# Patient Record
Sex: Female | Born: 1955 | Hispanic: No | Marital: Married | Smoking: Never smoker
Health system: Southern US, Community
[De-identification: ages and names within clinical notes are randomized; demographics above are authoritative.]

## PROBLEM LIST (undated history)

## (undated) DIAGNOSIS — E119 Type 2 diabetes mellitus without complications: Secondary | ICD-10-CM

## (undated) DIAGNOSIS — I10 Essential (primary) hypertension: Secondary | ICD-10-CM

## (undated) DIAGNOSIS — K7460 Unspecified cirrhosis of liver: Secondary | ICD-10-CM

## (undated) DIAGNOSIS — G4730 Sleep apnea, unspecified: Secondary | ICD-10-CM

## (undated) DIAGNOSIS — M1990 Unspecified osteoarthritis, unspecified site: Secondary | ICD-10-CM

## (undated) DIAGNOSIS — K589 Irritable bowel syndrome without diarrhea: Secondary | ICD-10-CM

## (undated) HISTORY — PX: PARTIAL HYSTERECTOMY: SHX80

## (undated) HISTORY — PX: TOTAL KNEE ARTHROPLASTY: SHX125

## (undated) HISTORY — PX: SHOULDER ARTHROTOMY: SUR111

---

## 1999-07-23 ENCOUNTER — Encounter: Payer: Self-pay | Admitting: Emergency Medicine

## 1999-07-23 ENCOUNTER — Emergency Department (HOSPITAL_COMMUNITY): Admission: EM | Admit: 1999-07-23 | Discharge: 1999-07-23 | Payer: Self-pay | Admitting: Emergency Medicine

## 2001-08-15 ENCOUNTER — Emergency Department (HOSPITAL_COMMUNITY): Admission: EM | Admit: 2001-08-15 | Discharge: 2001-08-15 | Payer: Self-pay | Admitting: Emergency Medicine

## 2001-08-15 ENCOUNTER — Encounter: Payer: Self-pay | Admitting: Emergency Medicine

## 2001-09-01 ENCOUNTER — Emergency Department (HOSPITAL_COMMUNITY): Admission: EM | Admit: 2001-09-01 | Discharge: 2001-09-01 | Payer: Self-pay | Admitting: Emergency Medicine

## 2002-08-08 ENCOUNTER — Observation Stay (HOSPITAL_COMMUNITY): Admission: RE | Admit: 2002-08-08 | Discharge: 2002-08-09 | Payer: Self-pay | Admitting: Urology

## 2007-09-28 ENCOUNTER — Emergency Department (HOSPITAL_COMMUNITY): Admission: EM | Admit: 2007-09-28 | Discharge: 2007-09-28 | Payer: Self-pay | Admitting: Family Medicine

## 2008-11-19 ENCOUNTER — Emergency Department (HOSPITAL_BASED_OUTPATIENT_CLINIC_OR_DEPARTMENT_OTHER): Admission: EM | Admit: 2008-11-19 | Discharge: 2008-11-19 | Payer: Self-pay | Admitting: Emergency Medicine

## 2009-08-14 ENCOUNTER — Emergency Department (HOSPITAL_COMMUNITY): Admission: EM | Admit: 2009-08-14 | Discharge: 2009-08-14 | Payer: Self-pay | Admitting: Emergency Medicine

## 2009-08-15 ENCOUNTER — Emergency Department (HOSPITAL_COMMUNITY): Admission: EM | Admit: 2009-08-15 | Discharge: 2009-08-15 | Payer: Self-pay | Admitting: Family Medicine

## 2010-09-23 LAB — COMPREHENSIVE METABOLIC PANEL
AST: 40 U/L — ABNORMAL HIGH (ref 0–37)
Albumin: 3.8 g/dL (ref 3.5–5.2)
BUN: 16 mg/dL (ref 6–23)
Calcium: 8.8 mg/dL (ref 8.4–10.5)
Creatinine, Ser: 1.08 mg/dL (ref 0.4–1.2)
GFR calc Af Amer: >60 mL/min (ref >60)
Total Bilirubin: 3.8 mg/dL — ABNORMAL HIGH (ref 0.3–1.2)
Total Protein: 7.3 g/dL (ref 6.0–8.3)

## 2010-09-23 LAB — CBC
HCT: 37.2 % (ref 36.0–46.0)
Hemoglobin: 12.9 g/dL (ref 12.0–15.0)
MCHC: 34.7 g/dL (ref 30.0–36.0)
MCV: 79 fL (ref 78.0–100.0)
Platelets: 67 10*3/uL — ABNORMAL LOW (ref 150–400)
RBC: 4.71 MIL/uL (ref 3.87–5.11)
RDW: 14.6 % (ref 11.5–15.5)
WBC: 8.5 10*3/uL (ref 4.0–10.5)

## 2010-09-23 LAB — URINE CULTURE: Colony Count: >=100000

## 2010-09-23 LAB — POCT I-STAT, CHEM 8
BUN: 17 mg/dL (ref 6–23)
Calcium, Ion: 1.14 mmol/L (ref 1.12–1.32)
Chloride: 105 mEq/L (ref 96–112)
Creatinine, Ser: 1.2 mg/dL (ref 0.4–1.2)
Glucose, Bld: 180 mg/dL — ABNORMAL HIGH (ref 70–99)
HCT: 40 % (ref 36.0–46.0)
Hemoglobin: 13.6 g/dL (ref 12.0–15.0)
Potassium: 3.9 mEq/L (ref 3.5–5.1)
Sodium: 136 mEq/L (ref 135–145)
TCO2: 23 mmol/L (ref 0–100)

## 2010-09-23 LAB — POCT URINALYSIS DIP (DEVICE)
Bilirubin Urine: NEGATIVE
Glucose, UA: NEGATIVE mg/dL
Nitrite: POSITIVE — AB
Protein, ur: >=300 mg/dL — AB
Specific Gravity, Urine: 1.025 (ref 1.005–1.030)
Urobilinogen, UA: 2 mg/dL — ABNORMAL HIGH (ref 0.0–1.0)
pH: 6.5 (ref 5.0–8.0)

## 2010-11-20 NOTE — Op Note (Signed)
[name], [name]                           ACCOUNT NO.:  [id number]   MEDICAL RECORD NO.:  [id number]                   PATIENT TYPE:  AMB   LOCATION:  DAY                                  FACILITY:  [facility]   PHYSICIAN:  Valetta Fuller, M.D.               DATE OF BIRTH:  [birth year]   DATE OF PROCEDURE:  08/08/2002  DATE OF DISCHARGE:                                 OPERATIVE REPORT   PREOPERATIVE DIAGNOSES:  1. Grade 3 cystocele.  2. Stress urinary incontinence.   POSTOPERATIVE DIAGNOSES:  1. Grade 3 cystocele.  2. Stress urinary incontinence.   PROCEDURES:  1. Anterior repair.  2. SPARC pubovaginal sling.  3. Flexible cystoscopy.   SURGEON:  Valetta Fuller, M.D.   ANESTHESIA:  General.   INDICATIONS:  The patient is a 55 year old female.  We saw her with  complaints of severe stress incontinence.  She had had a hysterectomy and  what sounded like a probable Burch procedure eight to 10 years ago.  Over  the last year she has had a significant change in her voiding patterns.  She  has fairly severe frequency and can go as often as every 15-30 minutes.  She  also gets up three to four times at night.  She also complained of a fairly  significant amount of stress leakage and wore pads on a constant basis.  She  had been tried on a variety of antispasmodics without significant  improvement.  She had a history of heavy caffeine consumption but reported  that limiting that did not seem to help a lot.  When I examined her, I found  her to have a grade 3 cystocele with urethral hypermobility.  She had some  sensory urgency but really had very severe stress incontinence.  We  discussed at length with the patient her options.  She was told that given  the severity of her stress incontinence, she was certainly a candidate for  anterior repair with a pubovaginal sling.  She was told that by improving  the stress incontinence, it is not unusual for some of the frequency and  urgency to improve and often it will not, and there are some rare situations  where it will actually worsen.  The patient appeared to understand those  issues.  She, however, had been tried on conservative therapy and had  seemingly failed.  She was really quite miserable with her situation and was  really quite anxious to have her procedure.  The patient appeared to  understand the success rate, the potential for the complications and  problems, etc.  Full and informed consent was obtained.   DESCRIPTION OF PROCEDURE:  The patient was brought to the operating room,  where she had successful induction of general anesthesia.  She was placed in  the midlithotomy position and prepped and draped in the usual manner.  A  Foley  catheter was inserted and the bladder was drained.  A weighted vaginal  speculum was used.  The anterior vaginal mucosa was infiltrated from the  distal urethra back down to the scarred cervical cuff.  The vaginal flaps  were raised, and the vaginal tissues were actually fairly healthy.  Again, a  grade 3 cystocele was encountered.  After completely dissecting out the  bladder from the vaginal wall, we were able to reapproximate the perivesical  fascia, reducing the cystocele very nicely.  This was done with a variety of  horizontal mattress 2-0 Vicryl sutures.  Once we had the cystocele reduced  we turned our attention suprapubically, where two small stab incisions were  made several centimeters from the midline.  The [facility] needles were then  placed directly behind the pubic symphysis with fingertip control guiding  out the endopelvic fascia and out the vaginal incisions.  Once the needles  were in position, cystoscopy was performed, which showed the needles to be  in very nice position at the bladder neck with no evidence of bladder  injury.  The sling was then brought up and placed at the midurethra.  A  right angle clamp was used underneath the sling, and the tension  was  adjusted.  The sheath was cut in the standard manner and the sling was  trimmed.  I went ahead and did repeat cystoscopy and could see blue dye from  both orifices.  Again there was no evidence of any bladder injury.  Some  redundant vaginal mucosa was trimmed.  The vaginal incision was copiously  irrigated with antibiotic solution.  The vaginal incision was then closed  with a running 2-0 Vicryl suture.  Some vaginal packing was applied and the  Foley catheter had been reinserted.  The patient appeared to tolerate the  procedure very well.  There were no obvious complications.                                                Valetta Fuller, M.D.    DSG/MEDQ  D:  08/08/2002  T:  08/08/2002  Job:  [id number]

## 2011-07-06 ENCOUNTER — Encounter: Payer: Self-pay | Admitting: *Deleted

## 2011-07-06 ENCOUNTER — Emergency Department (INDEPENDENT_AMBULATORY_CARE_PROVIDER_SITE_OTHER)
Admission: EM | Admit: 2011-07-06 | Discharge: 2011-07-06 | Disposition: A | Discharge status: Home / Self Care | Payer: Managed Care, Other (non HMO) | Source: Home / Self Care | Attending: Family Medicine | Admitting: Family Medicine

## 2011-07-06 DIAGNOSIS — J069 Acute upper respiratory infection, unspecified: Secondary | ICD-10-CM

## 2011-07-06 HISTORY — DX: Irritable bowel syndrome, unspecified: K58.9

## 2011-07-06 MED ORDER — IPRATROPIUM BROMIDE 0.06 % NA SOLN: 2.0000 | Freq: Four times a day (QID) | NASAL | Status: DC

## 2011-07-06 MED ORDER — GUAIFENESIN-CODEINE 100-10 MG/5ML PO SYRP: 10.0000 mL | ORAL_SOLUTION | Freq: Four times a day (QID) | ORAL | Status: AC | PRN

## 2011-07-06 MED ORDER — AZITHROMYCIN 250 MG PO TABS: ORAL_TABLET | ORAL | Status: AC

## 2011-07-06 NOTE — ED Notes (Signed)
Pt with c/o sinus congestion/sorethroat/bodyaches and headache x 4 days

## 2011-07-06 NOTE — ED Provider Notes (Signed)
History     CSN: [id number]  Arrival date & time 07/06/11  1017   First MD Initiated Contact with Patient 07/06/11 1043      Chief Complaint  Patient presents with  . Sore Throat  . Nasal Congestion  . Headache    (Consider location/radiation/quality/duration/timing/severity/associated sxs/prior treatment) Patient is a 56 y.o. female presenting with URI. The history is provided by the patient.  URI The primary symptoms include sore throat and cough. The current episode started 3 to 5 days ago. This is a new problem. The problem has been gradually worsening.  Symptoms associated with the illness include congestion and rhinorrhea.    No past medical history on file.  No past surgical history on file.  No family history on file.  History  Substance Use Topics  . Smoking status: Not on file  . Smokeless tobacco: Not on file  . Alcohol Use: Not on file    OB History    Grav Para Term Preterm Abortions TAB SAB Ect Mult Living                  Review of Systems  Constitutional: Negative.   HENT: Positive for congestion, sore throat, rhinorrhea and postnasal drip.   Respiratory: Positive for cough. Negative for shortness of breath.   Gastrointestinal: Negative.   Skin: Negative.     Allergies  Review of patient's allergies indicates no known allergies.  Home Medications   Current Outpatient Rx  Name Route Sig Dispense Refill  . ASPIRIN-ACETAMINOPHEN-CAFFEINE 250-250-65 MG PO TABS Oral Take 1 tablet by mouth every 6 (six) hours as needed.      Marland Kitchen NIGHT TIME COLD/FLU RELIEF PO Oral Take by mouth.      Marland Kitchen PRESCRIPTION MEDICATION  Irritable bowel     . AZITHROMYCIN 250 MG PO TABS  Take as directed on pack 6 each 0  . GUAIFENESIN-CODEINE 100-10 MG/5ML PO SYRP Oral Take 10 mLs by mouth 4 (four) times daily as needed for cough. 180 mL 0  . IPRATROPIUM BROMIDE 0.06 % NA SOLN Nasal Place 2 sprays into the nose 4 (four) times daily. 15 mL 12    BP 127/87  Pulse 80   Temp(Src) 98.9 F (37.2 C) (Oral)  Resp 20  SpO2 100%  Physical Exam  Nursing note and vitals reviewed. Constitutional: She is oriented to person, place, and time. She appears well-developed and well-nourished.  HENT:  Head: Normocephalic.  Right Ear: External ear normal.  Left Ear: External ear normal.  Nose: Nose normal.  Mouth/Throat: Oropharynx is clear and moist.  Eyes: Pupils are equal, round, and reactive to light.  Neck: Normal range of motion. Neck supple.  Cardiovascular: Normal rate, regular rhythm, normal heart sounds and intact distal pulses.   Pulmonary/Chest: Effort normal and breath sounds normal.  Neurological: She is alert and oriented to person, place, and time.  Skin: Skin is warm and dry.    ED Course  Procedures (including critical care time)  Labs Reviewed - No data to display No results found.   1. URI (upper respiratory infection)       MDM          Barkley Bruns, MD 07/06/11 1210

## 2012-02-01 DIAGNOSIS — N3944 Nocturnal enuresis: Secondary | ICD-10-CM | POA: Insufficient documentation

## 2012-02-01 DIAGNOSIS — R358 Other polyuria: Secondary | ICD-10-CM | POA: Insufficient documentation

## 2012-02-01 DIAGNOSIS — N3946 Mixed incontinence: Secondary | ICD-10-CM | POA: Insufficient documentation

## 2012-04-14 ENCOUNTER — Emergency Department (HOSPITAL_BASED_OUTPATIENT_CLINIC_OR_DEPARTMENT_OTHER)
Admission: EM | Admit: 2012-04-14 | Discharge: 2012-04-14 | Disposition: A | Discharge status: Home / Self Care | Payer: Managed Care, Other (non HMO) | Attending: Emergency Medicine | Admitting: Emergency Medicine

## 2012-04-14 ENCOUNTER — Encounter (HOSPITAL_BASED_OUTPATIENT_CLINIC_OR_DEPARTMENT_OTHER): Payer: Self-pay | Admitting: *Deleted

## 2012-04-14 DIAGNOSIS — IMO0002 Reserved for concepts with insufficient information to code with codable children: Secondary | ICD-10-CM | POA: Insufficient documentation

## 2012-04-14 DIAGNOSIS — L02411 Cutaneous abscess of right axilla: Secondary | ICD-10-CM

## 2012-04-14 MED ORDER — LIDOCAINE HCL 2 % IJ SOLN
5.0000 mL | Freq: Once | INTRAMUSCULAR | Status: AC
  Administered 2012-04-14: 100 mg
  Filled 2012-04-14: qty 20

## 2012-04-14 MED ORDER — SULFAMETHOXAZOLE-TMP DS 800-160 MG PO TABS
1.0000 | ORAL_TABLET | Freq: Once | ORAL | Status: AC
  Administered 2012-04-14: 1 via ORAL
  Filled 2012-04-14: qty 1

## 2012-04-14 MED ORDER — OXYCODONE-ACETAMINOPHEN 5-325 MG PO TABS
2.0000 | ORAL_TABLET | Freq: Once | ORAL | Status: AC
  Administered 2012-04-14: 2 via ORAL
  Filled 2012-04-14 (×2): qty 2

## 2012-04-14 MED ORDER — OXYCODONE-ACETAMINOPHEN 5-325 MG PO TABS: 1.0000 | ORAL_TABLET | Freq: Four times a day (QID) | ORAL | Status: DC | PRN

## 2012-04-14 MED ORDER — SULFAMETHOXAZOLE-TRIMETHOPRIM 800-160 MG PO TABS: 1.0000 | ORAL_TABLET | Freq: Two times a day (BID) | ORAL | Status: AC

## 2012-04-14 NOTE — ED Provider Notes (Signed)
History     CSN: [id number]  Arrival date & time 04/14/12  1336   First MD Initiated Contact with Patient 04/14/12 1346      Chief Complaint  Patient presents with  . Abscess    (Consider location/radiation/quality/duration/timing/severity/associated sxs/prior treatment) HPI Pt reports 2 days of increasing sharp/aching pain and swelling in R axilla, no drainage, no fever no prior history of same.   Past Medical History  Diagnosis Date  . Irritable bowel     Past Surgical History  Procedure Date  . Total knee arthroplasty   . Shoulder arthrotomy     No family history on file.  History  Substance Use Topics  . Smoking status: Never Smoker   . Smokeless tobacco: Not on file  . Alcohol Use: No    OB History    Grav Para Term Preterm Abortions TAB SAB Ect Mult Living                  Review of Systems All other systems reviewed and are negative except as noted in HPI.   Allergies  Review of patient's allergies indicates no known allergies.  Home Medications   Current Outpatient Rx  Name Route Sig Dispense Refill  . ASPIRIN-ACETAMINOPHEN-CAFFEINE 250-250-65 MG PO TABS Oral Take 1 tablet by mouth every 6 (six) hours as needed.      Marland Kitchen NIGHT TIME COLD/FLU RELIEF PO Oral Take by mouth.      . IPRATROPIUM BROMIDE 0.06 % NA SOLN Nasal Place 2 sprays into the nose 4 (four) times daily. 15 mL 12  . PRESCRIPTION MEDICATION  Irritable bowel       BP 148/71  Pulse 67  Temp 97.6 F (36.4 C) (Oral)  Resp 20  SpO2 100%  Physical Exam  Nursing note and vitals reviewed. Constitutional: She is oriented to person, place, and time. She appears well-developed and well-nourished.  HENT:  Head: Normocephalic and atraumatic.  Eyes: EOM are normal. Pupils are equal, round, and reactive to light.  Neck: Normal range of motion. Neck supple.  Cardiovascular: Normal rate, normal heart sounds and intact distal pulses.   Pulmonary/Chest: Effort normal and breath sounds normal.   Abdominal: Bowel sounds are normal. She exhibits no distension. There is no tenderness.  Musculoskeletal: Normal range of motion. She exhibits no edema and no tenderness.  Neurological: She is alert and oriented to person, place, and time. She has normal strength. No cranial nerve deficit or sensory deficit.  Skin: Skin is warm and dry. No rash noted.       2cm x 3cm area of induration and tenderness with moderate surrounding cellulitis; bedside ultrasound reveals small central fluid collection  Psychiatric: She has a normal mood and affect.    ED Course  Procedures (including critical care time)  Labs Reviewed - No data to display No results found.   No diagnosis found.    MDM  INCISION AND DRAINAGE Performed by: Pollyann Savoy. Consent: Verbal consent obtained. Risks and benefits: risks, benefits and alternatives were discussed Time out performed prior to procedure Type: abscess Body area: R axilla Anesthesia: local infiltration Local anesthetic: lidocaine 2% no epinephrine Anesthetic total: 1 ml Complexity: complex Blunt dissection to break up loculations Drainage: purulent Drainage amount: moderate Packing material: iodoform gauze Patient tolerance: Patient tolerated the procedure well with no immediate complications.           Charles B. Bernette Mayers, MD 04/14/12 1447

## 2012-04-14 NOTE — ED Notes (Signed)
Abscess to her right axilla.  

## 2012-04-16 ENCOUNTER — Emergency Department (HOSPITAL_BASED_OUTPATIENT_CLINIC_OR_DEPARTMENT_OTHER)
Admission: EM | Admit: 2012-04-16 | Discharge: 2012-04-16 | Disposition: A | Discharge status: Home / Self Care | Payer: Managed Care, Other (non HMO) | Attending: Emergency Medicine | Admitting: Emergency Medicine

## 2012-04-16 ENCOUNTER — Encounter (HOSPITAL_BASED_OUTPATIENT_CLINIC_OR_DEPARTMENT_OTHER): Payer: Self-pay | Admitting: *Deleted

## 2012-04-16 DIAGNOSIS — Z4800 Encounter for change or removal of nonsurgical wound dressing: Secondary | ICD-10-CM | POA: Insufficient documentation

## 2012-04-16 DIAGNOSIS — Z5189 Encounter for other specified aftercare: Secondary | ICD-10-CM

## 2012-04-16 DIAGNOSIS — IMO0002 Reserved for concepts with insufficient information to code with codable children: Secondary | ICD-10-CM | POA: Insufficient documentation

## 2012-04-16 DIAGNOSIS — K589 Irritable bowel syndrome without diarrhea: Secondary | ICD-10-CM | POA: Insufficient documentation

## 2012-04-16 NOTE — ED Provider Notes (Signed)
History     CSN: [id number]  Arrival date & time 04/16/12  1243   First MD Initiated Contact with Patient 04/16/12 1255      Chief Complaint  Patient presents with  . Wound Check    (Consider location/radiation/quality/duration/timing/severity/associated sxs/prior treatment) HPI Comments: Patient is a 56 year old female who presents for wound check of an abscess that was drained 2 days ago. The wound is located in her right axilla. Patient denies any complications or current pain. Patient has been taking antibiotics and keeping area clean. Patient denies fever, NVD, abdominal pain, increased redness/tenderness of incision site.   Patient is a 56 y.o. female presenting with wound check.  Wound Check     Past Medical History  Diagnosis Date  . Irritable bowel     Past Surgical History  Procedure Date  . Total knee arthroplasty   . Shoulder arthrotomy     History reviewed. No pertinent family history.  History  Substance Use Topics  . Smoking status: Never Smoker   . Smokeless tobacco: Not on file  . Alcohol Use: No    OB History    Grav Para Term Preterm Abortions TAB SAB Ect Mult Living                  Review of Systems  Skin: Positive for wound.  All other systems reviewed and are negative.    Allergies  Review of patient's allergies indicates no known allergies.  Home Medications   Current Outpatient Rx  Name Route Sig Dispense Refill  . ASPIRIN-ACETAMINOPHEN-CAFFEINE 250-250-65 MG PO TABS Oral Take 1 tablet by mouth every 6 (six) hours as needed.      Marland Kitchen NIGHT TIME COLD/FLU RELIEF PO Oral Take by mouth.      . IPRATROPIUM BROMIDE 0.06 % NA SOLN Nasal Place 2 sprays into the nose 4 (four) times daily. 15 mL 12  . OXYCODONE-ACETAMINOPHEN 5-325 MG PO TABS Oral Take 1-2 tablets by mouth every 6 (six) hours as needed for pain. 20 tablet 0  . PRESCRIPTION MEDICATION  Irritable bowel     . SULFAMETHOXAZOLE-TRIMETHOPRIM 800-160 MG PO TABS Oral Take 1  tablet by mouth 2 (two) times daily. 14 tablet 0    BP 136/95  Pulse 74  Temp 98.2 F (36.8 C) (Oral)  Resp 18  Ht 5\' 3"  (1.6 m)  Wt 183 lb (83.008 kg)  BMI 32.42 kg/m2  SpO2 96%  Physical Exam  Nursing note and vitals reviewed. Constitutional: She is oriented to person, place, and time. She appears well-developed and well-nourished. No distress.  HENT:  Head: Normocephalic and atraumatic.  Eyes: Conjunctivae normal are normal.  Neck: Normal range of motion. Neck supple.  Cardiovascular: Normal rate and regular rhythm.  Exam reveals no gallop and no friction rub.   No murmur heard. Pulmonary/Chest: Effort normal and breath sounds normal. She has no wheezes. She has no rales. She exhibits no tenderness.  Abdominal: Soft. There is no tenderness.  Musculoskeletal: Normal range of motion.  Neurological: She is alert and oriented to person, place, and time. Coordination normal.       Speech is goal-oriented. Moves limbs without ataxia.   Skin: Skin is warm and dry.       Incision site in right axilla with packing intact. No surrounding erythema or tenderness to palpation.   Psychiatric: She has a normal mood and affect. Her behavior is normal.    ED Course  Procedures (including critical care time)  Labs Reviewed - No data to display No results found.   1. Wound check, abscess       MDM  1:27 PM Patient's wound healing without complications. Packing removed. Patient feels much better. No need to repack. Wound re-dressed. Patient instructed to keep area clean and return with worsening or concerning symptoms. Patient will continue to take antibiotics.         Emilia Beck, PA-C 04/17/12 [phone]

## 2012-04-16 NOTE — ED Notes (Signed)
Pt presents to ED today Pt here for dressing change/wound check to right axilla. Pt does NOT want any numbing or pain medication

## 2012-04-17 NOTE — ED Provider Notes (Signed)
Medical screening examination/treatment/procedure(s) were performed by non-physician practitioner and as supervising physician I was immediately available for consultation/collaboration.   Celene Kras, MD 04/17/12 [phone]

## 2012-04-28 DIAGNOSIS — IMO0002 Reserved for concepts with insufficient information to code with codable children: Secondary | ICD-10-CM | POA: Insufficient documentation

## 2013-06-22 DIAGNOSIS — I10 Essential (primary) hypertension: Secondary | ICD-10-CM | POA: Insufficient documentation

## 2013-10-01 ENCOUNTER — Encounter (HOSPITAL_BASED_OUTPATIENT_CLINIC_OR_DEPARTMENT_OTHER): Payer: Self-pay | Admitting: Emergency Medicine

## 2013-10-01 ENCOUNTER — Emergency Department (HOSPITAL_BASED_OUTPATIENT_CLINIC_OR_DEPARTMENT_OTHER)
Admission: EM | Admit: 2013-10-01 | Discharge: 2013-10-01 | Disposition: A | Discharge status: Home / Self Care | Payer: Non-veteran care | Attending: Emergency Medicine | Admitting: Emergency Medicine

## 2013-10-01 DIAGNOSIS — Z79899 Other long term (current) drug therapy: Secondary | ICD-10-CM | POA: Insufficient documentation

## 2013-10-01 DIAGNOSIS — I10 Essential (primary) hypertension: Secondary | ICD-10-CM | POA: Insufficient documentation

## 2013-10-01 DIAGNOSIS — Z8719 Personal history of other diseases of the digestive system: Secondary | ICD-10-CM | POA: Insufficient documentation

## 2013-10-01 DIAGNOSIS — N764 Abscess of vulva: Secondary | ICD-10-CM

## 2013-10-01 HISTORY — DX: Essential (primary) hypertension: I10

## 2013-10-01 MED ORDER — CLINDAMYCIN HCL 300 MG PO CAPS: 300.0000 mg | ORAL_CAPSULE | Freq: Three times a day (TID) | ORAL | Status: DC

## 2013-10-01 MED ORDER — HYDROCODONE-ACETAMINOPHEN 5-325 MG PO TABS: 1.0000 | ORAL_TABLET | ORAL | Status: DC | PRN

## 2013-10-01 NOTE — ED Provider Notes (Signed)
CSN: [id number]     Arrival date & time 10/01/13  1021 History   First MD Initiated Contact with Patient 10/01/13 1027     Chief Complaint  Patient presents with  . blister in vaginal area      (Consider location/radiation/quality/duration/timing/severity/associated sxs/prior Treatment) HPI Comments: Pt states that she has a bump in her vaginal area and it is tender and swollen. Pt states that she has had similar problems previously. Denies vaginal discharge or dysuria. No fever  The history is provided by the patient. No language interpreter was used.    Past Medical History  Diagnosis Date  . Irritable bowel   . Hypertension    Past Surgical History  Procedure Laterality Date  . Total knee arthroplasty    . Shoulder arthrotomy     History reviewed. No pertinent family history. History  Substance Use Topics  . Smoking status: Never Smoker   . Smokeless tobacco: Not on file  . Alcohol Use: No   OB History   Grav Para Term Preterm Abortions TAB SAB Ect Mult Living                 Review of Systems  Constitutional: Negative.   Respiratory: Negative.   Cardiovascular: Negative.       Allergies  Review of patient's allergies indicates no known allergies.  Home Medications   Current Outpatient Rx  Name  Route  Sig  Dispense  Refill  . aspirin-acetaminophen-caffeine (EXCEDRIN MIGRAINE) 250-250-65 MG per tablet   Oral   Take 1 tablet by mouth every 6 (six) hours as needed.           . clindamycin (CLEOCIN) 300 MG capsule   Oral   Take 1 capsule (300 mg total) by mouth 3 (three) times daily.   21 capsule   0   . DM-Doxylamine-Acetaminophen (NIGHT TIME COLD/FLU RELIEF PO)   Oral   Take by mouth.           Marland Kitchen. HYDROcodone-acetaminophen (NORCO/VICODIN) 5-325 MG per tablet   Oral   Take 1-2 tablets by mouth every 4 (four) hours as needed.   10 tablet   0   . EXPIRED: ipratropium (ATROVENT) 0.06 % nasal spray   Nasal   Place 2 sprays into the nose 4  (four) times daily.   15 mL   12   . oxyCODONE-acetaminophen (PERCOCET/ROXICET) 5-325 MG per tablet   Oral   Take 1-2 tablets by mouth every 6 (six) hours as needed for pain.   20 tablet   0   . PRESCRIPTION MEDICATION      Irritable bowel           BP 170/109  Pulse 67  Temp(Src) 97.9 F (36.6 C) (Oral)  Resp 18  Ht 5\' 3"  (1.6 m)  Wt 198 lb (89.812 kg)  BMI 35.08 kg/m2  SpO2 100% Physical Exam  Nursing note and vitals reviewed. Constitutional: She is oriented to person, place, and time. She appears well-developed and well-nourished.  Cardiovascular: Normal rate and regular rhythm.   Pulmonary/Chest: Effort normal and breath sounds normal.  Musculoskeletal: Normal range of motion.  Neurological: She is alert and oriented to person, place, and time.  Skin:  Right upper labia, red tender and swollen  Psychiatric: She has a normal mood and affect.    ED Course  INCISION AND DRAINAGE Date/Time: 10/01/2013 11:15 AM Performed by: Teressa LowerPICKERING, Velina Drollinger Authorized by: Teressa LowerPICKERING, Ramon Brant Consent: Verbal consent obtained. Risks and benefits: risks, benefits and  alternatives were discussed Consent given by: patient Patient identity confirmed: verbally with patient Type: abscess Anesthesia: local infiltration Local anesthetic: lidocaine 2% without epinephrine Scalpel size: 11 Incision type: single straight Complexity: simple Drainage: purulent Drainage amount: scant Patient tolerance: Patient tolerated the procedure well with no immediate complications.   (including critical care time) Labs Review Labs Reviewed - No data to display Imaging Review No results found.   EKG Interpretation None      MDM   Final diagnoses:  Labial abscess    Will treat with clinda and hydrocodone:tetanus is utd    Teressa Lower, NP 10/01/13 1116

## 2013-10-01 NOTE — ED Notes (Signed)
Boil on labia x 4 days

## 2013-10-01 NOTE — Discharge Instructions (Signed)
Abscess An abscess is an infected area that contains a collection of pus and debris.It can occur in almost any part of the body. An abscess is also known as a furuncle or boil. CAUSES  An abscess occurs when tissue gets infected. This can occur from blockage of oil or sweat glands, infection of hair follicles, or a minor injury to the skin. As the body tries to fight the infection, pus collects in the area and creates pressure under the skin. This pressure causes pain. People with weakened immune systems have difficulty fighting infections and get certain abscesses more often.  SYMPTOMS Usually an abscess develops on the skin and becomes a painful mass that is red, warm, and tender. If the abscess forms under the skin, you may feel a moveable soft area under the skin. Some abscesses break open (rupture) on their own, but most will continue to get worse without care. The infection can spread deeper into the body and eventually into the bloodstream, causing you to feel ill.  DIAGNOSIS  Your caregiver will take your medical history and perform a physical exam. A sample of fluid may also be taken from the abscess to determine what is causing your infection. TREATMENT  Your caregiver may prescribe antibiotic medicines to fight the infection. However, taking antibiotics alone usually does not cure an abscess. Your caregiver may need to make a small cut (incision) in the abscess to drain the pus. In some cases, gauze is packed into the abscess to reduce pain and to continue draining the area. HOME CARE INSTRUCTIONS   Only take over-the-counter or prescription medicines for pain, discomfort, or fever as directed by your caregiver.  If you were prescribed antibiotics, take them as directed. Finish them even if you start to feel better.  If gauze is used, follow your caregiver's directions for changing the gauze.  To avoid spreading the infection:  Keep your draining abscess covered with a  bandage.  Wash your hands well.  Do not share personal care items, towels, or whirlpools with others.  Avoid skin contact with others.  Keep your skin and clothes clean around the abscess.  Keep all follow-up appointments as directed by your caregiver. SEEK MEDICAL CARE IF:   You have increased pain, swelling, redness, fluid drainage, or bleeding.  You have muscle aches, chills, or a general ill feeling.  You have a fever. MAKE SURE YOU:   Understand these instructions.  Will watch your condition.  Will get help right away if you are not doing well or get worse. Document Released: 03/31/2005 Document Revised: 12/21/2011 Document Reviewed: 09/03/2011 [employer] Patient Information 2014 [employer].  

## 2013-10-02 NOTE — ED Provider Notes (Signed)
Medical screening examination/treatment/procedure(s) were performed by non-physician practitioner and as supervising physician I was immediately available for consultation/collaboration.   EKG Interpretation None        Sarrah Fiorenza W. Marsi Turvey, MD 10/02/13 0735 

## 2017-05-06 ENCOUNTER — Emergency Department (HOSPITAL_COMMUNITY): Payer: Medicare HMO

## 2017-05-06 ENCOUNTER — Emergency Department (HOSPITAL_COMMUNITY)
Admission: EM | Admit: 2017-05-06 | Discharge: 2017-05-07 | Disposition: A | Discharge status: Home / Self Care | Payer: Medicare HMO | Attending: Emergency Medicine | Admitting: Emergency Medicine

## 2017-05-06 ENCOUNTER — Encounter (HOSPITAL_COMMUNITY): Payer: Self-pay | Admitting: Emergency Medicine

## 2017-05-06 DIAGNOSIS — R1110 Vomiting, unspecified: Secondary | ICD-10-CM | POA: Insufficient documentation

## 2017-05-06 DIAGNOSIS — Z7984 Long term (current) use of oral hypoglycemic drugs: Secondary | ICD-10-CM | POA: Diagnosis not present

## 2017-05-06 DIAGNOSIS — I10 Essential (primary) hypertension: Secondary | ICD-10-CM | POA: Insufficient documentation

## 2017-05-06 DIAGNOSIS — E119 Type 2 diabetes mellitus without complications: Secondary | ICD-10-CM | POA: Diagnosis not present

## 2017-05-06 DIAGNOSIS — R110 Nausea: Secondary | ICD-10-CM | POA: Diagnosis not present

## 2017-05-06 DIAGNOSIS — Z96659 Presence of unspecified artificial knee joint: Secondary | ICD-10-CM | POA: Diagnosis not present

## 2017-05-06 DIAGNOSIS — Z79899 Other long term (current) drug therapy: Secondary | ICD-10-CM | POA: Diagnosis not present

## 2017-05-06 DIAGNOSIS — K209 Esophagitis, unspecified without bleeding: Secondary | ICD-10-CM

## 2017-05-06 DIAGNOSIS — R079 Chest pain, unspecified: Secondary | ICD-10-CM | POA: Diagnosis present

## 2017-05-06 DIAGNOSIS — R109 Unspecified abdominal pain: Secondary | ICD-10-CM | POA: Insufficient documentation

## 2017-05-06 DIAGNOSIS — R42 Dizziness and giddiness: Secondary | ICD-10-CM | POA: Insufficient documentation

## 2017-05-06 DIAGNOSIS — R0602 Shortness of breath: Secondary | ICD-10-CM | POA: Diagnosis not present

## 2017-05-06 HISTORY — DX: Type 2 diabetes mellitus without complications: E11.9

## 2017-05-06 LAB — BASIC METABOLIC PANEL
ANION GAP: 8 (ref 5–15)
BUN: 19 mg/dL (ref 6–20)
CO2: 22 mmol/L (ref 22–32)
Calcium: 8.7 mg/dL — ABNORMAL LOW (ref 8.9–10.3)
Chloride: 110 mmol/L (ref 101–111)
Creatinine, Ser: 0.85 mg/dL (ref 0.44–1.00)
GFR calc Af Amer: >60 mL/min (ref >60)
GLUCOSE: 178 mg/dL — AB (ref 65–99)
POTASSIUM: 3.5 mmol/L (ref 3.5–5.1)
Sodium: 140 mmol/L (ref 135–145)

## 2017-05-06 LAB — CBC
HEMATOCRIT: 32.2 % — AB (ref 36.0–46.0)
HEMOGLOBIN: 10.7 g/dL — AB (ref 12.0–15.0)
MCH: 25.2 pg — ABNORMAL LOW (ref 26.0–34.0)
MCHC: 33.2 g/dL (ref 30.0–36.0)
MCV: 75.8 fL — ABNORMAL LOW (ref 78.0–100.0)
Platelets: 72 10*3/uL — ABNORMAL LOW (ref 150–400)
RBC: 4.25 MIL/uL (ref 3.87–5.11)
RDW: 16.1 % — ABNORMAL HIGH (ref 11.5–15.5)
WBC: 3.4 10*3/uL — ABNORMAL LOW (ref 4.0–10.5)

## 2017-05-06 LAB — POCT I-STAT TROPONIN I: Troponin i, poc: 0 ng/mL (ref 0.00–0.08)

## 2017-05-06 MED ORDER — MECLIZINE HCL 25 MG PO TABS
25.0000 mg | ORAL_TABLET | Freq: Once | ORAL | Status: AC
  Administered 2017-05-06: 25 mg via ORAL
  Filled 2017-05-06: qty 1

## 2017-05-06 MED ORDER — ONDANSETRON HCL 4 MG/2ML IJ SOLN: 4.0000 mg | Freq: Once | INTRAMUSCULAR | Status: DC

## 2017-05-06 MED ORDER — SODIUM CHLORIDE 0.9 % IV BOLUS (SEPSIS)
1000.0000 mL | Freq: Once | INTRAVENOUS | Status: AC
  Administered 2017-05-06: 1000 mL via INTRAVENOUS

## 2017-05-06 MED ORDER — METOCLOPRAMIDE HCL 5 MG/ML IJ SOLN
10.0000 mg | Freq: Once | INTRAMUSCULAR | Status: AC
  Administered 2017-05-06: 10 mg via INTRAVENOUS
  Filled 2017-05-06: qty 2

## 2017-05-06 MED ORDER — DIPHENHYDRAMINE HCL 50 MG/ML IJ SOLN
25.0000 mg | Freq: Once | INTRAMUSCULAR | Status: AC
  Administered 2017-05-06: 25 mg via INTRAVENOUS
  Filled 2017-05-06: qty 1

## 2017-05-06 NOTE — ED Provider Notes (Signed)
[facility]-EMERGENCY DEPT Provider Note   CSN: [id number] Arrival date & time: 05/06/17  1936     History   Chief Complaint Chief Complaint  Patient presents with  . Chest Pain    HPI [name] is a [age] y.o. female hx of DM, HTN, IBS, who presented with dizziness, chest pain, shortness of breath.  Patient states that she has been feeling lightheaded and dizzy for the last several days.  She felt that the room was spinning and sometimes she felt like she was going to pass out.  Patient states that she has some subjective chest pain and shortness of breath for last several days, just feels like she has trouble taking deep breath.  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  She got out of the airplane and really feels lightheaded dizzy and the room was spinning and very nauseated.  She states that she has a history of migraines but she usually takes Excedrin and goes away but today it did not.  She called EMS and was recommended to come here for evaluation.  Patient denies any previous strokes or history of blood clots or coronary artery disease.   The history is provided by the patient.    Past Medical History:  Diagnosis Date  . Diabetes mellitus without complication (HCC)   . Hypertension   . Irritable bowel     There are no active problems to display for this patient.   Past Surgical History:  Procedure Laterality Date  . SHOULDER ARTHROTOMY    . TOTAL KNEE ARTHROPLASTY      OB History    No data available       Home Medications    Prior to Admission medications   Medication Sig Start Date End Date Taking? Authorizing Provider  CALCIUM PO Take 1 tablet by mouth 2 (two) times daily.   Yes [provider]  ibuprofen (ADVIL,MOTRIN) 200 MG tablet Take 400 mg by mouth every 6 (six) hours as needed for headache.   Yes [provider]  loperamide (LOPERAMIDE A-D) 2 MG tablet Take  2 mg by mouth 2 (two) times daily as needed for diarrhea or loose stools.   Yes [provider]  METFORMIN HCL PO Take 2 tablets by mouth 2 (two) times daily.   Yes [provider]  POTASSIUM PO Take 1 tablet by mouth daily.   Yes [provider]  PRESCRIPTION MEDICATION Take 1 tablet by mouth 2 (two) times daily.   Yes [provider]    Family History No family history on file.  Social History Social History  Substance Use Topics  . Smoking status: Never Smoker  . Smokeless tobacco: Not on file  . Alcohol use No     Allergies   Codeine and Lactose intolerance (gi)   Review of Systems Review of Systems  Respiratory: Positive for shortness of breath.   Cardiovascular: Positive for chest pain.  Neurological: Positive for dizziness.  All other systems reviewed and are negative.    Physical Exam Updated Vital Signs BP (!) 147/75 (BP Location: Right Arm)   Pulse 65   Temp 98.1 F (36.7 C) (Oral)   Resp 14   Ht 5\' 3"  (1.6 m)   Wt 77.1 kg (170 lb)   SpO2 97%   BMI 30.11 kg/m   Physical Exam  Constitutional: She is oriented to person, place, and time. She appears well-developed.  HENT:  Head: Normocephalic.  Mouth/Throat: Oropharynx is clear and moist.  Eyes:  Some R horizontal nystagmus, no rotatory nystagmus   Neck: Normal range of motion. Neck supple.  Cardiovascular: Normal rate, regular rhythm and normal heart sounds.   Pulmonary/Chest: Effort normal and breath sounds normal. No respiratory distress. She has no wheezes. She has no rales.  Abdominal: Soft. Bowel sounds are normal. She exhibits no distension. There is no tenderness. There is no guarding.  Musculoskeletal: Normal range of motion. She exhibits no edema.  Neurological: She is alert and oriented to person, place, and time. She displays normal reflexes. No cranial nerve deficit. Coordination normal.  Skin: Skin is warm.  Psychiatric: She has a normal mood and  affect.  Nursing note and vitals reviewed.    ED Treatments / Results  Labs (all labs ordered are listed, but only abnormal results are displayed) Labs Reviewed  BASIC METABOLIC PANEL - Abnormal; Notable for the following:       Result Value   Glucose, Bld 178 (*)    Calcium 8.7 (*)    All other components within normal limits  CBC - Abnormal; Notable for the following:    WBC 3.4 (*)    Hemoglobin 10.7 (*)    HCT 32.2 (*)    MCV 75.8 (*)    MCH 25.2 (*)    RDW 16.1 (*)    Platelets 72 (*)    All other components within normal limits  HEPATIC FUNCTION PANEL - Abnormal; Notable for the following:    AST 47 (*)    All other components within normal limits  D-DIMER, QUANTITATIVE (NOT AT [facility]) - Abnormal; Notable for the following:    D-Dimer, Quant 0.76 (*)    All other components within normal limits  LIPASE, BLOOD - Abnormal; Notable for the following:    Lipase 62 (*)    All other components within normal limits  I-STAT TROPONIN, ED  POCT I-STAT TROPONIN I  I-STAT TROPONIN, ED  POCT I-STAT TROPONIN I    EKG  EKG Interpretation  Date/Time:  Friday May 06 2017 19:42:02 EDT Ventricular Rate:  70 PR Interval:    QRS Duration: 91 QT Interval:  431 QTC Calculation: 466 R Axis:   2 Text Interpretation:  Sinus rhythm LVH with secondary repolarization abnormality Anterior infarct, old Baseline wander in lead(s) V2 No previous ECGs available Confirmed by Richardean Canal [phone]) on 05/06/2017 11:11:21 PM       Radiology Dg Chest 2 View  Result Date: 05/06/2017 CLINICAL DATA:  Patient with left-sided chest pain and pressure for 3 days. EXAM: CHEST  2 VIEW COMPARISON:  None. FINDINGS: Monitoring leads overlie the patient. Normal cardiac and mediastinal contours. No consolidative pulmonary opacities. No pleural effusion or pneumothorax. Thoracic spine degenerative changes. IMPRESSION: No acute cardiopulmonary process. Electronically Signed   By: Annia Belt M.D.   On:  05/06/2017 20:07   Ct Head Wo Contrast  Result Date: 05/07/2017 CLINICAL DATA:  Chest pressure for 3 days. Migraine headache. Assess TIA. History of hypertension and diabetes. EXAM: CT HEAD WITHOUT CONTRAST TECHNIQUE: Contiguous axial images were obtained from the base of the skull through the vertex without intravenous contrast. COMPARISON:  None. FINDINGS: BRAIN: No intraparenchymal hemorrhage, mass effect nor midline shift. The ventricles and sulci are normal. No acute large vascular territory infarcts. No abnormal extra-axial fluid collections. Basal cisterns are patent. VASCULAR: Unremarkable. SKULL/SOFT TISSUES: No skull fracture. Mild temporomandibular osteoarthrosis. No significant soft tissue swelling. ORBITS/SINUSES: The  included ocular globes and orbital contents are normal.The mastoid aircells and included paranasal sinuses are well-aerated. OTHER: None. IMPRESSION: Normal noncontrast CT HEAD. Electronically Signed   By: Awilda Metro M.D.   On: 05/07/2017 00:36   Ct Angio Chest Pe W And/or Wo Contrast  Result Date: 05/07/2017 CLINICAL DATA:  Left-sided chest pain. PE suspected, high pretest prob; Nausea, vomiting Abd pain, gastroenteritis or colitis suspected. EXAM: CT ANGIOGRAPHY CHEST CT ABDOMEN AND PELVIS WITH CONTRAST TECHNIQUE: Multidetector CT imaging of the chest was performed using the standard protocol during bolus administration of intravenous contrast. Multiplanar CT image reconstructions and MIPs were obtained to evaluate the vascular anatomy. Multidetector CT imaging of the abdomen and pelvis was performed using the standard protocol during bolus administration of intravenous contrast. CONTRAST:  100 cc Isovue 370 IV COMPARISON:  Chest radiograph yesterday FINDINGS: CTA CHEST FINDINGS Cardiovascular: There are no filling defects within the pulmonary arteries to suggest pulmonary embolus. Normal caliber thoracic aorta with trace atherosclerosis. Cannot assess for dissection given  phase of contrast. Heart is normal in size. No pericardial fluid. Mediastinum/Nodes: Scattered mediastinal nodes are not enlarged by size criteria. No hilar adenopathy. Mild esophageal wall thickening. Bubbly air posterior to the trachea and to the right of the upper esophagus the level of the thyroid gland is likely a diverticulum. Visualized thyroid gland is normal. Lungs/Pleura: Hypoventilatory atelectasis in the lung bases. No consolidation. No pulmonary edema or pleural fluid. No pulmonary mass. Musculoskeletal: Mild upper thoracic scoliosis. There are no acute or suspicious osseous abnormalities. Review of the MIP images confirms the above findings. CT ABDOMEN and PELVIS FINDINGS Hepatobiliary: The liver is enlarged spanning 19.6 cm cranial caudal. Decreased hepatic density consistent with steatosis. Mild contour nodularity. No focal lesion. Gallbladder filled with layering gallstones, no pericholecystic inflammation. No biliary dilatation. Pancreas: No ductal dilatation or inflammation. Spleen: Marked splenomegaly with spleen measuring 20.9 x 16.6 x 7.9 cm (volume = 1400 cm^3). Linear hypodensity posterior in the spleen is likely a cleft, no perisplenic fluid. No focal lesion. Adrenals/Urinary Tract: No adrenal nodule. No hydronephrosis or perinephric edema. Homogeneous renal enhancement. Mild left renal atrophy compared to right with decreased excretion on delayed phase imaging. Urinary bladder is physiologically distended without wall thickening. Stomach/Bowel: Focal hyperdensity in the distal esophagus. Stomach distended with ingested contents. Fluid adjacent to the second and third portion of the duodenum, may simply represent ascites. No definite duodenum wall thickening. No small bowel inflammation or obstruction. Small to moderate colonic stool burden without colonic wall thickening. Normal appendix. Vascular/Lymphatic: Mild aortic tortuosity. Dilatation of the splenic vein likely due to portal  hypertension. Venous mixing in the SMV, no filling defects seen on delayed phase imaging. Prominent periportal and portacaval nodes measuring 11 mm. Small retroperitoneal and mesenteric nodes. Largest mesenteric node measures 9 mm. Reproductive: Uterus is surgically absent.  No adnexal mass. Other: Small volume of abdominopelvic ascites, greatest volume in the right upper quadrant. There is no free air. No loculated abscess. Musculoskeletal: Sacral stimulator with battery pack in the left subcutaneous tissues. There are no acute or suspicious osseous abnormalities. Review of the MIP images confirms the above findings. IMPRESSION: 1. No pulmonary embolus.  Hypoventilatory changes in the lungs. 2. Enlarged liver with steatosis. Nodular hepatic contours suggests cirrhosis. Marked splenomegaly likely secondary to portal hypertension. Small volume abdominopelvic ascites. 3. Cholelithiasis without signs of gallbladder inflammation. 4. Esophageal wall thickening, suspicious for esophagitis. Focal intraluminal hyperdensity in the distal esophagus is likely ingested material/pill. 5. Mild left renal atrophy.  Electronically Signed   By: Rubye Oaks M.D.   On: 05/07/2017 02:19   Ct Abdomen Pelvis W Contrast  Result Date: 05/07/2017 CLINICAL DATA:  Left-sided chest pain. PE suspected, high pretest prob; Nausea, vomiting Abd pain, gastroenteritis or colitis suspected. EXAM: CT ANGIOGRAPHY CHEST CT ABDOMEN AND PELVIS WITH CONTRAST TECHNIQUE: Multidetector CT imaging of the chest was performed using the standard protocol during bolus administration of intravenous contrast. Multiplanar CT image reconstructions and MIPs were obtained to evaluate the vascular anatomy. Multidetector CT imaging of the abdomen and pelvis was performed using the standard protocol during bolus administration of intravenous contrast. CONTRAST:  100 cc Isovue 370 IV COMPARISON:  Chest radiograph yesterday FINDINGS: CTA CHEST FINDINGS  Cardiovascular: There are no filling defects within the pulmonary arteries to suggest pulmonary embolus. Normal caliber thoracic aorta with trace atherosclerosis. Cannot assess for dissection given phase of contrast. Heart is normal in size. No pericardial fluid. Mediastinum/Nodes: Scattered mediastinal nodes are not enlarged by size criteria. No hilar adenopathy. Mild esophageal wall thickening. Bubbly air posterior to the trachea and to the right of the upper esophagus the level of the thyroid gland is likely a diverticulum. Visualized thyroid gland is normal. Lungs/Pleura: Hypoventilatory atelectasis in the lung bases. No consolidation. No pulmonary edema or pleural fluid. No pulmonary mass. Musculoskeletal: Mild upper thoracic scoliosis. There are no acute or suspicious osseous abnormalities. Review of the MIP images confirms the above findings. CT ABDOMEN and PELVIS FINDINGS Hepatobiliary: The liver is enlarged spanning 19.6 cm cranial caudal. Decreased hepatic density consistent with steatosis. Mild contour nodularity. No focal lesion. Gallbladder filled with layering gallstones, no pericholecystic inflammation. No biliary dilatation. Pancreas: No ductal dilatation or inflammation. Spleen: Marked splenomegaly with spleen measuring 20.9 x 16.6 x 7.9 cm (volume = 1400 cm^3). Linear hypodensity posterior in the spleen is likely a cleft, no perisplenic fluid. No focal lesion. Adrenals/Urinary Tract: No adrenal nodule. No hydronephrosis or perinephric edema. Homogeneous renal enhancement. Mild left renal atrophy compared to right with decreased excretion on delayed phase imaging. Urinary bladder is physiologically distended without wall thickening. Stomach/Bowel: Focal hyperdensity in the distal esophagus. Stomach distended with ingested contents. Fluid adjacent to the second and third portion of the duodenum, may simply represent ascites. No definite duodenum wall thickening. No small bowel inflammation or  obstruction. Small to moderate colonic stool burden without colonic wall thickening. Normal appendix. Vascular/Lymphatic: Mild aortic tortuosity. Dilatation of the splenic vein likely due to portal hypertension. Venous mixing in the SMV, no filling defects seen on delayed phase imaging. Prominent periportal and portacaval nodes measuring 11 mm. Small retroperitoneal and mesenteric nodes. Largest mesenteric node measures 9 mm. Reproductive: Uterus is surgically absent.  No adnexal mass. Other: Small volume of abdominopelvic ascites, greatest volume in the right upper quadrant. There is no free air. No loculated abscess. Musculoskeletal: Sacral stimulator with battery pack in the left subcutaneous tissues. There are no acute or suspicious osseous abnormalities. Review of the MIP images confirms the above findings. IMPRESSION: 1. No pulmonary embolus.  Hypoventilatory changes in the lungs. 2. Enlarged liver with steatosis. Nodular hepatic contours suggests cirrhosis. Marked splenomegaly likely secondary to portal hypertension. Small volume abdominopelvic ascites. 3. Cholelithiasis without signs of gallbladder inflammation. 4. Esophageal wall thickening, suspicious for esophagitis. Focal intraluminal hyperdensity in the distal esophagus is likely ingested material/pill. 5. Mild left renal atrophy. Electronically Signed   By: Rubye Oaks M.D.   On: 05/07/2017 02:19    Procedures Procedures (including critical care time)  Medications Ordered  in ED Medications  iopamidol (ISOVUE-370) 76 % injection (not administered)  pantoprazole (PROTONIX) EC tablet 40 mg (not administered)  sodium chloride 0.9 % bolus 1,000 mL (0 mLs Intravenous Stopped 05/07/17 0000)  meclizine (ANTIVERT) tablet 25 mg (25 mg Oral Given 05/06/17 2355)  metoCLOPramide (REGLAN) injection 10 mg (10 mg Intravenous Given 05/06/17 2353)  diphenhydrAMINE (BENADRYL) injection 25 mg (25 mg Intravenous Given 05/06/17 2354)  iopamidol (ISOVUE-370)  76 % injection 100 mL (100 mLs Intravenous Contrast Given 05/07/17 0111)     Initial Impression / Assessment and Plan / ED Course  I have reviewed the triage vital signs and the nursing notes.  Pertinent labs & imaging results that were available during my care of the patient were reviewed by me and considered in my medical decision making (see chart for details).    [name] is a 61 y.o. female here with shortness of breath, dizziness. Dizziness likely complex migraines vs peripheral vertigo. No weakness or trouble speaking and I have low suspicion for posterior circulation stroke. Consider ACS vs PE as well given chest pain, shortness of breath. Will get trop x 2, d-dimer, CXR. Will give migraine cocktail.   2:27 AM D-dimer mildly elevated. Lipase 62. CT head unremarkable. CT chest/ab/pel showed esophagitis. I think the nausea and chest pain likely from esophagitis. Will start on nexium, refer to GI. Dizziness improved with IVF, meclizine. Will give meclizine as well.    Final Clinical Impressions(s) / ED Diagnoses   Final diagnoses:  None    New Prescriptions New Prescriptions   No medications on file     Charlynne Pander, MD 05/07/17 [phone]

## 2017-05-06 NOTE — ED Triage Notes (Signed)
Patient reports left sided chest pain feeling like something is sitting on her chest for past 3 days. Pt also feeling light headed intermittently. Patient reports having EMT check her out at work today and encouraged to be seen. Patient speaking in full sentences.

## 2017-05-06 NOTE — ED Notes (Signed)
Patient transported to X-ray 

## 2017-05-07 ENCOUNTER — Emergency Department (HOSPITAL_COMMUNITY): Payer: Medicare HMO

## 2017-05-07 ENCOUNTER — Encounter (HOSPITAL_COMMUNITY): Payer: Self-pay | Admitting: Radiology

## 2017-05-07 LAB — HEPATIC FUNCTION PANEL
ALT: 26 U/L (ref 14–54)
AST: 47 U/L — AB (ref 15–41)
Albumin: 3.7 g/dL (ref 3.5–5.0)
Alkaline Phosphatase: 74 U/L (ref 38–126)
Bilirubin, Direct: 0.1 mg/dL (ref 0.1–0.5)
Indirect Bilirubin: 0.9 mg/dL (ref 0.3–0.9)
TOTAL PROTEIN: 6.8 g/dL (ref 6.5–8.1)
Total Bilirubin: 1 mg/dL (ref 0.3–1.2)

## 2017-05-07 LAB — D-DIMER, QUANTITATIVE: D-Dimer, Quant: 0.76 ug/mL-FEU — ABNORMAL HIGH (ref 0.00–0.50)

## 2017-05-07 LAB — LIPASE, BLOOD: Lipase: 62 U/L — ABNORMAL HIGH (ref 11–51)

## 2017-05-07 LAB — POCT I-STAT TROPONIN I: TROPONIN I, POC: 0 ng/mL (ref 0.00–0.08)

## 2017-05-07 MED ORDER — IOPAMIDOL (ISOVUE-370) INJECTION 76%
INTRAVENOUS | Status: AC
  Filled 2017-05-07: qty 100

## 2017-05-07 MED ORDER — ESOMEPRAZOLE MAGNESIUM 40 MG PO CPDR: 40.0000 mg | DELAYED_RELEASE_CAPSULE | Freq: Every day | ORAL | 0 refills | Status: DC

## 2017-05-07 MED ORDER — MECLIZINE HCL 25 MG PO TABS: 25.0000 mg | ORAL_TABLET | Freq: Three times a day (TID) | ORAL | 0 refills | Status: DC | PRN

## 2017-05-07 MED ORDER — PANTOPRAZOLE SODIUM 40 MG PO TBEC
40.0000 mg | DELAYED_RELEASE_TABLET | Freq: Once | ORAL | Status: AC
  Administered 2017-05-07: 40 mg via ORAL
  Filled 2017-05-07: qty 1

## 2017-05-07 MED ORDER — IOPAMIDOL (ISOVUE-370) INJECTION 76%
100.0000 mL | Freq: Once | INTRAVENOUS | Status: AC | PRN
  Administered 2017-05-07: 100 mL via INTRAVENOUS

## 2017-05-07 NOTE — Discharge Instructions (Signed)
Stay hydrated.   Take meclizine as needed for dizziness.   Take nexium daily.   You have inflammed esophagus on your CT scan. You need to see GI doctor for endoscopy   Return to ER if you have worse chest pain, abdominal pain, vomiting, dizziness, weakness, passing out.

## 2017-07-22 DIAGNOSIS — M5136 Other intervertebral disc degeneration, lumbar region: Secondary | ICD-10-CM | POA: Insufficient documentation

## 2018-03-29 DIAGNOSIS — Z96653 Presence of artificial knee joint, bilateral: Secondary | ICD-10-CM | POA: Insufficient documentation

## 2018-04-28 DIAGNOSIS — M25561 Pain in right knee: Secondary | ICD-10-CM | POA: Insufficient documentation

## 2018-09-18 ENCOUNTER — Emergency Department (HOSPITAL_COMMUNITY): Payer: Medicare HMO

## 2018-09-18 ENCOUNTER — Encounter (HOSPITAL_COMMUNITY): Payer: Self-pay

## 2018-09-18 ENCOUNTER — Other Ambulatory Visit: Payer: Self-pay

## 2018-09-18 ENCOUNTER — Emergency Department (HOSPITAL_COMMUNITY)
Admission: EM | Admit: 2018-09-18 | Discharge: 2018-09-18 | Disposition: A | Discharge status: Home / Self Care | Payer: Medicare HMO | Attending: Emergency Medicine | Admitting: Emergency Medicine

## 2018-09-18 DIAGNOSIS — R51 Headache: Secondary | ICD-10-CM | POA: Insufficient documentation

## 2018-09-18 DIAGNOSIS — R1013 Epigastric pain: Secondary | ICD-10-CM | POA: Diagnosis not present

## 2018-09-18 DIAGNOSIS — G44209 Tension-type headache, unspecified, not intractable: Secondary | ICD-10-CM

## 2018-09-18 DIAGNOSIS — E119 Type 2 diabetes mellitus without complications: Secondary | ICD-10-CM | POA: Diagnosis not present

## 2018-09-18 DIAGNOSIS — D696 Thrombocytopenia, unspecified: Secondary | ICD-10-CM

## 2018-09-18 DIAGNOSIS — Z96659 Presence of unspecified artificial knee joint: Secondary | ICD-10-CM | POA: Diagnosis not present

## 2018-09-18 DIAGNOSIS — I10 Essential (primary) hypertension: Secondary | ICD-10-CM | POA: Insufficient documentation

## 2018-09-18 LAB — CBC
HCT: 33 % — ABNORMAL LOW (ref 36.0–46.0)
Hemoglobin: 10.8 g/dL — ABNORMAL LOW (ref 12.0–15.0)
MCH: 26 pg (ref 26.0–34.0)
MCHC: 32.7 g/dL (ref 30.0–36.0)
MCV: 79.5 fL — AB (ref 80.0–100.0)
PLATELETS: 67 10*3/uL — AB (ref 150–400)
RBC: 4.15 MIL/uL (ref 3.87–5.11)
RDW: 15.5 % (ref 11.5–15.5)
WBC: 2.3 10*3/uL — ABNORMAL LOW (ref 4.0–10.5)
nRBC: 0 % (ref 0.0–0.2)

## 2018-09-18 LAB — COMPREHENSIVE METABOLIC PANEL
ALBUMIN: 3.9 g/dL (ref 3.5–5.0)
ALK PHOS: 75 U/L (ref 38–126)
ALT: 23 U/L (ref 0–44)
AST: 39 U/L (ref 15–41)
Anion gap: 6 (ref 5–15)
BILIRUBIN TOTAL: 1.4 mg/dL — AB (ref 0.3–1.2)
BUN: 20 mg/dL (ref 8–23)
CALCIUM: 8.8 mg/dL — AB (ref 8.9–10.3)
CO2: 22 mmol/L (ref 22–32)
CREATININE: 0.82 mg/dL (ref 0.44–1.00)
Chloride: 111 mmol/L (ref 98–111)
GFR calc Af Amer: >60 mL/min (ref >60)
GFR calc non Af Amer: >60 mL/min (ref >60)
GLUCOSE: 190 mg/dL — AB (ref 70–99)
Potassium: 4.1 mmol/L (ref 3.5–5.1)
SODIUM: 139 mmol/L (ref 135–145)
Total Protein: 7.2 g/dL (ref 6.5–8.1)

## 2018-09-18 LAB — URINALYSIS, ROUTINE W REFLEX MICROSCOPIC
BILIRUBIN URINE: NEGATIVE
GLUCOSE, UA: NEGATIVE mg/dL
HGB URINE DIPSTICK: NEGATIVE
Ketones, ur: NEGATIVE mg/dL
Leukocytes,Ua: NEGATIVE
Nitrite: NEGATIVE
PROTEIN: NEGATIVE mg/dL
Specific Gravity, Urine: 1.028 (ref 1.005–1.030)
pH: 6 (ref 5.0–8.0)

## 2018-09-18 LAB — LIPASE, BLOOD: Lipase: 68 U/L — ABNORMAL HIGH (ref 11–51)

## 2018-09-18 MED ORDER — ONDANSETRON 4 MG PO TBDP: 4.0000 mg | ORAL_TABLET | Freq: Three times a day (TID) | ORAL | 0 refills | Status: DC | PRN

## 2018-09-18 MED ORDER — IOPAMIDOL (ISOVUE-300) INJECTION 61%
100.0000 mL | Freq: Once | INTRAVENOUS | Status: AC | PRN
  Administered 2018-09-18: 100 mL via INTRAVENOUS

## 2018-09-18 MED ORDER — DICYCLOMINE HCL 20 MG PO TABS: 20.0000 mg | ORAL_TABLET | Freq: Three times a day (TID) | ORAL | 0 refills | Status: DC | PRN

## 2018-09-18 MED ORDER — SODIUM CHLORIDE (PF) 0.9 % IJ SOLN
INTRAMUSCULAR | Status: AC
  Filled 2018-09-18: qty 50

## 2018-09-18 MED ORDER — ONDANSETRON HCL 4 MG/2ML IJ SOLN
4.0000 mg | Freq: Once | INTRAMUSCULAR | Status: AC
  Administered 2018-09-18: 4 mg via INTRAVENOUS
  Filled 2018-09-18: qty 2

## 2018-09-18 MED ORDER — IOPAMIDOL (ISOVUE-300) INJECTION 61%
INTRAVENOUS | Status: AC
  Filled 2018-09-18: qty 100

## 2018-09-18 MED ORDER — SODIUM CHLORIDE 0.9% FLUSH
3.0000 mL | Freq: Once | INTRAVENOUS | Status: AC
  Administered 2018-09-18: 3 mL via INTRAVENOUS

## 2018-09-18 NOTE — ED Notes (Signed)
Accidentally clicked IV order, did not place IV. RN notified

## 2018-09-18 NOTE — ED Triage Notes (Signed)
Patient c/o abdominal cramping, nausea, emesis past 2 days,but not today., and headache,

## 2018-09-18 NOTE — ED Notes (Signed)
Patient ambulated to restroom with no assist and no problems.

## 2018-09-18 NOTE — ED Provider Notes (Signed)
Emergency Department Provider Note   I have reviewed the triage vital signs and the nursing notes.   HISTORY  Chief Complaint Abdominal Pain   HPI [name] is a 63 y.o. female with PMH of DM, HTN, and IBS presents to the emergency department for evaluation of abdominal cramping, nausea, vomiting, headache.  Patient has had symptoms over the past 2 days.  Symptoms are intermittent.  Denies fevers or chills.  No flulike symptoms.  She denies prior history of similar.  She describes intermittent, moderately intense headaches which resolved with over-the-counter medication.  She is not currently having a headache.  Denies any vision disturbance.  No weakness or numbness.  She feels intermittent nausea with some vomiting.  Her pain is mostly epigastric and twisting in quality.   Past Medical History:  Diagnosis Date   Diabetes mellitus without complication (HCC)    Hypertension    Irritable bowel     There are no active problems to display for this patient.   Past Surgical History:  Procedure Laterality Date   SHOULDER ARTHROTOMY     TOTAL KNEE ARTHROPLASTY     Allergies Hydrocodone-acetaminophen; Codeine; and Lactose intolerance (gi)  Family History  Problem Relation Age of Onset   Osteoarthritis Mother    Cancer Father     Social History Social History   Tobacco Use   Smoking status: Never Smoker   Smokeless tobacco: Never Used  Substance Use Topics   Alcohol use: No   Drug use: No    Review of Systems  Constitutional: No fever/chills Eyes: No visual changes. ENT: No sore throat. Cardiovascular: Denies chest pain. Respiratory: Denies shortness of breath. Gastrointestinal: Positive epigastric abdominal pain. Positive nausea and vomiting.  No diarrhea.  No constipation. Genitourinary: Negative for dysuria. Musculoskeletal: Negative for back pain. Skin: Negative for rash. Neurological: Negative for focal weakness or numbness. Positive  HA.  10-point ROS otherwise negative.  ____________________________________________   PHYSICAL EXAM:  VITAL SIGNS: ED Triage Vitals  Enc Vitals Group     BP 09/18/18 1501 (!) 146/93     Pulse Rate 09/18/18 1501 66     Resp 09/18/18 1501 16     Temp 09/18/18 1501 98.6 F (37 C)     Temp Source 09/18/18 1501 Oral     SpO2 09/18/18 1501 98 %     Weight 09/18/18 1504 170 lb 6 oz (77.3 kg)     Height 09/18/18 1504 5\' 3"  (1.6 m)     Pain Score 09/18/18 1503 7   Constitutional: Alert and oriented. Well appearing and in no acute distress. Eyes: Conjunctivae are normal. Head: Atraumatic. Nose: No congestion/rhinnorhea. Mouth/Throat: Mucous membranes are moist.  Neck: No stridor.  Cardiovascular: Normal rate, regular rhythm. Good peripheral circulation. Grossly normal heart sounds.   Respiratory: Normal respiratory effort.  No retractions. Lungs CTAB. Gastrointestinal: Soft with moderate LUQ tenderness. No rebound or guarding. No distention.  Musculoskeletal: No lower extremity tenderness nor edema. No gross deformities of extremities. Neurologic:  Normal speech and language. No gross focal neurologic deficits are appreciated.  Skin:  Skin is warm, dry and intact. No rash noted.  ____________________________________________   LABS (all labs ordered are listed, but only abnormal results are displayed)  Labs Reviewed  LIPASE, BLOOD - Abnormal; Notable for the following components:      Result Value   Lipase 68 (*)    All other components within normal limits  COMPREHENSIVE METABOLIC PANEL - Abnormal; Notable for the following components:  Glucose, Bld 190 (*)    Calcium 8.8 (*)    Total Bilirubin 1.4 (*)    All other components within normal limits  CBC - Abnormal; Notable for the following components:   WBC 2.3 (*)    Hemoglobin 10.8 (*)    HCT 33.0 (*)    MCV 79.5 (*)    Platelets 67 (*)    All other components within normal limits  URINALYSIS, ROUTINE W REFLEX  MICROSCOPIC - Abnormal; Notable for the following components:   APPearance HAZY (*)    All other components within normal limits   ____________________________________________  RADIOLOGY  Ct Head Wo Contrast  Result Date: 09/18/2018 CLINICAL DATA:  Chronic headache EXAM: CT HEAD WITHOUT CONTRAST TECHNIQUE: Contiguous axial images were obtained from the base of the skull through the vertex without intravenous contrast. COMPARISON:  05/07/2017 FINDINGS: Brain: No evidence of acute infarction, hemorrhage, hydrocephalus, extra-axial collection or mass lesion/mass effect. Vascular: No hyperdense vessel or unexpected calcification. Skull: Cranium is intact. Sinuses/Orbits: No acute finding. Other: Noncontributory. IMPRESSION: No acute intracranial pathology. Electronically Signed   By: Jolaine Click M.D.   On: 09/18/2018 18:16   Ct Abdomen Pelvis W Contrast  Result Date: 09/18/2018 CLINICAL DATA:  Nausea, vomiting, abdominal pain EXAM: CT ABDOMEN AND PELVIS WITH CONTRAST TECHNIQUE: Multidetector CT imaging of the abdomen and pelvis was performed using the standard protocol following bolus administration of intravenous contrast. CONTRAST:  ISOVUE-300 IOPAMIDOL (ISOVUE-300) INJECTION 61% COMPARISON:  05/07/2017 FINDINGS: Lower chest: No acute abnormality. Hepatobiliary: Micronodular contour of the liver as can be seen with cirrhosis. No focal hepatic mass. No intrahepatic or extrahepatic biliary ductal dilatation. Cholelithiasis. Small volume perihepatic ascites. Pancreas: Unremarkable. No pancreatic ductal dilatation or surrounding inflammatory changes. Spleen: Splenomegaly Adrenals/Urinary Tract: Adrenal glands are unremarkable. Kidneys are normal, without renal calculi, focal lesion, or hydronephrosis. Bladder is unremarkable. Stomach/Bowel: Stomach is within normal limits. Appendix appears normal. No evidence of bowel wall thickening, distention, or inflammatory changes. Vascular/Lymphatic: No  significant vascular findings are present. No enlarged abdominal or pelvic lymph nodes. Reproductive: Status post hysterectomy. No adnexal masses. Other: Small volume ascites.  No abdominal hernia. Musculoskeletal: No acute osseous abnormality. No aggressive osseous lesion. IMPRESSION: 1. Small volume ascites. 2. Micronodular contour of the liver with perihepatic ascites concerning for cirrhosis. No focal hepatic mass. 3. Cholelithiasis. 4. Splenomegaly. Electronically Signed   By: Elige Ko   On: 09/18/2018 19:05    ____________________________________________   PROCEDURES  Procedure(s) performed:   Procedures  None  ____________________________________________   INITIAL IMPRESSION / ASSESSMENT AND PLAN / ED COURSE  Pertinent labs & imaging results that were available during my care of the patient were reviewed by me and considered in my medical decision making (see chart for details).  Patient presents to the emergency department for evaluation of abdominal pain with nausea vomiting, associated headache.  Labs from triage reviewed which showed mild elevated lipase and pancytopenia.  Patient does note some intermittent bruising.  Platelets are 64.  I discussed that her WBCs, platelets, RBCs are low and she plans to discuss this with her PCP at the [place].  She will call to discuss this abnormality further and I have provided information regarding this in her discharge paperwork.  Given her focal tenderness on exam, age, I do plan for CT imaging of the abdomen and pelvis.  Plan for also CT imaging of the head given her intermittent, headache symptoms which are atypical for her.  No current headache.  No neuro  symptoms at this time. Patient has no respiratory or flu-like symptoms to indicate COVID or other respiratory virus testing in the ED. Discussed this with her and she is in agreement.   07:00 PM  CT imaging of the head and abdomen reviewed.  Patient with some evidence of liver cirrhosis  which was a known diagnosis.  No other acute findings in either image series.  Labs show thrombocytopenia but this is near baseline.  She will discuss with her PCP.  She reports that she believes she has an upper endoscopy scheduled for sometime in the next few months.  She will follow-up with her PCP and GI as needed.  Very low suspicion for atypical ACS or other cardiovascular etiology.  Very low suspicion for PE.  Plan for symptom management at home and return precautions discussed in the emergency department. ____________________________________________  FINAL CLINICAL IMPRESSION(S) / ED DIAGNOSES  Final diagnoses:  Epigastric pain  Acute non intractable tension-type headache  Thrombocytopenia (HCC)     MEDICATIONS GIVEN DURING THIS VISIT:  Medications  iopamidol (ISOVUE-300) 61 % injection (has no administration in time range)  sodium chloride (PF) 0.9 % injection (has no administration in time range)  sodium chloride flush (NS) 0.9 % injection 3 mL (3 mLs Intravenous Given 09/18/18 1658)  ondansetron (ZOFRAN) injection 4 mg (4 mg Intravenous Given 09/18/18 1637)  iopamidol (ISOVUE-300) 61 % injection 100 mL (100 mLs Intravenous Contrast Given 09/18/18 1737)     NEW OUTPATIENT MEDICATIONS STARTED DURING THIS VISIT:  New Prescriptions   DICYCLOMINE (BENTYL) 20 MG TABLET    Take 1 tablet (20 mg total) by mouth 3 (three) times daily as needed for spasms (abdominal cramping).   ONDANSETRON (ZOFRAN ODT) 4 MG DISINTEGRATING TABLET    Take 1 tablet (4 mg total) by mouth every 8 (eight) hours as needed for nausea or vomiting.    Note:  This document was prepared using Dragon voice recognition software and may include unintentional dictation errors.  Alona Bene, MD Emergency Medicine    Jillayne Witte, Arlyss Repress, MD 09/18/18 [id number]

## 2018-09-18 NOTE — ED Notes (Signed)
Patient transported to CT 

## 2018-09-18 NOTE — ED Notes (Signed)
Pt and visitor verbalized discharge instructions and follow up care. Alert and ambulatory. Leaving with visitor. IV removed

## 2018-09-18 NOTE — Discharge Instructions (Signed)

## 2019-02-16 ENCOUNTER — Emergency Department: Payer: Medicare HMO

## 2019-02-16 ENCOUNTER — Other Ambulatory Visit: Payer: Self-pay

## 2019-02-16 ENCOUNTER — Inpatient Hospital Stay
Admission: EM | Admit: 2019-02-16 | Discharge: 2019-02-21 | DRG: 432 | Disposition: A | Discharge status: Home / Self Care | Payer: Medicare HMO | Attending: Internal Medicine | Admitting: Internal Medicine

## 2019-02-16 DIAGNOSIS — Z20828 Contact with and (suspected) exposure to other viral communicable diseases: Secondary | ICD-10-CM | POA: Diagnosis present

## 2019-02-16 DIAGNOSIS — D62 Acute posthemorrhagic anemia: Secondary | ICD-10-CM | POA: Diagnosis present

## 2019-02-16 DIAGNOSIS — E876 Hypokalemia: Secondary | ICD-10-CM | POA: Diagnosis present

## 2019-02-16 DIAGNOSIS — K3189 Other diseases of stomach and duodenum: Secondary | ICD-10-CM | POA: Diagnosis present

## 2019-02-16 DIAGNOSIS — Z885 Allergy status to narcotic agent status: Secondary | ICD-10-CM

## 2019-02-16 DIAGNOSIS — I10 Essential (primary) hypertension: Secondary | ICD-10-CM | POA: Diagnosis present

## 2019-02-16 DIAGNOSIS — E119 Type 2 diabetes mellitus without complications: Secondary | ICD-10-CM | POA: Diagnosis present

## 2019-02-16 DIAGNOSIS — Z79899 Other long term (current) drug therapy: Secondary | ICD-10-CM

## 2019-02-16 DIAGNOSIS — I340 Nonrheumatic mitral (valve) insufficiency: Secondary | ICD-10-CM | POA: Diagnosis not present

## 2019-02-16 DIAGNOSIS — R188 Other ascites: Secondary | ICD-10-CM | POA: Diagnosis present

## 2019-02-16 DIAGNOSIS — K922 Gastrointestinal hemorrhage, unspecified: Secondary | ICD-10-CM | POA: Diagnosis present

## 2019-02-16 DIAGNOSIS — Z96659 Presence of unspecified artificial knee joint: Secondary | ICD-10-CM | POA: Diagnosis present

## 2019-02-16 DIAGNOSIS — D509 Iron deficiency anemia, unspecified: Secondary | ICD-10-CM | POA: Diagnosis not present

## 2019-02-16 DIAGNOSIS — I8511 Secondary esophageal varices with bleeding: Secondary | ICD-10-CM | POA: Diagnosis present

## 2019-02-16 DIAGNOSIS — R51 Headache: Secondary | ICD-10-CM | POA: Diagnosis present

## 2019-02-16 DIAGNOSIS — K580 Irritable bowel syndrome with diarrhea: Secondary | ICD-10-CM | POA: Diagnosis present

## 2019-02-16 DIAGNOSIS — K08109 Complete loss of teeth, unspecified cause, unspecified class: Secondary | ICD-10-CM | POA: Diagnosis present

## 2019-02-16 DIAGNOSIS — K7460 Unspecified cirrhosis of liver: Principal | ICD-10-CM | POA: Diagnosis present

## 2019-02-16 DIAGNOSIS — E739 Lactose intolerance, unspecified: Secondary | ICD-10-CM | POA: Diagnosis present

## 2019-02-16 DIAGNOSIS — R161 Splenomegaly, not elsewhere classified: Secondary | ICD-10-CM | POA: Diagnosis present

## 2019-02-16 DIAGNOSIS — I361 Nonrheumatic tricuspid (valve) insufficiency: Secondary | ICD-10-CM | POA: Diagnosis not present

## 2019-02-16 DIAGNOSIS — K7290 Hepatic failure, unspecified without coma: Secondary | ICD-10-CM | POA: Diagnosis not present

## 2019-02-16 DIAGNOSIS — K766 Portal hypertension: Secondary | ICD-10-CM | POA: Diagnosis present

## 2019-02-16 DIAGNOSIS — I8501 Esophageal varices with bleeding: Secondary | ICD-10-CM | POA: Diagnosis not present

## 2019-02-16 DIAGNOSIS — K921 Melena: Secondary | ICD-10-CM | POA: Diagnosis not present

## 2019-02-16 DIAGNOSIS — Z972 Presence of dental prosthetic device (complete) (partial): Secondary | ICD-10-CM

## 2019-02-16 DIAGNOSIS — K7469 Other cirrhosis of liver: Secondary | ICD-10-CM | POA: Diagnosis not present

## 2019-02-16 DIAGNOSIS — D696 Thrombocytopenia, unspecified: Secondary | ICD-10-CM | POA: Diagnosis not present

## 2019-02-16 LAB — CBC
HCT: 29.2 % — ABNORMAL LOW (ref 36.0–46.0)
Hemoglobin: 9.6 g/dL — ABNORMAL LOW (ref 12.0–15.0)
MCH: 24.1 pg — ABNORMAL LOW (ref 26.0–34.0)
MCHC: 32.9 g/dL (ref 30.0–36.0)
MCV: 73.2 fL — ABNORMAL LOW (ref 80.0–100.0)
Platelets: 92 10*3/uL — ABNORMAL LOW (ref 150–400)
RBC: 3.99 MIL/uL (ref 3.87–5.11)
RDW: 16.7 % — ABNORMAL HIGH (ref 11.5–15.5)
WBC: 5.5 10*3/uL (ref 4.0–10.5)
nRBC: 0 % (ref 0.0–0.2)

## 2019-02-16 LAB — SARS CORONAVIRUS 2 BY RT PCR (HOSPITAL ORDER, PERFORMED IN ~~LOC~~ HOSPITAL LAB): SARS Coronavirus 2: NEGATIVE

## 2019-02-16 LAB — COMPREHENSIVE METABOLIC PANEL
ALT: 23 U/L (ref 0–44)
AST: 37 U/L (ref 15–41)
Albumin: 3.5 g/dL (ref 3.5–5.0)
Alkaline Phosphatase: 70 U/L (ref 38–126)
Anion gap: 8 (ref 5–15)
BUN: 21 mg/dL (ref 8–23)
CO2: 22 mmol/L (ref 22–32)
Calcium: 8.5 mg/dL — ABNORMAL LOW (ref 8.9–10.3)
Chloride: 110 mmol/L (ref 98–111)
Creatinine, Ser: 0.95 mg/dL (ref 0.44–1.00)
GFR calc Af Amer: >60 mL/min (ref >60)
GFR calc non Af Amer: >60 mL/min (ref >60)
Glucose, Bld: 159 mg/dL — ABNORMAL HIGH (ref 70–99)
Potassium: 3.2 mmol/L — ABNORMAL LOW (ref 3.5–5.1)
Sodium: 140 mmol/L (ref 135–145)
Total Bilirubin: 1.5 mg/dL — ABNORMAL HIGH (ref 0.3–1.2)
Total Protein: 6.7 g/dL (ref 6.5–8.1)

## 2019-02-16 LAB — ABO/RH: ABO/RH(D): A POS

## 2019-02-16 LAB — PROTIME-INR
INR: 1.2 (ref 0.8–1.2)
Prothrombin Time: 15.2 seconds (ref 11.4–15.2)

## 2019-02-16 LAB — TROPONIN I (HIGH SENSITIVITY): Troponin I (High Sensitivity): 6 ng/L (ref <18)

## 2019-02-16 MED ORDER — ONDANSETRON HCL 4 MG/2ML IJ SOLN
INTRAMUSCULAR | Status: AC
  Administered 2019-02-16: 4 mg via INTRAVENOUS
  Filled 2019-02-16: qty 2

## 2019-02-16 MED ORDER — SODIUM CHLORIDE 0.9 % IV SOLN
10.0000 mL/h | Freq: Once | INTRAVENOUS | Status: AC
  Administered 2019-02-19: 09:00:00 10 mL/h via INTRAVENOUS

## 2019-02-16 MED ORDER — ONDANSETRON HCL 4 MG/2ML IJ SOLN
4.0000 mg | Freq: Once | INTRAMUSCULAR | Status: AC
  Administered 2019-02-16: 21:00:00 4 mg via INTRAVENOUS

## 2019-02-16 MED ORDER — PANTOPRAZOLE SODIUM 40 MG IV SOLR
40.0000 mg | Freq: Two times a day (BID) | INTRAVENOUS | Status: DC
  Administered 2019-02-20 – 2019-02-21 (×3): 40 mg via INTRAVENOUS
  Filled 2019-02-16 (×3): qty 40

## 2019-02-16 MED ORDER — SODIUM CHLORIDE 0.9 % IV SOLN
2.0000 g | INTRAVENOUS | Status: AC
  Administered 2019-02-16 – 2019-02-20 (×5): 2 g via INTRAVENOUS
  Filled 2019-02-16 (×2): qty 2
  Filled 2019-02-16: qty 20
  Filled 2019-02-16 (×2): qty 2

## 2019-02-16 MED ORDER — SODIUM CHLORIDE 0.9 % IV SOLN
8.0000 mg/h | INTRAVENOUS | Status: AC
  Administered 2019-02-16 – 2019-02-19 (×7): 8 mg/h via INTRAVENOUS
  Filled 2019-02-16 (×7): qty 80

## 2019-02-16 MED ORDER — SODIUM CHLORIDE 0.9 % IV SOLN
50.0000 ug/h | INTRAVENOUS | Status: DC
  Administered 2019-02-16 – 2019-02-21 (×10): 50 ug/h via INTRAVENOUS
  Filled 2019-02-16 (×22): qty 1

## 2019-02-16 MED ORDER — SODIUM CHLORIDE 0.9 % IV SOLN
80.0000 mg | Freq: Once | INTRAVENOUS | Status: AC
  Administered 2019-02-16: 22:00:00 80 mg via INTRAVENOUS
  Filled 2019-02-16: qty 80

## 2019-02-16 MED ORDER — POTASSIUM CHLORIDE 10 MEQ/100ML IV SOLN
10.0000 meq | INTRAVENOUS | Status: AC
  Administered 2019-02-17 (×2): 10 meq via INTRAVENOUS
  Filled 2019-02-16 (×2): qty 100

## 2019-02-16 MED ORDER — OCTREOTIDE LOAD VIA INFUSION
50.0000 ug | Freq: Once | INTRAVENOUS | Status: AC
  Administered 2019-02-16: 50 ug via INTRAVENOUS
  Filled 2019-02-16: qty 25

## 2019-02-16 NOTE — H&P (Addendum)
[facility] - [place] [facility]   PATIENT NAME: [name]    MR#:  [id number]  DATE OF BIRTH:  08/21/1955  DATE OF ADMISSION:  02/16/2019  PRIMARY CARE PHYSICIAN: Patient, No Pcp Per   REQUESTING/REFERRING PHYSICIAN: Dorothea GlassmanPaul Malinda, MD  CHIEF COMPLAINT:   Chief Complaint  Patient presents with  . Hematemesis    HISTORY OF PRESENT ILLNESS:  Kirsten RutsMabel Ware  is a 63 y.o. female with a known history of diabetes mellitus, hypertension, and irritable bowel syndrome.  She presented to the emergency room reporting an episode of nausea and vomiting with bright red bloody emesis followed by 4 loose diarrhea stools.  The first 2 stools were dark black and tarry.  The third stool began being watery with thick dark blood which was followed by the fourth stool which was the same.  Dr. Allegra LaiVanga was consulted by the ED physician and patient has been started on octreotide and Protonix infusions.  She denies a prior history of GI bleed.  However she had EGD and colon[facility] about 1 year [facility] and recalls results of small polyps and "thin esophagus "and was told she was a high risk for bleeding as related to her esophagus.  She denies fevers or chills.  She denies abdominal pain, chest pain, shortness of breath, or prior history of GI bleed.  Patient does take Motrin 200 to 400 mg a day usually for headache.  On arrival, her hemoglobin is 9.6 with hematocrit 29.2 and platelet count 92,000.  INR is 1.2.  Potassium is 3.2.  She has been admitted by the hospitalist  with transfer of care to the ICU. PAST MEDICAL HISTORY:   Past Medical History:  Diagnosis Date  . Diabetes mellitus without complication (HCC)   . Hypertension   . Irritable bowel     PAST SURGICAL HISTORY:   Past Surgical History:  Procedure Laterality Date  . SHOULDER ARTHROTOMY    . TOTAL KNEE ARTHROPLASTY      SOCIAL HISTORY:   Social History   Tobacco Use  . Smoking status: Never Smoker  .  Smokeless tobacco: Never Used  Substance Use Topics  . Alcohol use: No    FAMILY HISTORY:   Family History  Problem Relation Age of Onset  . Osteoarthritis Mother   . Cancer Father     DRUG ALLERGIES:   Allergies  Allergen Reactions  . Hydrocodone-Acetaminophen Nausea And Vomiting  . Codeine Nausea And Vomiting  . Lactose Intolerance (Gi)     REVIEW OF SYSTEMS:   Review of Systems  Constitutional: Negative for chills, fever and malaise/fatigue.  HENT: Negative for congestion, sinus pain and sore throat.   Eyes: Negative for blurred vision and double vision.  Respiratory: Negative for cough, hemoptysis, sputum production, shortness of breath and wheezing.   Cardiovascular: Negative for chest pain, palpitations and leg swelling.  Gastrointestinal: Positive for blood in stool, diarrhea and vomiting (Hematemesis). Negative for abdominal pain and nausea.  Genitourinary: Negative for dysuria, flank pain, frequency, hematuria and urgency.  Musculoskeletal: Negative for back pain, falls, joint pain, myalgias and neck pain.  Skin: Negative for itching and rash.  Neurological: Negative for dizziness, weakness and headaches.  Psychiatric/Behavioral: Negative for depression.    MEDICATIONS AT HOME:   Prior to Admission medications   Medication Sig Start Date End Date Taking? Authorizing Provider  ibuprofen (ADVIL,MOTRIN) 200 MG tablet Take 400 mg by mouth every 6 (six) hours as needed for headache.  Yes [provider]  CALCIUM PO Take 1 tablet by mouth 2 (two) times daily.    [provider]  dicyclomine (BENTYL) 20 MG tablet Take 1 tablet (20 mg total) by mouth 3 (three) times daily as needed for spasms (abdominal cramping). Patient not taking: Reported on 02/16/2019 09/18/18   Long, Wonda Olds, MD  esomeprazole (NEXIUM) 40 MG capsule Take 1 capsule (40 mg total) by mouth daily. Patient not taking: Reported on 02/16/2019 05/07/17   Drenda Freeze, MD   hydrochlorothiazide (HYDRODIURIL) 25 MG tablet Take 25 mg by mouth daily.    [provider]  loperamide (LOPERAMIDE A-D) 2 MG tablet Take 2 mg by mouth 2 (two) times daily as needed for diarrhea or loose stools.    [provider]  meclizine (ANTIVERT) 25 MG tablet Take 1 tablet (25 mg total) by mouth 3 (three) times daily as needed for dizziness. Patient not taking: Reported on 02/16/2019 05/07/17   Drenda Freeze, MD  METFORMIN HCL PO Take 2 tablets by mouth 2 (two) times daily.    [provider]  ondansetron (ZOFRAN ODT) 4 MG disintegrating tablet Take 1 tablet (4 mg total) by mouth every 8 (eight) hours as needed for nausea or vomiting. Patient not taking: Reported on 02/16/2019 09/18/18   Long, Wonda Olds, MD  POTASSIUM PO Take 1 tablet by mouth daily.    [provider]  PRESCRIPTION MEDICATION Take 1 tablet by mouth 2 (two) times daily.    [provider]      VITAL SIGNS:  Blood pressure 131/87, pulse 72, temperature 98.4 F (36.9 C), temperature source Oral, resp. rate 14, weight 77 kg, SpO2 100 %.  PHYSICAL EXAMINATION:  Physical Exam  GENERAL:  63 y.o.-year-old patient lying in the bed with no acute distress.  EYES: Pupils equal, round, reactive to light and accommodation. No scleral icterus. Extraocular muscles intact.  HEENT: Head atraumatic, normocephalic. Oropharynx and nasopharynx clear.  NECK:  Supple, no jugular venous distention. No thyroid enlargement, no tenderness.  LUNGS: Normal breath sounds bilaterally, no wheezing, rales,rhonchi or crepitation. No use of accessory muscles of respiration.  CARDIOVASCULAR: Regular rate and rhythm, S1, S2 normal. No murmurs, rubs, or gallops.  ABDOMEN: Soft, nondistended, and mild diffuse tenderness. Bowel sounds present. No organomegaly or mass.  EXTREMITIES: No pedal edema, cyanosis, or clubbing.  NEUROLOGIC: Cranial nerves II through XII are intact. Muscle strength 5/5 in all  extremities. Sensation intact. Gait not checked.  PSYCHIATRIC: The patient is alert and oriented x 3.  Normal affect and good eye contact. SKIN: No obvious rash, lesion, or ulcer.   LABORATORY PANEL:   CBC Recent Labs  Lab 02/16/19 2056  WBC 5.5  HGB 9.6*  HCT 29.2*  PLT 92*   ------------------------------------------------------------------------------------------------------------------  Chemistries  Recent Labs  Lab 02/16/19 2056  NA 140  K 3.2*  CL 110  CO2 22  GLUCOSE 159*  BUN 21  CREATININE 0.95  CALCIUM 8.5*  AST 37  ALT 23  ALKPHOS 70  BILITOT 1.5*   ------------------------------------------------------------------------------------------------------------------  Cardiac Enzymes No results for input(s): TROPONINI in the last 168 hours. ------------------------------------------------------------------------------------------------------------------  RADIOLOGY:  Dg Chest 2 View  Result Date: 02/16/2019 CLINICAL DATA:  Chest pain EXAM: CHEST - 2 VIEW COMPARISON:  None. FINDINGS: The heart size and mediastinal contours are within normal limits. Both lungs are clear. The visualized skeletal structures are unremarkable. IMPRESSION: No acute cardiopulmonary process. Electronically Signed   By: Ebony Cargo.D.  On: 02/16/2019 21:10      IMPRESSION AND PLAN:   1.  GI bleed - Dr. Allegra LaiVanga consulted - Octreotide and Protonix infusions initiated -We will continue every 6 hour hemoglobin and hematocrit and plan transfusion if hemoglobin is below 7.5. -Patient has normal saline infusing to peripheral IV at 75 cc/h -No blood thinners -N.p.o. - IV antiemetic for nausea/vomiting  2.  Hypokalemia - IV potassium replacement -Telemetry monitoring -Will repeat BMP in the a.m.  3.  Diabetes mellitus - Moderate sliding scale insulin  4.  Hypertension - Patient is n.p.o.  Will treat persistent hypertension with IV hydralazine -Telemetry monitoring  DVT  prophylaxis with SCDs.  PPI initiated  All the records are reviewed and case discussed with ED provider. The plan of care was discussed in details with the patient (and family). I answered all questions. The patient agreed to proceed with the above mentioned plan. Further management will depend upon hospital course.   CODE STATUS: Full code  TOTAL TIME TAKING CARE OF THIS PATIENT: 45 minutes.    Milas Kocherngela H [place] Talcott CRNP on 02/16/2019 at 11:06 PM  Pager - [phone]  After 6pm go to [email or web address] - Social research officer, governmentpassword [email or web address]  [facility] [place] [facility]  Office  [phone]  CC: Primary care physician; Patient, No Pcp Per   Note: This dictation was prepared with Dragon dictation along with smaller phrase technology. Any transcriptional errors that result from this process are unintentional.

## 2019-02-16 NOTE — ED Notes (Signed)
POC occult done with Positive results.

## 2019-02-16 NOTE — ED Provider Notes (Signed)
[facility] Emergency Department Provider Note   ____________________________________________   First MD Initiated Contact with Patient 02/16/19 2056     (approximate)  I have reviewed the triage vital signs and the nursing notes.   HISTORY  Chief Complaint Hematemesis    HPI [name] is a 63 y.o. female who reports [facility] and [facility] told that she had thin areas in the esophagus it could rupture if she vomited or yelled really loud.  This sounds like it could be varices.  Today she had one episode of vomiting blood and then had 4 diarrheal stools.  The first 2 were dark black this third 1 was watery blood in his last one here in the emergency room was thick dark blood.  Patient is lightheaded when she stands up.  She is not tachycardic or hypotensive at present.  Her blood count has dropped somewhat and her platelet count is somewhat low.  She really does not have any belly pain.  She is a little bloated she says.         Past Medical History:  Diagnosis Date  . Diabetes mellitus without complication (HCC)   . Hypertension   . Irritable bowel     There are no active problems to display for this patient.   Past Surgical History:  Procedure Laterality Date  . SHOULDER ARTHROTOMY    . TOTAL KNEE ARTHROPLASTY      Prior to Admission medications   Medication Sig Start Date End Date Taking? Authorizing Provider  CALCIUM PO Take 1 tablet by mouth 2 (two) times daily.    [provider]  dicyclomine (BENTYL) 20 MG tablet Take 1 tablet (20 mg total) by mouth 3 (three) times daily as needed for spasms (abdominal cramping). 09/18/18   Long, Arlyss RepressJoshua G, MD  esomeprazole (NEXIUM) 40 MG capsule Take 1 capsule (40 mg total) by mouth daily. 05/07/17   Charlynne PanderYao, David Hsienta, MD  hydrochlorothiazide (HYDRODIURIL) 25 MG tablet Take 25 mg by mouth daily.    [provider]  ibuprofen (ADVIL,MOTRIN) 200 MG  tablet Take 400 mg by mouth every 6 (six) hours as needed for headache.    [provider]  loperamide (LOPERAMIDE A-D) 2 MG tablet Take 2 mg by mouth 2 (two) times daily as needed for diarrhea or loose stools.    [provider]  meclizine (ANTIVERT) 25 MG tablet Take 1 tablet (25 mg total) by mouth 3 (three) times daily as needed for dizziness. 05/07/17   Charlynne PanderYao, David Hsienta, MD  METFORMIN HCL PO Take 2 tablets by mouth 2 (two) times daily.    [provider]  ondansetron (ZOFRAN ODT) 4 MG disintegrating tablet Take 1 tablet (4 mg total) by mouth every 8 (eight) hours as needed for nausea or vomiting. 09/18/18   Long, Arlyss RepressJoshua G, MD  POTASSIUM PO Take 1 tablet by mouth daily.    [provider]  PRESCRIPTION MEDICATION Take 1 tablet by mouth 2 (two) times daily.    [provider]    Allergies Hydrocodone-acetaminophen, Codeine, and Lactose intolerance (gi)  Family History  Problem Relation Age of Onset  . Osteoarthritis Mother   . Cancer Father     Social History Social History   Tobacco Use  . Smoking status: Never Smoker  . Smokeless tobacco: Never Used  Substance Use Topics  . Alcohol use: No  . Drug use: No    Review  of Systems  Constitutional: No fever/chills Eyes: No visual changes. ENT: No sore throat. Cardiovascular: Denies chest pain. Respiratory: Denies shortness of breath. Gastrointestinal: No abdominal pain.  No nausea, no vomiting.   diarrhea.  No constipation. Genitourinary: Negative for dysuria. Musculoskeletal: Negative for back pain. Skin: Negative for rash. Neurological: Negative for headaches, focal weakness   ____________________________________________   PHYSICAL EXAM:  VITAL SIGNS: ED Triage Vitals  Enc Vitals Group     BP 02/16/19 2043 134/82     Pulse Rate 02/16/19 2041 74     Resp 02/16/19 2041 (!) 23     Temp 02/16/19 2041 98.4 F (36.9 C)     Temp Source 02/16/19 2041 Oral     SpO2 02/16/19  2041 100 %     Weight 02/16/19 2047 169 lb 12.1 oz (77 kg)     Height --      Head Circumference --      Peak Flow --      Pain Score 02/16/19 2046 8     Pain Loc --      Pain Edu? --      Excl. in [place]? --     Constitutional: Alert and oriented. Well appearing and in no acute distress. Eyes: Conjunctivae are normal.  Head: Atraumatic. Nose: No congestion/rhinnorhea. Mouth/Throat: Mucous membranes are moist.  Oropharynx non-erythematous. Neck: No stridor.   Cardiovascular: Normal rate, regular rhythm. Grossly normal heart sounds.  Good peripheral circulation. Respiratory: Normal respiratory effort.  No retractions. Lungs CTAB. Gastrointestinal: Soft and nontender. No distention. No abdominal bruits. No CVA tenderness. Musculoskeletal: No lower extremity tenderness nor edema.   Neurologic:  Normal speech and language. No gross focal neurologic deficits are appreciated. No gait instability. Skin:  Skin is warm, dry and intact.    ____________________________________________   LABS (all labs ordered are listed, but only abnormal results are displayed)  Labs Reviewed  COMPREHENSIVE METABOLIC PANEL - Abnormal; Notable for the following components:      Result Value   Potassium 3.2 (*)    Glucose, Bld 159 (*)    Calcium 8.5 (*)    Total Bilirubin 1.5 (*)    All other components within normal limits  CBC - Abnormal; Notable for the following components:   Hemoglobin 9.6 (*)    HCT 29.2 (*)    MCV 73.2 (*)    MCH 24.1 (*)    RDW 16.7 (*)    Platelets 92 (*)    All other components within normal limits  PROTIME-INR  POC OCCULT BLOOD, ED  TYPE AND SCREEN  TROPONIN I (HIGH SENSITIVITY)   ____________________________________________  EKG  __________________________________________  RADIOLOGY  ED MD interpretation: Chest x-ray read by radiology reviewed by me is negative  Official radiology report(s): Dg Chest 2 View  Result Date: 02/16/2019 CLINICAL DATA:  Chest pain  EXAM: CHEST - 2 VIEW COMPARISON:  None. FINDINGS: The heart size and mediastinal contours are within normal limits. Both lungs are clear. The visualized skeletal structures are unremarkable. IMPRESSION: No acute cardiopulmonary process. Electronically Signed   By: Prudencio Pair M.D.   On: 02/16/2019 21:10    ____________________________________________   PROCEDURES  Procedure(s) performed (including Critical Care):  Procedures   ____________________________________________   INITIAL IMPRESSION / ASSESSMENT AND PLAN / ED COURSE  Shalisha PIPPA HANIF was evaluated in Emergency Department on 02/16/2019 for the symptoms described in the history of present illness. She was evaluated in the context of the global COVID-19 pandemic, which necessitated consideration that  the patient might be at risk for infection with the SARS-CoV-2 virus that causes COVID-19. Institutional protocols and algorithms that pertain to the evaluation of patients at risk for COVID-19 are in a state of rapid change based on information released by regulatory bodies including the CDC and federal and state organizations. These policies and algorithms were followed during the patient's care in the ED.     Patient having 4 diarrheal stools with blood 1 after the other and vomiting with possible varices.  I do not think patient is stable at this point [facility]  I discussed her with Dr. Allegra LaiVanga on call for GI.  Dr. Allegra LaiVanga thinks we should admit her to the ICU.  I think this is a good idea.  At present I do not know that you need emergency release blood however we are getting a type and screen done and will have to type and cross her for transfusion.  Her platelet count is low.  Her H&H is dropped from previously.         ____________________________________________   FINAL CLINICAL IMPRESSION(S) / ED DIAGNOSES  Final diagnoses:  None     ED Discharge Orders    None       Note:  This document was  prepared using Dragon voice recognition software and may include unintentional dictation errors.    Arnaldo NatalMalinda, Timika Muench F, MD 02/16/19 2146

## 2019-02-16 NOTE — ED Notes (Signed)
Patient able to go to bathroom and not have bloody stools.

## 2019-02-16 NOTE — ED Notes (Signed)
Patient transported to X-ray 

## 2019-02-16 NOTE — ED Triage Notes (Signed)
Patient c/o hematemesis, medial chest pain, black stools, diarrhea. Patient with labored respirations. Patient reports hx of iron infusions at [facility] and "thin tissue in esophagus". Patient asked if she means esophageal varices and patient responded yes.

## 2019-02-16 NOTE — OR Nursing (Signed)
Dr. Marius Ditch spoke with Dr. Marcello Moores would approved EGD for 8am. Dr. Marcello Moores to notify CRNA.

## 2019-02-17 ENCOUNTER — Inpatient Hospital Stay: Payer: Medicare HMO

## 2019-02-17 ENCOUNTER — Encounter: Payer: Self-pay | Admitting: Anesthesiology

## 2019-02-17 ENCOUNTER — Inpatient Hospital Stay: Payer: Medicare HMO | Admitting: Anesthesiology

## 2019-02-17 ENCOUNTER — Encounter
Admission: EM | Disposition: A | Discharge status: Home / Self Care | Payer: Self-pay | Source: Home / Self Care | Attending: Internal Medicine

## 2019-02-17 DIAGNOSIS — R188 Other ascites: Secondary | ICD-10-CM

## 2019-02-17 DIAGNOSIS — K7460 Unspecified cirrhosis of liver: Principal | ICD-10-CM

## 2019-02-17 DIAGNOSIS — I8501 Esophageal varices with bleeding: Secondary | ICD-10-CM

## 2019-02-17 DIAGNOSIS — K7290 Hepatic failure, unspecified without coma: Secondary | ICD-10-CM

## 2019-02-17 DIAGNOSIS — D62 Acute posthemorrhagic anemia: Secondary | ICD-10-CM

## 2019-02-17 DIAGNOSIS — K922 Gastrointestinal hemorrhage, unspecified: Secondary | ICD-10-CM

## 2019-02-17 DIAGNOSIS — K7469 Other cirrhosis of liver: Secondary | ICD-10-CM

## 2019-02-17 HISTORY — PX: ESOPHAGOGASTRODUODENOSCOPY: SHX5428

## 2019-02-17 LAB — IRON AND TIBC
Iron: 30 ug/dL (ref 28–170)
Saturation Ratios: 9 % — ABNORMAL LOW (ref 10.4–31.8)
TIBC: 321 ug/dL (ref 250–450)
UIBC: 291 ug/dL

## 2019-02-17 LAB — TYPE AND SCREEN
ABO/RH(D): A POS
Antibody Screen: NEGATIVE
Unit division: 0
Unit division: 0

## 2019-02-17 LAB — CBC
HCT: 24.9 % — ABNORMAL LOW (ref 36.0–46.0)
HCT: 23.4 % — ABNORMAL LOW (ref 36.0–46.0)
Hemoglobin: 8.2 g/dL — ABNORMAL LOW (ref 12.0–15.0)
Hemoglobin: 7.6 g/dL — ABNORMAL LOW (ref 12.0–15.0)
MCH: 24 pg — ABNORMAL LOW (ref 26.0–34.0)
MCH: 23.9 pg — ABNORMAL LOW (ref 26.0–34.0)
MCHC: 32.9 g/dL (ref 30.0–36.0)
MCHC: 32.5 g/dL (ref 30.0–36.0)
MCV: 73 fL — ABNORMAL LOW (ref 80.0–100.0)
MCV: 73.6 fL — ABNORMAL LOW (ref 80.0–100.0)
Platelets: 66 10*3/uL — ABNORMAL LOW (ref 150–400)
Platelets: 61 10*3/uL — ABNORMAL LOW (ref 150–400)
RBC: 3.41 MIL/uL — ABNORMAL LOW (ref 3.87–5.11)
RBC: 3.18 MIL/uL — ABNORMAL LOW (ref 3.87–5.11)
RDW: 16.7 % — ABNORMAL HIGH (ref 11.5–15.5)
RDW: 16.7 % — ABNORMAL HIGH (ref 11.5–15.5)
WBC: 2.9 10*3/uL — ABNORMAL LOW (ref 4.0–10.5)
WBC: 2.4 10*3/uL — ABNORMAL LOW (ref 4.0–10.5)
nRBC: 0 % (ref 0.0–0.2)
nRBC: 0 % (ref 0.0–0.2)

## 2019-02-17 LAB — GLUCOSE, CAPILLARY
Glucose-Capillary: 132 mg/dL — ABNORMAL HIGH (ref 70–99)
Glucose-Capillary: 138 mg/dL — ABNORMAL HIGH (ref 70–99)
Glucose-Capillary: 122 mg/dL — ABNORMAL HIGH (ref 70–99)
Glucose-Capillary: 125 mg/dL — ABNORMAL HIGH (ref 70–99)
Glucose-Capillary: 163 mg/dL — ABNORMAL HIGH (ref 70–99)

## 2019-02-17 LAB — BASIC METABOLIC PANEL
Anion gap: 5 (ref 5–15)
BUN: 19 mg/dL (ref 8–23)
CO2: 21 mmol/L — ABNORMAL LOW (ref 22–32)
Calcium: 8.1 mg/dL — ABNORMAL LOW (ref 8.9–10.3)
Chloride: 116 mmol/L — ABNORMAL HIGH (ref 98–111)
Creatinine, Ser: 0.72 mg/dL (ref 0.44–1.00)
GFR calc Af Amer: >60 mL/min (ref >60)
GFR calc non Af Amer: >60 mL/min (ref >60)
Glucose, Bld: 154 mg/dL — ABNORMAL HIGH (ref 70–99)
Potassium: 4 mmol/L (ref 3.5–5.1)
Sodium: 142 mmol/L (ref 135–145)

## 2019-02-17 LAB — HEMOGLOBIN AND HEMATOCRIT, BLOOD
HCT: 27.9 % — ABNORMAL LOW (ref 36.0–46.0)
Hemoglobin: 8.8 g/dL — ABNORMAL LOW (ref 12.0–15.0)

## 2019-02-17 LAB — HEMOGLOBIN A1C
Hgb A1c MFr Bld: 6.6 % — ABNORMAL HIGH (ref 4.8–5.6)
Mean Plasma Glucose: 142.72 mg/dL

## 2019-02-17 LAB — PHOSPHORUS: Phosphorus: 3.5 mg/dL (ref 2.5–4.6)

## 2019-02-17 LAB — FERRITIN: Ferritin: 14 ng/mL (ref 11–307)

## 2019-02-17 LAB — PREPARE RBC (CROSSMATCH)

## 2019-02-17 LAB — TSH: TSH: 0.432 u[IU]/mL (ref 0.350–4.500)

## 2019-02-17 LAB — BPAM RBC
Blood Product Expiration Date: 202009152359
Blood Product Expiration Date: 202009152359
ISSUE DATE / TIME: 202008151427
Unit Type and Rh: 6200
Unit Type and Rh: 6200

## 2019-02-17 LAB — MRSA PCR SCREENING: MRSA by PCR: NEGATIVE

## 2019-02-17 LAB — PROTIME-INR
INR: 1.3 — ABNORMAL HIGH (ref 0.8–1.2)
Prothrombin Time: 16 seconds — ABNORMAL HIGH (ref 11.4–15.2)

## 2019-02-17 LAB — MAGNESIUM: Magnesium: 1.8 mg/dL (ref 1.7–2.4)

## 2019-02-17 LAB — FOLATE: Folate: 12.1 ng/mL (ref >5.9)

## 2019-02-17 LAB — VITAMIN B12: Vitamin B-12: 794 pg/mL (ref 180–914)

## 2019-02-17 LAB — TROPONIN I (HIGH SENSITIVITY): Troponin I (High Sensitivity): 8 ng/L (ref <18)

## 2019-02-17 SURGERY — EGD (ESOPHAGOGASTRODUODENOSCOPY)
Anesthesia: General

## 2019-02-17 MED ORDER — LACTATED RINGERS IV SOLN
INTRAVENOUS | Status: DC | PRN
  Administered 2019-02-17: 09:00:00 via INTRAVENOUS

## 2019-02-17 MED ORDER — ACETAMINOPHEN 325 MG PO TABS
650.0000 mg | ORAL_TABLET | Freq: Four times a day (QID) | ORAL | Status: DC | PRN
  Administered 2019-02-17 – 2019-02-20 (×4): 650 mg via ORAL
  Filled 2019-02-17 (×4): qty 2

## 2019-02-17 MED ORDER — INSULIN ASPART 100 UNIT/ML ~~LOC~~ SOLN
0.0000 [IU] | Freq: Three times a day (TID) | SUBCUTANEOUS | Status: DC
  Administered 2019-02-17 – 2019-02-18 (×4): 2 [IU] via SUBCUTANEOUS
  Administered 2019-02-18 (×2): 3 [IU] via SUBCUTANEOUS
  Administered 2019-02-19 (×2): 2 [IU] via SUBCUTANEOUS
  Administered 2019-02-20: 3 [IU] via SUBCUTANEOUS
  Administered 2019-02-20 – 2019-02-21 (×3): 2 [IU] via SUBCUTANEOUS
  Administered 2019-02-21: 3 [IU] via SUBCUTANEOUS
  Filled 2019-02-17 (×13): qty 1

## 2019-02-17 MED ORDER — DICYCLOMINE HCL 20 MG PO TABS
20.0000 mg | ORAL_TABLET | Freq: Four times a day (QID) | ORAL | Status: DC | PRN
  Administered 2019-02-17 – 2019-02-18 (×2): 20 mg via ORAL
  Filled 2019-02-17 (×3): qty 1

## 2019-02-17 MED ORDER — PROPOFOL 500 MG/50ML IV EMUL
INTRAVENOUS | Status: DC | PRN
  Administered 2019-02-17: 150 ug/kg/min via INTRAVENOUS

## 2019-02-17 MED ORDER — SODIUM CHLORIDE 0.9 % IV SOLN
510.0000 mg | Freq: Once | INTRAVENOUS | Status: AC
  Administered 2019-02-17: 510 mg via INTRAVENOUS
  Filled 2019-02-17: qty 17

## 2019-02-17 MED ORDER — SUCCINYLCHOLINE CHLORIDE 20 MG/ML IJ SOLN
INTRAMUSCULAR | Status: AC
  Filled 2019-02-17: qty 1

## 2019-02-17 MED ORDER — SODIUM CHLORIDE 0.9% IV SOLUTION: Freq: Once | INTRAVENOUS | Status: DC

## 2019-02-17 MED ORDER — ONDANSETRON HCL 4 MG PO TABS: 4.0000 mg | ORAL_TABLET | Freq: Four times a day (QID) | ORAL | Status: DC | PRN

## 2019-02-17 MED ORDER — PROPOFOL 10 MG/ML IV BOLUS
INTRAVENOUS | Status: AC
  Filled 2019-02-17: qty 40

## 2019-02-17 MED ORDER — INSULIN ASPART 100 UNIT/ML ~~LOC~~ SOLN: 0.0000 [IU] | Freq: Every day | SUBCUTANEOUS | Status: DC

## 2019-02-17 MED ORDER — SODIUM CHLORIDE 0.9 % IV SOLN
INTRAVENOUS | Status: DC
  Administered 2019-02-17: 03:00:00 via INTRAVENOUS

## 2019-02-17 MED ORDER — OXYCODONE HCL 5 MG PO TABS: 5.0000 mg | ORAL_TABLET | Freq: Four times a day (QID) | ORAL | Status: DC | PRN

## 2019-02-17 MED ORDER — ONDANSETRON HCL 4 MG/2ML IJ SOLN
4.0000 mg | Freq: Four times a day (QID) | INTRAMUSCULAR | Status: DC | PRN
  Administered 2019-02-18 (×2): 4 mg via INTRAVENOUS
  Filled 2019-02-17 (×2): qty 2

## 2019-02-17 MED ORDER — PROPOFOL 10 MG/ML IV BOLUS
INTRAVENOUS | Status: DC | PRN
  Administered 2019-02-17: 100 mg via INTRAVENOUS

## 2019-02-17 MED ORDER — TRAMADOL HCL 50 MG PO TABS
50.0000 mg | ORAL_TABLET | Freq: Four times a day (QID) | ORAL | Status: DC | PRN
  Administered 2019-02-17 – 2019-02-21 (×10): 50 mg via ORAL
  Filled 2019-02-17 (×10): qty 1

## 2019-02-17 MED ORDER — HYDRALAZINE HCL 20 MG/ML IJ SOLN
10.0000 mg | Freq: Four times a day (QID) | INTRAMUSCULAR | Status: DC | PRN
  Administered 2019-02-17 – 2019-02-18 (×2): 10 mg via INTRAVENOUS
  Filled 2019-02-17 (×3): qty 1

## 2019-02-17 NOTE — Transfer of Care (Signed)
Immediate Anesthesia Transfer of Care Note  Patient: [name]  Procedure(s) Performed: Procedure(s): ESOPHAGOGASTRODUODENOSCOPY (EGD) (N/A)  Patient Location: PACU and Endoscopy Unit  Anesthesia Type:General  Level of Consciousness: sedated  Airway & Oxygen Therapy: Patient Spontanous Breathing and Patient connected to nasal cannula oxygen  Post-op Assessment: Report given to RN and Post -op Vital signs reviewed and stable  Post vital signs: Reviewed and stable  Last Vitals:  Vitals:   02/17/19 0835 02/17/19 0906  BP: (!) 147/78 (!) 157/90  Pulse:  75  Resp: 14 14  Temp:    SpO2: 122% 482%    Complications: No apparent anesthesia complications

## 2019-02-17 NOTE — Progress Notes (Signed)
[facility] [place] NAME: [name]    MR#:  [id number]  DATE OF BIRTH:  08/24/1955  SUBJECTIVE:  CHIEF COMPLAINT:   Chief Complaint  Patient presents with  . Hematemesis   No new complaint this morning.  Patient just came back from EGD.  Still on octreotide and Protonix drip in the ICU.  REVIEW OF SYSTEMS:  Review of Systems  Constitutional: Negative for chills and fever.  HENT: Negative for hearing loss and tinnitus.   Eyes: Negative for blurred vision and double vision.  Respiratory: Negative for sputum production.   Cardiovascular: Negative for chest pain and palpitations.  Gastrointestinal: Negative for abdominal pain and constipation.       Admitted with vomiting of blood.  Genitourinary: Negative for dysuria and urgency.  Musculoskeletal: Negative for myalgias and neck pain.  Skin: Negative for itching and rash.  Neurological: Negative for dizziness and headaches.  Psychiatric/Behavioral: Negative for depression and hallucinations.    DRUG ALLERGIES:   Allergies  Allergen Reactions  . Hydrocodone-Acetaminophen Nausea And Vomiting  . Codeine Nausea And Vomiting  . Lactose Intolerance (Gi)    VITALS:  Blood pressure (!) 159/76, pulse 62, temperature 98.6 F (37 C), temperature source Axillary, resp. rate 17, height 5\' 2"  (1.575 m), weight 78.9 kg, SpO2 95 %. PHYSICAL EXAMINATION:  Physical Exam  GENERAL:  63 y.o.-year-old patient lying in the bed with no acute distress.  EYES: Pupils equal, round, reactive to light and accommodation. No scleral icterus. Extraocular muscles intact.  HEENT: Head atraumatic, normocephalic. Oropharynx and nasopharynx clear.  NECK:  Supple, no jugular venous distention. No thyroid enlargement, no tenderness.  LUNGS: Normal breath sounds bilaterally, no wheezing, rales,rhonchi or crepitation. No use of accessory muscles of respiration.  CARDIOVASCULAR: Regular rate and rhythm, S1, S2  normal. No murmurs, rubs, or gallops.  ABDOMEN: Soft, nondistended,  nontender, Bowel sounds present. No organomegaly or mass.  EXTREMITIES: No pedal edema, cyanosis, or clubbing.  NEUROLOGIC: Cranial nerves II through XII are intact. Muscle strength 5/5 in all extremities. Sensation intact. Gait not checked.  PSYCHIATRIC: The patient is alert and oriented x 3.  Normal affect and good eye contact. SKIN: No obvious rash, lesion, or ulcer.  LABORATORY PANEL:  Female CBC Recent Labs  Lab 02/17/19 0443 02/17/19 1131  WBC 2.4*  --   HGB 7.6* 8.8*  HCT 23.4* 27.9*  PLT 61*  --    ------------------------------------------------------------------------------------------------------------------ Chemistries  Recent Labs  Lab 02/16/19 2056 02/17/19 0443 02/17/19 1131  NA 140 142  --   K 3.2* 4.0  --   CL 110 116*  --   CO2 22 21*  --   GLUCOSE 159* 154*  --   BUN 21 19  --   CREATININE 0.95 0.72  --   CALCIUM 8.5* 8.1*  --   MG  --   --  1.8  AST 37  --   --   ALT 23  --   --   ALKPHOS 70  --   --   BILITOT 1.5*  --   --    RADIOLOGY:  Dg Chest 2 View  Result Date: 02/16/2019 CLINICAL DATA:  Chest pain EXAM: CHEST - 2 VIEW COMPARISON:  None. FINDINGS: The heart size and mediastinal contours are within normal limits. Both lungs are clear. The visualized skeletal structures are unremarkable. IMPRESSION: No acute cardiopulmonary process. Electronically Signed   By: Prudencio Pair M.D.   On: 02/16/2019  21:10   [place] Liver Doppler  Result Date: 02/17/2019 CLINICAL DATA:  63 year old with history of cirrhosis and esophageal varices. Recently presented with bloody emesis. EXAM: DUPLEX ULTRASOUND OF LIVER TECHNIQUE: Color and duplex Doppler ultrasound was performed to evaluate the hepatic in-flow and out-flow vessels. COMPARISON:  CT 09/18/2018 FINDINGS: Liver: Nodular hepatic contour is compatible with cirrhosis. Liver parenchyma is mildly heterogeneous. Gallbladder is mild-to-moderately  distended with multiple echogenic stones. Trace perihepatic ascites. No focal lesion, mass or intrahepatic biliary ductal dilatation. Main Portal Vein size: 1.3 cm Portal Vein Velocities Main Prox:  31 cm/sec Main Mid: 25 cm/sec Main Dist:  26 cm/sec Right: 11 cm/sec Left: 16 cm/sec Hepatic Vein Velocities Right:  30 cm/sec Middle:  27 cm/sec Left:  18 cm/sec IVC: Present and patent with normal respiratory phasicity. Hepatic Artery Velocity:  71 cm/sec Splenic Vein Velocity:  25 cm/sec Spleen: 18 cm x 9 cm x 11 cm with a total volume of 912 cm^3 (411 cm^3 is upper limit normal) Portal Vein Occlusion/Thrombus: No Splenic Vein Occlusion/Thrombus: No Ascites: Present Varices: None Normal hepatopetal flow in the portal venous system. Normal hepatofugal flow in the hepatic veins. Perisplenic ascites. IMPRESSION: 1. Cirrhosis with portal hypertension demonstrated by splenomegaly and ascites. 2. Portal venous system is patent with normal direction of flow. 3. Cholelithiasis. Electronically Signed   By: Richarda OverlieAdam  Henn M.D.   On: 02/17/2019 14:03   ASSESSMENT AND PLAN:   1.  GI bleed -  Patient presented with hematemesis.  Admitted to the ICU and started on octreotide and Protonix infusions.   Patient status post EGD done this morning which revealed portal hypertensive gastropathy.  Recently bleeding large (> 5 mm) esophageal varices. Incompletely eradicated. Banded. No specimens collected. Patient transferred back to the ICU with plans to initiate clear liquid diet today. IV fluid hydration.  PRN antiemetics Follow-up on liver ultrasound ordered.  2.  Hypokalemia Replaced  3.  Diabetes mellitus - Moderate sliding scale insulin  4.  Hypertension - Patient is n.p.o.  Will treat persistent hypertension with IV hydralazine -We will resume p.o. blood pressure meds once patient tolerating p.o. intake.  DVT prophylaxis with SCDs.   All the records are reviewed and case discussed with Care Management/Social  Worker. Management plans discussed with the patient, family and they are in agreement.  CODE STATUS: Full Code  TOTAL TIME TAKING CARE OF THIS PATIENT: 39 minutes.   More than 50% of the time was spent in counseling/coordination of care: YES  POSSIBLE D/C IN 2 DAYS, DEPENDING ON CLINICAL CONDITION.   Celsey Asselin M.D on 02/17/2019 at 2:58 PM  Between 7am to 6pm - Pager - [phone]  After 6pm go to [email or web address] - Social research officer, governmentpassword [email or web address]  [facility]  Office  [phone]  CC: Primary care physician; Patient, No Pcp Per  Note: This dictation was prepared with Dragon dictation along with smaller phrase technology. Any transcriptional errors that result from this process are unintentional.

## 2019-02-17 NOTE — Anesthesia Procedure Notes (Signed)
Date/Time: 02/17/2019 9:59 AM Performed by: Doreen Salvage, CRNA Pre-anesthesia Checklist: Patient identified, Emergency Drugs available, Suction available and Patient being monitored Patient Re-evaluated:Patient Re-evaluated prior to induction Oxygen Delivery Method: Supernova nasal CPAP Induction Type: IV induction Dental Injury: Teeth and Oropharynx as per pre-operative assessment  Comments: Nasal cannula with etCO2 monitoring

## 2019-02-17 NOTE — Op Note (Signed)
[facility] Patient Name: [name] Procedure Date: 02/17/2019 8:06 AM MRN: [id number] Account #: [id number] Date of Birth: [birth year] Admit Type: Inpatient Age: 63 Room: [facility] ENDO ROOM 4 Gender: Female Note Status: Finalized Procedure:            Upper GI endoscopy Indications:          Acute post hemorrhagic anemia, Hematemesis, Cirrhosis                        with UGI bleeding suspected esophageal varices Providers:            Lin Landsman MD, MD Referring MD:         Thayer Dallas N                       c Medicines:            Monitored Anesthesia Care Complications:        No immediate complications. Estimated blood loss: None. Procedure:            Pre-Anesthesia Assessment:                       - Prior to the procedure, a History and Physical was                        performed, and patient medications and allergies were                        reviewed. The patient is competent. The risks and                        benefits of the procedure and the sedation options and                        risks were discussed with the patient. All questions                        were answered and informed consent was obtained.                        Patient identification and proposed procedure were                        verified by the physician, the nurse, the                        anesthesiologist, the anesthetist and the technician in                        the pre-procedure area in the procedure room in the                        endoscopy suite. Mental Status Examination: alert and                        oriented. Airway Examination: normal oropharyngeal                        airway and neck mobility. Respiratory Examination:  clear to auscultation. CV Examination: normal.                        Prophylactic Antibiotics: The patient does not require                        prophylactic antibiotics. Prior  Anticoagulants: The                        patient has taken no previous anticoagulant or                        antiplatelet agents. ASA Grade Assessment: IV - A                        patient with severe systemic disease that is a constant                        threat to life. After reviewing the risks and benefits,                        the patient was deemed in satisfactory condition to                        undergo the procedure. The anesthesia plan was to use                        monitored anesthesia care (MAC). Immediately prior to                        administration of medications, the patient was                        re-assessed for adequacy to receive sedatives. The                        heart rate, respiratory rate, oxygen saturations, blood                        pressure, adequacy of pulmonary ventilation, and                        response to care were monitored throughout the                        procedure. The physical status of the patient was                        re-assessed after the procedure.                       After obtaining informed consent, the endoscope was                        passed under direct vision. Throughout the procedure,                        the patient's blood pressure, pulse, and oxygen  saturations were monitored continuously. The Endoscope                        was introduced through the mouth, and advanced to the                        second part of duodenum. The upper GI endoscopy was                        accomplished without difficulty. The patient tolerated                        the procedure well. Findings:      The duodenal bulb and second portion of the duodenum were normal.      Severe portal hypertensive gastropathy was found in the gastric fundus.      Four columns of non-bleeding large (> 5 mm) varices were found in the       middle third of the esophagus and in the lower third of the  esophagus,.       Stigmata of recent bleeding were evident and red wale signs were       present. The varices appeared larger than they were at prior exam. Four       bands were successfully placed with incomplete eradication of varices.       There was no bleeding during and at the end of the procedure. Impression:           - Normal duodenal bulb and second portion of the                        duodenum.                       - Portal hypertensive gastropathy.                       - Recently bleeding large (> 5 mm) esophageal varices.                        Incompletely eradicated. Banded.                       - No specimens collected. Recommendation:       - Return patient to ICU for ongoing care.                       - Clear liquid diet today.                       - Continue present medications.                       - Repeat upper endoscopy in 4 weeks for retreatment and                        for endoscopic band ligation.                       - Return to GI office in 2 weeks. Procedure Code(s):    --- Professional ---  43244, Esophagogastroduodenoscopy, flexible, transoral;                        with band ligation of esophageal/gastric varices Diagnosis Code(s):    --- Professional ---                       K74.60, Unspecified cirrhosis of liver                       I85.11, Secondary esophageal varices with bleeding                       K76.6, Portal hypertension                       K31.89, Other diseases of stomach and duodenum                       D62, Acute posthemorrhagic anemia                       K92.0, Hematemesis CPT copyright 2019 American Medical Association. All rights reserved. The codes documented in this report are preliminary and upon coder review may  be revised to meet current compliance requirements. Dr. Ulyess Mort Lin Landsman MD, MD 02/17/2019 9:09:28 AM This report has been signed electronically. Number of Addenda:  0 Note Initiated On: 02/17/2019 8:06 AM Estimated Blood Loss: Estimated blood loss: none.      [facility]

## 2019-02-17 NOTE — ED Notes (Signed)
ED TO INPATIENT HANDOFF REPORT  ED Nurse Name and Phone #: Cala BradfordKimberly [phone]  S Name/Age/Gender [name] 63 y.o. female Room/Bed: ED17A/ED17A  Code Status   Code Status: Not on file  Home/SNF/Other Home Patient oriented to: self, place, time and situation Is this baseline? Yes   Triage Complete: Triage complete  Chief Complaint vomitting  Triage Note Patient c/o hematemesis, medial chest pain, black stools, diarrhea. Patient with labored respirations. Patient [facility] and "thin tissue in esophagus". Patient asked if she means esophageal varices and patient responded yes.    Allergies Allergies  Allergen Reactions  . Hydrocodone-Acetaminophen Nausea And Vomiting  . Codeine Nausea And Vomiting  . Lactose Intolerance (Gi)     Level of Care/Admitting Diagnosis ED Disposition    ED Disposition Condition Comment   Admit  [facility] [[id number]]  Level of Care: Med-Surg [16]  Covid Evaluation: Asymptomatic Screening Protocol (No Symptoms)  Diagnosis: GI bleed [[id number]][[id number]]  Admitting Physician: Pearletha AlfredSEALS, ANGELA H [[id number]]  Attending Physician: Pearletha AlfredSEALS, ANGELA H [[id number]]  Estimated length of stay: past midnight tomorrow  Certification:: I certify this patient will need inpatient services for at least 2 midnights  PT Class (Do Not Modify): Inpatient [101]  PT Acc Code (Do Not Modify): Private [1]       B Medical/Surgery History Past Medical History:  Diagnosis Date  . Diabetes mellitus without complication (HCC)   . Hypertension   . Irritable bowel    Past Surgical History:  Procedure Laterality Date  . SHOULDER ARTHROTOMY    . TOTAL KNEE ARTHROPLASTY       A IV Location/Drains/Wounds Patient Lines/Drains/Airways Status   Active Line/Drains/Airways    Name:   Placement date:   Placement time:   Site:   Days:   Peripheral IV 02/16/19 Right Antecubital   02/16/19    2056    Antecubital   1   Peripheral  IV 02/16/19 Left Antecubital   02/16/19    2148    Antecubital   1          Intake/Output Last 24 hours No intake or output data in the 24 hours ending 02/17/19 0001  Labs/Imaging Results for orders placed or performed during the hospital encounter of 02/16/19 (from the past 48 hour(s))  Comprehensive metabolic panel     Status: Abnormal   Collection Time: 02/16/19  8:56 PM  Result Value Ref Range   Sodium 140 135 - 145 mmol/L   Potassium 3.2 (L) 3.5 - 5.1 mmol/L   Chloride 110 98 - 111 mmol/L   CO2 22 22 - 32 mmol/L   Glucose, Bld 159 (H) 70 - 99 mg/dL   BUN 21 8 - 23 mg/dL   Creatinine, Ser 0.860.95 0.44 - 1.00 mg/dL   Calcium 8.5 (L) 8.9 - 10.3 mg/dL   Total Protein 6.7 6.5 - 8.1 g/dL   Albumin 3.5 3.5 - 5.0 g/dL   AST 37 15 - 41 U/L   ALT 23 0 - 44 U/L   Alkaline Phosphatase 70 38 - 126 U/L   Total Bilirubin 1.5 (H) 0.3 - 1.2 mg/dL   GFR calc non Af Amer >60 >60 mL/min   GFR calc Af Amer >60 >60 mL/min   Anion gap 8 5 - 15    Comment: Performed at [facility], [address] [place], [place] [address]  CBC     Status: Abnormal   Collection Time: 02/16/19  8:56 PM  Result Value Ref Range   WBC 5.5 4.0 - 10.5 K/uL   RBC 3.99 3.87 - 5.11 MIL/uL   Hemoglobin 9.6 (L) 12.0 - 15.0 g/dL   HCT 16.129.2 (L) 09.636.0 - 04.546.0 %   MCV 73.2 (L) 80.0 - 100.0 fL   MCH 24.1 (L) 26.0 - 34.0 pg   MCHC 32.9 30.0 - 36.0 g/dL   RDW 40.916.7 (H) 81.111.5 - 91.415.5 %   Platelets 92 (L) 150 - 400 K/uL    Comment: Immature Platelet Fraction may be clinically indicated, consider ordering this additional test [id number]    nRBC 0.0 0.0 - 0.2 %    Comment: Performed at [facility], [address] [place], [place] [address]  Type and screen [facility]     Status: None (Preliminary result)   Collection Time: 02/16/19  8:56 PM  Result Value Ref Range   ABO/RH(D) A POS    Antibody Screen NEG    Sample Expiration      02/19/2019,2359 Performed at [facility],  [address] [place], [place] [address]    Unit Number [id number]    Blood Component Type RED CELLS,LR    Unit division 00    Status of Unit ALLOCATED    Transfusion Status OK TO TRANSFUSE    Crossmatch Result Compatible    Unit Number [id number]    Blood Component Type RED CELLS,LR    Unit division 00    Status of Unit ALLOCATED    Transfusion Status OK TO TRANSFUSE    Crossmatch Result Compatible   Protime-INR - (order if Patient is taking Coumadin / Warfarin)     Status: None   Collection Time: 02/16/19  8:56 PM  Result Value Ref Range   Prothrombin Time 15.2 11.4 - 15.2 seconds   INR 1.2 0.8 - 1.2    Comment: (NOTE) INR goal varies based on device and disease states. Performed at [facility], [address] [place], [place] [address]   Troponin I (High Sensitivity)     Status: None   Collection Time: 02/16/19  8:56 PM  Result Value Ref Range   Troponin I (High Sensitivity) 6 <18 ng/L    Comment: (NOTE) Elevated high sensitivity troponin I (hsTnI) values and significant  changes across serial measurements may suggest ACS but many other  chronic and acute conditions are known to elevate hsTnI results.  Refer to the "Links" section for chest pain algorithms and additional  guidance. Performed at [facility], [address] [place], [place] [address]   Prepare RBC     Status: None   Collection Time: 02/16/19  9:47 PM  Result Value Ref Range   Order Confirmation      ORDER PROCESSED BY BLOOD BANK Performed at [facility], [address] [place], [place] [address]   SARS Coronavirus 2 [facility](Hospital order, Performed in [facility]) Nasopharyngeal Nasopharyngeal Swab     Status: None   Collection Time: 02/16/19 10:35 PM   Specimen: Nasopharyngeal Swab  Result Value Ref Range   SARS Coronavirus 2 NEGATIVE NEGATIVE    Comment: (NOTE) If result is NEGATIVE SARS-CoV-2 target nucleic acids are NOT DETECTED. The  SARS-CoV-2 RNA is generally detectable in upper and lower  respiratory specimens during the acute phase of infection. The lowest  concentration of SARS-CoV-2 viral copies this assay can detect is 250  copies / mL. A negative result does not preclude SARS-CoV-2 infection  and should not be used as the sole basis for treatment or other  patient management decisions.  A negative result may occur with  improper specimen collection / handling, submission of specimen other  than nasopharyngeal swab, presence of viral mutation(s) within the  areas targeted by this assay, and inadequate number of viral copies  (<250 copies / mL). A negative result must be combined with clinical  observations, patient history, and epidemiological information. If result is POSITIVE SARS-CoV-2 target nucleic acids are DETECTED. The SARS-CoV-2 RNA is generally detectable in upper and lower  respiratory specimens dur ing the acute phase of infection.  Positive  results are indicative of active infection with SARS-CoV-2.  Clinical  correlation with patient history and other diagnostic information is  necessary to determine patient infection status.  Positive results do  not rule out bacterial infection or co-infection with other viruses. If result is PRESUMPTIVE POSTIVE SARS-CoV-2 nucleic acids MAY BE PRESENT.   A presumptive positive result was obtained on the submitted specimen  and confirmed on repeat testing.  While 2019 novel coronavirus  (SARS-CoV-2) nucleic acids may be present in the submitted sample  additional confirmatory testing may be necessary for epidemiological  and / or clinical management purposes  to differentiate between  SARS-CoV-2 and other Sarbecovirus currently known to infect humans.  If clinically indicated additional testing with an alternate test  methodology [phone]([id number]) is advised. The SARS-CoV-2 RNA is generally  detectable in upper and lower respiratory sp ecimens during the acute   phase of infection. The expected result is Negative. Fact Sheet for Patients:  BoilerBrush.com.cyhttps://www.fda.gov/media/136312/download Fact Sheet for Healthcare Providers: https://pope.com/https://www.fda.gov/media/136313/download This test is not yet approved or cleared by the [place] FDA and has been authorized for detection and/or diagnosis of SARS-CoV-2 by FDA under an Emergency Use Authorization (EUA).  This EUA will remain in effect (meaning this test can be used) for the duration of the COVID-19 declaration under Section 564(b)(1) of the Act, 21 U.S.C. section 360bbb-3(b)(1), unless the authorization is terminated or revoked sooner. Performed at [facility], [address] [place], [place] [address]   ABO/Rh     Status: None   Collection Time: 02/16/19 10:35 PM  Result Value Ref Range   ABO/RH(D)      A POS Performed at [facility], [address] [place], [place] [address]    Dg Chest 2 View  Result Date: 02/16/2019 CLINICAL DATA:  Chest pain EXAM: CHEST - 2 VIEW COMPARISON:  None. FINDINGS: The heart size and mediastinal contours are within normal limits. Both lungs are clear. The visualized skeletal structures are unremarkable. IMPRESSION: No acute cardiopulmonary process. Electronically Signed   By: Jonna ClarkBindu  Avutu M.D.   On: 02/16/2019 21:10    Pending Labs Unresulted Labs (From admission, onward)    Start     Ordered   02/16/19 2204  Ferritin  Add-on,   AD     02/16/19 2203   02/16/19 2204  Iron and TIBC  Add-on,   AD     02/16/19 2203   02/16/19 2204  Vitamin B12  Add-on,   AD     02/16/19 2203   02/16/19 2204  Folate  Add-on,   AD     02/16/19 2203   Signed and Held  HIV antibody (Routine Testing)  Once,   R     Signed and Held   Signed and Held  Hemoglobin A1c  Once,   R    Comments: To assess prior  glycemic control    Signed and Held   Signed and Held  TSH  Once,   R     Signed and Held   Signed and Held  Occult blood card to lab, stool RN will collect   Once,   R    Question:  Specimen to be collected by:  Answer:  RN will collect   Signed and Held   Signed and Held  Basic metabolic panel  Tomorrow morning,   R     Signed and Held   Signed and Held  CBC  Tomorrow morning,   R     Signed and Held   Signed and Held  Protime-INR  Tomorrow morning,   R     Signed and Held   Signed and Held  Hemoglobin and hematocrit, blood  Now then every 6 hours,   R     Signed and Held          Vitals/Pain Today's Vitals   02/16/19 2118 02/16/19 2119 02/16/19 2120 02/16/19 2121  BP:      Pulse: 69 69 71 72  Resp: 14 15 15 14   Temp:      TempSrc:      SpO2: 100% 100% 100% 100%  Weight:      PainSc:        Isolation Precautions No active isolations  Medications Medications  pantoprazole (PROTONIX) 80 mg in sodium chloride 0.9 % 250 mL (0.32 mg/mL) infusion (8 mg/hr Intravenous New Bag/Given 02/16/19 2307)  pantoprazole (PROTONIX) injection 40 mg (has no administration in time range)  octreotide (SANDOSTATIN) 2 mcg/mL load via infusion 50 mcg (50 mcg Intravenous Bolus from Bag 02/16/19 2233)    And  octreotide (SANDOSTATIN) 500 mcg in sodium chloride 0.9 % 250 mL (2 mcg/mL) infusion (50 mcg/hr Intravenous New Bag/Given 02/16/19 2231)  0.9 %  sodium chloride infusion (has no administration in time range)  cefTRIAXone (ROCEPHIN) 2 g in sodium chloride 0.9 % 100 mL IVPB (0 g Intravenous Stopped 02/16/19 2302)  potassium chloride 10 mEq in 100 mL IVPB (has no administration in time range)  ondansetron (ZOFRAN) injection 4 mg (4 mg Intravenous Given 02/16/19 2058)  pantoprazole (PROTONIX) 80 mg in sodium chloride 0.9 % 100 mL IVPB (0 mg Intravenous Stopped 02/16/19 2249)    Mobility walks Low fall risk   Focused Assessments GI bleed   R Recommendations: See Admitting Provider Note  Report given to:   Additional Notes:

## 2019-02-17 NOTE — Anesthesia Postprocedure Evaluation (Signed)
Anesthesia Post Note  Patient: [name]  Procedure(s) Performed: ESOPHAGOGASTRODUODENOSCOPY (EGD) (N/A )  Patient location during evaluation: Endoscopy Anesthesia Type: General Level of consciousness: awake and alert Pain management: pain level controlled Vital Signs Assessment: post-procedure vital signs reviewed and stable Respiratory status: spontaneous breathing, nonlabored ventilation and respiratory function stable Cardiovascular status: blood pressure returned to baseline and stable Postop Assessment: no apparent nausea or vomiting Anesthetic complications: no     Last Vitals:  Vitals:   02/17/19 1200 02/17/19 1300  BP:  (!) 159/76  Pulse: 66 62  Resp: 17 17  Temp: 37 C   SpO2: 100% 95%    Last Pain:  Vitals:   02/17/19 1200  TempSrc: Axillary  PainSc: 3                  Charnelle Bergeman Harvie Heck

## 2019-02-17 NOTE — Anesthesia Post-op Follow-up Note (Signed)
Anesthesia QCDR form completed.        

## 2019-02-17 NOTE — Consult Note (Signed)
PULMONARY / CRITICAL CARE MEDICINE  Name: Kirsten LunchMabel G Ware MRN: [id number] DOB: 07/26/1955    LOS: 1  Referring Provider: Janeann MerlAngela Seals, NP Reason for Referral: Acute GI bleed  HPI: 63 year old female with a medical history as indicated below who presented to the ED with complaints of hematemesis, black tarry stools, diarrhea and midsternal chest pain.  Patient reported one episode of bloody emesis and formed loose stools with stool being dark and tarry.  She also had 1 watery bloody stool in the emergency room.  Her ED work-up revealed a hemoglobin of 8.2, a hematocrit of 24.9 and platelet count of 66.  She is being admitted to the ICU for close monitoring.  She is scheduled for an EGD in the morning She has a history of chronic anemia and [facility]. last infusion was 3 months ago.  Patient also states that she had an [facility] and [facility] at the [facility] and was told that she had thin areas in the esophagus that could rupture at any time. She reports taking 200 to 400 mg of Motrin daily for her headache as needed.  Past Medical History:  Diagnosis Date  . Diabetes mellitus without complication (HCC)   . Hypertension   . Irritable bowel    Past Surgical History:  Procedure Laterality Date  . SHOULDER ARTHROTOMY    . TOTAL KNEE ARTHROPLASTY     No current facility-administered medications on file prior to encounter.    Current Outpatient Medications on File Prior to Encounter  Medication Sig  . ibuprofen (ADVIL,MOTRIN) 200 MG tablet Take 400 mg by mouth every 6 (six) hours as needed for headache.  Marland Kitchen. CALCIUM PO Take 1 tablet by mouth 2 (two) times daily.  Marland Kitchen. dicyclomine (BENTYL) 20 MG tablet Take 1 tablet (20 mg total) by mouth 3 (three) times daily as needed for spasms (abdominal cramping). (Patient not taking: Reported on 02/16/2019)  . esomeprazole (NEXIUM) 40 MG capsule Take 1 capsule (40 mg total) by mouth daily. (Patient not taking: Reported on 02/16/2019)  .  hydrochlorothiazide (HYDRODIURIL) 25 MG tablet Take 25 mg by mouth daily.  Marland Kitchen. loperamide (LOPERAMIDE A-D) 2 MG tablet Take 2 mg by mouth 2 (two) times daily as needed for diarrhea or loose stools.  . meclizine (ANTIVERT) 25 MG tablet Take 1 tablet (25 mg total) by mouth 3 (three) times daily as needed for dizziness. (Patient not taking: Reported on 02/16/2019)  . METFORMIN HCL PO Take 2 tablets by mouth 2 (two) times daily.  . ondansetron (ZOFRAN ODT) 4 MG disintegrating tablet Take 1 tablet (4 mg total) by mouth every 8 (eight) hours as needed for nausea or vomiting. (Patient not taking: Reported on 02/16/2019)  . POTASSIUM PO Take 1 tablet by mouth daily.  Marland Kitchen. PRESCRIPTION MEDICATION Take 1 tablet by mouth 2 (two) times daily.    Allergies Allergies  Allergen Reactions  . Hydrocodone-Acetaminophen Nausea And Vomiting  . Codeine Nausea And Vomiting  . Lactose Intolerance (Gi)     Family History Family History  Problem Relation Age of Onset  . Osteoarthritis Mother   . Cancer Father    Social History  reports that she has never smoked. She has never used smokeless tobacco. She reports that she does not drink alcohol or use drugs.  Review Of Systems:  Constitutional: Negative for fever and chills.  HENT: Negative for congestion and rhinorrhea.  Eyes: Negative for redness and visual disturbance.  Respiratory: Negative for shortness of breath and  wheezing.  Cardiovascular: Negative for chest pain and palpitations.  Gastrointestinal: Positive for hematemesis, and and bloody loose stools Genitourinary: Negative for dysuria and urgency.  Endocrine: Denies polyuria, polyphagia and heat intolerance Musculoskeletal: Negative for myalgias and arthralgias.  Skin: Negative for pallor and wound.  Neurological: Positive for mild dizziness and occasional headaches   VITAL SIGNS: BP 129/78   Pulse (!) 58   Temp (!) 97.5 F (36.4 C) (Oral)   Resp 16   Ht 5\' 2"  (1.575 m)   Wt 78.9 kg   SpO2  97%   BMI 31.81 kg/m   HEMODYNAMICS:    VENTILATOR SETTINGS:    INTAKE / OUTPUT: No intake/output data recorded.  PHYSICAL EXAMINATION: General: Well-nourished, in no acute distress HEENT: PERRLA, trachea midline, no JVD Neuro: Alert and oriented x4, no focal deficits Cardiovascular: RRR, S1-S2, no murmur regurg or gallop, +2 pulses bilateral, no edema Lungs: Clear to auscultation bilaterally Abdomen: Nondistended, normal bowel sounds in all 4 quadrants, palpation reveals no organomegaly Musculoskeletal: Positive range of motion, no joint deformity Skin: Warm and dry  LABS:  BMET Recent Labs  Lab 02/16/19 2056 02/17/19 0443  NA 140 142  K 3.2* 4.0  CL 110 116*  CO2 22 21*  BUN 21 19  CREATININE 0.95 0.72  GLUCOSE 159* 154*    Electrolytes Recent Labs  Lab 02/16/19 2056 02/17/19 0443  CALCIUM 8.5* 8.1*    CBC Recent Labs  Lab 02/16/19 2056 02/17/19 0013 02/17/19 0443  WBC 5.5 2.9* 2.4*  HGB 9.6* 8.2* 7.6*  HCT 29.2* 24.9* 23.4*  PLT 92* 66* 61*    Coag's Recent Labs  Lab 02/16/19 2056 02/17/19 0443  INR 1.2 1.3*    Sepsis Markers No results for input(s): LATICACIDVEN, PROCALCITON, O2SATVEN in the last 168 hours.  ABG No results for input(s): PHART, PCO2ART, PO2ART in the last 168 hours.  Liver Enzymes Recent Labs  Lab 02/16/19 2056  AST 37  ALT 23  ALKPHOS 70  BILITOT 1.5*  ALBUMIN 3.5    Cardiac Enzymes No results for input(s): TROPONINI, PROBNP in the last 168 hours.  Glucose Recent Labs  Lab 02/17/19 0242  GLUCAP 132*    Imaging Dg Chest 2 View  Result Date: 02/16/2019 CLINICAL DATA:  Chest pain EXAM: CHEST - 2 VIEW COMPARISON:  None. FINDINGS: The heart size and mediastinal contours are within normal limits. Both lungs are clear. The visualized skeletal structures are unremarkable. IMPRESSION: No acute cardiopulmonary process. Electronically Signed   By: Prudencio Pair M.D.   On: 02/16/2019 21:10   STUDIES:   None  CULTURES: None  ANTIBIOTICS: None  SIGNIFICANT EVENTS: 02/17/2019: Admitted  LINES/TUBES: Peripheral IV  DISCUSSION: 63 year old female with a history of IBS, diabetes and hypertension presenting with acute GI bleed  ASSESSMENT Acute GI bleed Chronic anemia Type 2 diabetes Hypertension Irritable bowel syndrome  PLAN Hemodynamic monitoring per ICU protocol Serial CBCs and transfuse with hemoglobin 7 and below Protonix infusion and octreotide infusion per GI GI consulted, plan is for an EGD today Glucose monitoring with sliding scale insulin coverage Hold BP medications for systolic blood pressure less than 100 No NSAIDs  Best Practice: Code Status: Full code Diet: N.p.o. GI prophylaxis: Already on a Protonix infusion VTE prophylaxis: SCDs, pharmacologic VTE prophylaxis contraindicated  FAMILY  - Updates: Patient has decision-making capacity.  Updated on current treatment plan.  Magdalene S. Tukov-Yual ANP-BC Pulmonary and [place] Pager [phone] or [phone]  NB: This document was prepared  using Conservation officer, historic buildingsDragon voice recognition software and may include unintentional dictation errors.  ADDENDUM: At shift change, patient had a large bloody BM and complained of lightheadedness and dizziness.  A stat order was placed for 1 unit of packed red blood cells. Nurse to place order for CBC posttransfusion   02/17/2019, 5:54 AM

## 2019-02-17 NOTE — Anesthesia Procedure Notes (Signed)
Date/Time: 02/17/2019 9:49 AM Performed by: Doreen Salvage, CRNA Pre-anesthesia Checklist: Patient identified, Emergency Drugs available, Suction available and Patient being monitored Patient Re-evaluated:Patient Re-evaluated prior to induction Oxygen Delivery Method: Nasal cannula Induction Type: IV induction Dental Injury: Teeth and Oropharynx as per pre-operative assessment  Comments: Nasal cannula with etCO2 monitoring

## 2019-02-17 NOTE — Progress Notes (Signed)
PT off the floor for EGD.

## 2019-02-17 NOTE — ED Notes (Signed)
Patient has not had bloody BM since 2245.

## 2019-02-17 NOTE — Consult Note (Signed)
Cephas Darby, MD [address]  [place]  [place], [place] [address]  Main: [phone]  Fax: [phone] Pager: [phone]   Consultation  Referring Provider:     No ref. provider found Primary Care Physician:  Patient, No Pcp Per Primary Gastroenterologist: [facility], [facility] Reason for Consultation:     Hematemesis  Date of Admission:  02/16/2019 Date of Consultation:  02/17/2019         HPI:   [name] is a 63 y.o. female with known history of cirrhosis of liver, esophageal varices who sees gastroenterology and hepatology at the [facility], [place] presented to ER yesterday night with 1 episode of bloody emesis, large-volume with clots as well as black tarry to maroon bloody bowel movements.  She had one episode of hematochezia in the ER as well.  On presentation, she was mildly hypertensive, hemoglobin was 9.6 on arrival, platelets 92.  LFTs reveal mildly elevated total bilirubin, normal BUN/creatinine.  Last hemoglobin was 10.8 in 3/20, with platelets 67.  I recommended patient to be admitted to ICU, started on octreotide, Protonix drips, ceftriaxone for SBP prophylaxis, keep n.p.o. with the plan to do upper endoscopy in the morning when she is resuscitated adequately.  Patient did not have any acute events overnight in the ICU.  This morning, she reported having another episode of hematochezia.  She denies abdominal pain, but feels queasy in her stomach, has mild abdominal distention, swelling of legs, denies fever, chills, nausea or vomiting.  She says her cirrhosis of liver is secondary to long-term damage from NSAID use for arthritis.  She was taking ibuprofen 800 mg for several years.  Currently she is taking 200 to 400 mg daily.  She reported having an upper endoscopy and colonoscopy at the [place] about an year ago and was told about the esophageal varices.  She denies having hematemesis or banding in the past.  I do not have access to the [place] records Patient does not smoke or drink  alcohol  Patient had a CT abdomen and pelvis with contrast in 09/2018 which revealed cirrhosis of liver, ascites, splenomegaly  NSAIDs: Ibuprofen 200-400 mg daily  Antiplts/Anticoagulants/Anti thrombotics: None  GI Procedures: EGD and colonoscopy 1 year ago at the [place]  Past Medical History:  Diagnosis Date  . Diabetes mellitus without complication ([place])   . Hypertension   . Irritable bowel     Past Surgical History:  Procedure Laterality Date  . SHOULDER ARTHROTOMY    . TOTAL KNEE ARTHROPLASTY      Prior to Admission medications   Medication Sig Start Date End Date Taking? Authorizing Provider  ibuprofen (ADVIL,MOTRIN) 200 MG tablet Take 400 mg by mouth every 6 (six) hours as needed for headache.   Yes [provider]  CALCIUM PO Take 1 tablet by mouth 2 (two) times daily.    [provider]  dicyclomine (BENTYL) 20 MG tablet Take 1 tablet (20 mg total) by mouth 3 (three) times daily as needed for spasms (abdominal cramping). Patient not taking: Reported on 02/16/2019 09/18/18   Long, Wonda Olds, MD  esomeprazole (NEXIUM) 40 MG capsule Take 1 capsule (40 mg total) by mouth daily. Patient not taking: Reported on 02/16/2019 05/07/17   Drenda Freeze, MD  hydrochlorothiazide (HYDRODIURIL) 25 MG tablet Take 25 mg by mouth daily.    [provider]  loperamide (LOPERAMIDE A-D) 2 MG tablet Take 2 mg by mouth 2 (two) times daily as needed for diarrhea or  loose stools.    [provider]  meclizine (ANTIVERT) 25 MG tablet Take 1 tablet (25 mg total) by mouth 3 (three) times daily as needed for dizziness. Patient not taking: Reported on 02/16/2019 05/07/17   Drenda Freeze, MD  METFORMIN HCL PO Take 2 tablets by mouth 2 (two) times daily.    [provider]  ondansetron (ZOFRAN ODT) 4 MG disintegrating tablet Take 1 tablet (4 mg total) by mouth every 8 (eight) hours as needed for nausea or vomiting. Patient not taking: Reported on 02/16/2019  09/18/18   Long, Wonda Olds, MD  POTASSIUM PO Take 1 tablet by mouth daily.    [provider]  PRESCRIPTION MEDICATION Take 1 tablet by mouth 2 (two) times daily.    [provider]   Current Facility-Administered Medications:  .  [MAR Hold] 0.9 %  sodium chloride infusion (Manually program via Guardrails IV Fluids), , Intravenous, Once, Tukov-Yual, Magdalene S, NP .  [MAR Hold] 0.9 %  sodium chloride infusion, 10 mL/hr, Intravenous, Once, Nena Polio, MD .  0.9 %  sodium chloride infusion, , Intravenous, Continuous, Seals, Angela H, NP, Last Rate: 75 mL/hr at 02/17/19 0600 .  [MAR Hold] cefTRIAXone (ROCEPHIN) 2 g in sodium chloride 0.9 % 100 mL IVPB, 2 g, Intravenous, Q24H, Juel Bellerose, Tally Due, MD, Stopped at 02/16/19 2302 .  ferumoxytol (FERAHEME) 510 mg in sodium chloride 0.9 % 100 mL IVPB, 510 mg, Intravenous, Once, Natane Heward, Tally Due, MD .  Doug Sou Hold] hydrALAZINE (APRESOLINE) injection 10 mg, 10 mg, Intravenous, Q6H PRN, Seals, Theo Dills, NP .  Doug Sou Hold] insulin aspart (novoLOG) injection 0-15 Units, 0-15 Units, Subcutaneous, TID WC, Seals, Theo Dills, NP, 2 Units at 02/17/19 [phone] .  [MAR Hold] insulin aspart (novoLOG) injection 0-5 Units, 0-5 Units, Subcutaneous, QHS, Seals, Angela H, NP .  [COMPLETED] octreotide (SANDOSTATIN) 2 mcg/mL load via infusion 50 mcg, 50 mcg, Intravenous, Once, 50 mcg at 02/16/19 2233 **AND** octreotide (SANDOSTATIN) 500 mcg in sodium chloride 0.9 % 250 mL (2 mcg/mL) infusion, 50 mcg/hr, Intravenous, Continuous, Nena Polio, MD, Last Rate: 25 mL/hr at 02/17/19 0600, 50 mcg/hr at 02/17/19 0600 .  [MAR Hold] ondansetron (ZOFRAN) tablet 4 mg, 4 mg, Oral, Q6H PRN **OR** [MAR Hold] ondansetron (ZOFRAN) injection 4 mg, 4 mg, Intravenous, Q6H PRN, Seals, Angela H, NP .  pantoprazole (PROTONIX) 80 mg in sodium chloride 0.9 % 250 mL (0.32 mg/mL) infusion, 8 mg/hr, Intravenous, Continuous, Nena Polio, MD, Last Rate: 25 mL/hr at 02/17/19 0600, 8 mg/hr  at 02/17/19 0600 .  [MAR Hold] pantoprazole (PROTONIX) injection 40 mg, 40 mg, Intravenous, Q12H, Nena Polio, MD   Family History  Problem Relation Age of Onset  . Osteoarthritis Mother   . Cancer Father      Social History   Tobacco Use  . Smoking status: Never Smoker  . Smokeless tobacco: Never Used  Substance Use Topics  . Alcohol use: No  . Drug use: No    Allergies as of 02/16/2019 - Review Complete 02/16/2019  Allergen Reaction Noted  . Hydrocodone-acetaminophen Nausea And Vomiting 04/28/2012  . Codeine Nausea And Vomiting 05/07/2017  . Lactose intolerance (gi)  05/07/2017    Review of Systems:    All systems reviewed and negative except where noted in HPI.   Physical Exam:  Vital signs in last 24 hours: Temp:  [97.5 F (36.4 C)-98.4 F (36.9 C)] 97.8 F (36.6 C) (08/15 0800) Pulse Rate:  [58-76] 73 (08/15 0916) Resp:  [  11-23] 14 (08/15 0916) BP: (128-171)/(59-90) 171/83 (08/15 0916) SpO2:  [97 %-100 %] 99 % (08/15 0916) Weight:  [77 kg-78.9 kg] 78.9 kg (08/15 0247) Last BM Date: 02/17/19 General:   Pleasant, cooperative in NAD Head:  Normocephalic and atraumatic. Eyes:   No icterus.   Conjunctiva pink. PERRLA. Ears:  Normal auditory acuity. Neck:  Supple; no masses or thyroidomegaly Lungs: Respirations even and unlabored. Lungs clear to auscultation bilaterally.   No wheezes, crackles, or rhonchi.  Heart:  Regular rate and rhythm;  Without murmur, clicks, rubs or gallops Abdomen:  Soft, moderately distended, dull to percussion, nontender. Normal bowel sounds. No appreciable masses or hepatomegaly.  No rebound or guarding.  Rectal:  Not performed. Msk:  Symmetrical without gross deformities.  Extremities:  Without edema, cyanosis or clubbing. Neurologic:  Alert and oriented x3;  grossly normal neurologically. Skin:  Intact without significant lesions or rashes. Psych:  Alert and cooperative. Normal affect.  LAB RESULTS: CBC Latest Ref Rng & Units  02/17/2019 02/17/2019 02/16/2019  WBC 4.0 - 10.5 K/uL 2.4(L) 2.9(L) 5.5  Hemoglobin 12.0 - 15.0 g/dL 7.6(L) 8.2(L) 9.6(L)  Hematocrit 36.0 - 46.0 % 23.4(L) 24.9(L) 29.2(L)  Platelets 150 - 400 K/uL 61(L) 66(L) 92(L)    BMET BMP Latest Ref Rng & Units 02/17/2019 02/16/2019 09/18/2018  Glucose 70 - 99 mg/dL 154(H) 159(H) 190(H)  BUN 8 - 23 mg/dL '19 21 20  ' Creatinine 0.44 - 1.00 mg/dL 0.72 0.95 0.82  Sodium 135 - 145 mmol/L 142 140 139  Potassium 3.5 - 5.1 mmol/L 4.0 3.2(L) 4.1  Chloride 98 - 111 mmol/L 116(H) 110 111  CO2 22 - 32 mmol/L 21(L) 22 22  Calcium 8.9 - 10.3 mg/dL 8.1(L) 8.5(L) 8.8(L)    LFT Hepatic Function Latest Ref Rng & Units 02/16/2019 09/18/2018 05/06/2017  Total Protein 6.5 - 8.1 g/dL 6.7 7.2 6.8  Albumin 3.5 - 5.0 g/dL 3.5 3.9 3.7  AST 15 - 41 U/L 37 39 47(H)  ALT 0 - 44 U/L '23 23 26  ' Alk Phosphatase 38 - 126 U/L 70 75 74  Total Bilirubin 0.3 - 1.2 mg/dL 1.5(H) 1.4(H) 1.0  Bilirubin, Direct 0.1 - 0.5 mg/dL - - 0.1     STUDIES: Dg Chest 2 View  Result Date: 02/16/2019 CLINICAL DATA:  Chest pain EXAM: CHEST - 2 VIEW COMPARISON:  None. FINDINGS: The heart size and mediastinal contours are within normal limits. Both lungs are clear. The visualized skeletal structures are unremarkable. IMPRESSION: No acute cardiopulmonary process. Electronically Signed   By: Prudencio Pair M.D.   On: 02/16/2019 21:10      Impression / Plan:   SHALAINE PAYSON is a 63 y.o. pleasant Caucasian female with known cirrhosis of liver, presumed to be secondary to long-term NSAID use admitted with hematemesis, melena/hematochezia   Hematemesis: Suspect bleeding from esophageal varices or peptic ulcer disease from long-term NSAID use or dieulafoy's EGD performed today Continue pantoprazole and octreotide drip for 72 hours Long-term proton pump inhibitor due to long-term NSAID use Continue ceftriaxone for SBP prophylaxis Monitor CBC closely, keep Hb>7, platelets greater than 50 Recommend  paracentesis to evaluate for SBP as it can be a sign of decompensation that leads to upper GI bleed  Decompensated cirrhosis of liver Recommend diuretics as she is volume overloaded Check viral hepatitis panel Avoid NSAIDs Recommend ultrasound with Dopplers to evaluate for any hepatic or portal vein thrombosis   Thank you for involving me in the care of this patient.  Will follow  along with you    LOS: 1 day   Sherri Sear, MD  02/17/2019, 9:29 AM   Note: This dictation was prepared with Dragon dictation along with smaller phrase technology. Any transcriptional errors that result from this process are unintentional.

## 2019-02-17 NOTE — Anesthesia Preprocedure Evaluation (Addendum)
Anesthesia Evaluation  Patient identified by MRN, date of birth, ID band Patient awake    Reviewed: Allergy & Precautions, H&P , NPO status , reviewed documented beta blocker date and time   Airway Mallampati: II  TM Distance: >3 FB Neck ROM: full    Dental  (+) Edentulous Upper, Edentulous Lower, Upper Dentures, Lower Dentures   Pulmonary    Pulmonary exam normal        Cardiovascular hypertension, Normal cardiovascular exam     Neuro/Psych    GI/Hepatic   Endo/Other  diabetes  Renal/GU      Musculoskeletal   Abdominal   Peds  Hematology   Anesthesia Other Findings Past Medical History: No date: Diabetes mellitus without complication (HCC) No date: Hypertension No date: Irritable bowel  Past Surgical History: No date: SHOULDER ARTHROTOMY No date: TOTAL KNEE ARTHROPLASTY  BMI    Body Mass Index: 31.81 kg/m      Reproductive/Obstetrics                            Anesthesia Physical Anesthesia Plan  ASA: III and emergent  Anesthesia Plan: General   Post-op Pain Management:    Induction: Intravenous  PONV Risk Score and Plan: 2 and TIVA  Airway Management Planned: Nasal Cannula and Natural Airway  Additional Equipment:   Intra-op Plan:   Post-operative Plan:   Informed Consent: I have reviewed the patients History and Physical, chart, labs and discussed the procedure including the risks, benefits and alternatives for the proposed anesthesia with the patient or authorized representative who has indicated his/her understanding and acceptance.     Dental Advisory Given  Plan Discussed with: CRNA  Anesthesia Plan Comments:         Anesthesia Quick Evaluation

## 2019-02-18 DIAGNOSIS — I8511 Secondary esophageal varices with bleeding: Secondary | ICD-10-CM

## 2019-02-18 DIAGNOSIS — D696 Thrombocytopenia, unspecified: Secondary | ICD-10-CM

## 2019-02-18 LAB — GLUCOSE, CAPILLARY
Glucose-Capillary: 141 mg/dL — ABNORMAL HIGH (ref 70–99)
Glucose-Capillary: 167 mg/dL — ABNORMAL HIGH (ref 70–99)
Glucose-Capillary: 162 mg/dL — ABNORMAL HIGH (ref 70–99)
Glucose-Capillary: 142 mg/dL — ABNORMAL HIGH (ref 70–99)

## 2019-02-18 LAB — COMPREHENSIVE METABOLIC PANEL
ALT: 23 U/L (ref 0–44)
AST: 40 U/L (ref 15–41)
Albumin: 3.4 g/dL — ABNORMAL LOW (ref 3.5–5.0)
Alkaline Phosphatase: 66 U/L (ref 38–126)
Anion gap: 6 (ref 5–15)
BUN: 14 mg/dL (ref 8–23)
CO2: 21 mmol/L — ABNORMAL LOW (ref 22–32)
Calcium: 8.6 mg/dL — ABNORMAL LOW (ref 8.9–10.3)
Chloride: 110 mmol/L (ref 98–111)
Creatinine, Ser: 0.8 mg/dL (ref 0.44–1.00)
GFR calc Af Amer: >60 mL/min (ref >60)
GFR calc non Af Amer: >60 mL/min (ref >60)
Glucose, Bld: 147 mg/dL — ABNORMAL HIGH (ref 70–99)
Potassium: 3.6 mmol/L (ref 3.5–5.1)
Sodium: 137 mmol/L (ref 135–145)
Total Bilirubin: 1.4 mg/dL — ABNORMAL HIGH (ref 0.3–1.2)
Total Protein: 6.1 g/dL — ABNORMAL LOW (ref 6.5–8.1)

## 2019-02-18 LAB — CBC
HCT: 24.2 % — ABNORMAL LOW (ref 36.0–46.0)
HCT: 27.4 % — ABNORMAL LOW (ref 36.0–46.0)
Hemoglobin: 7.9 g/dL — ABNORMAL LOW (ref 12.0–15.0)
Hemoglobin: 8.7 g/dL — ABNORMAL LOW (ref 12.0–15.0)
MCH: 24.3 pg — ABNORMAL LOW (ref 26.0–34.0)
MCH: 24 pg — ABNORMAL LOW (ref 26.0–34.0)
MCHC: 32.6 g/dL (ref 30.0–36.0)
MCHC: 31.8 g/dL (ref 30.0–36.0)
MCV: 74.5 fL — ABNORMAL LOW (ref 80.0–100.0)
MCV: 75.5 fL — ABNORMAL LOW (ref 80.0–100.0)
Platelets: 63 10*3/uL — ABNORMAL LOW (ref 150–400)
Platelets: 66 10*3/uL — ABNORMAL LOW (ref 150–400)
RBC: 3.25 MIL/uL — ABNORMAL LOW (ref 3.87–5.11)
RBC: 3.63 MIL/uL — ABNORMAL LOW (ref 3.87–5.11)
RDW: 16.8 % — ABNORMAL HIGH (ref 11.5–15.5)
RDW: 17.1 % — ABNORMAL HIGH (ref 11.5–15.5)
WBC: 2.5 10*3/uL — ABNORMAL LOW (ref 4.0–10.5)
WBC: 3.2 10*3/uL — ABNORMAL LOW (ref 4.0–10.5)
nRBC: 0 % (ref 0.0–0.2)
nRBC: 0 % (ref 0.0–0.2)

## 2019-02-18 LAB — PHOSPHORUS: Phosphorus: 4.4 mg/dL (ref 2.5–4.6)

## 2019-02-18 LAB — MAGNESIUM: Magnesium: 2.1 mg/dL (ref 1.7–2.4)

## 2019-02-18 LAB — HIV ANTIBODY (ROUTINE TESTING W REFLEX): HIV Screen 4th Generation wRfx: NONREACTIVE

## 2019-02-18 MED ORDER — SODIUM CHLORIDE 0.9 % IV SOLN
300.0000 mg | Freq: Once | INTRAVENOUS | Status: AC
  Administered 2019-02-19: 300 mg via INTRAVENOUS
  Filled 2019-02-18: qty 15

## 2019-02-18 MED ORDER — PROMETHAZINE HCL 25 MG PO TABS
25.0000 mg | ORAL_TABLET | Freq: Once | ORAL | Status: AC
  Administered 2019-02-18: 13:00:00 25 mg via ORAL
  Filled 2019-02-18: qty 1

## 2019-02-18 MED ORDER — DIPHENHYDRAMINE HCL 25 MG PO CAPS
25.0000 mg | ORAL_CAPSULE | Freq: Four times a day (QID) | ORAL | Status: DC | PRN
  Filled 2019-02-18: qty 1

## 2019-02-18 NOTE — Progress Notes (Signed)
Kirsten Darby, MD [address]  [place]  [place], [place] [address]  Main: [phone]  Fax: [phone] Pager: [phone]   Subjective: Patient underwent EGD yesterday, found to have esophageal varices with stigmata of recent bleeding, placed 4 bands.  She did not have any issues overnight.  This morning, she had 2 dark maroon bowel movements, reports nausea, burning in her chest.  Her vitals have been stable, blood pressure high.  Also, complaining of headaches.  Hemoglobin stable since yesterday morning   Objective: Vital signs in last 24 hours: Vitals:   02/18/19 0800 02/18/19 0900 02/18/19 1000 02/18/19 1100  BP:  (!) 142/75    Pulse: 71 68    Resp: 15 13 (!) 21 10  Temp:      TempSrc:      SpO2: 97% 93%    Weight:      Height:       Weight change:   Intake/Output Summary (Last 24 hours) at 02/18/2019 1211 Last data filed at 02/18/2019 0900 Gross per 24 hour  Intake 977.07 ml  Output 350 ml  Net 627.07 ml     Exam: Heart:: Regular rate and rhythm, S1S2 present or without murmur or extra heart sounds Lungs: normal and clear to auscultation Abdomen: soft, nontender, normal bowel sounds   Lab Results: @LABTEST2 @ Micro Results: Recent Results (from the past 240 hour(s))  SARS Coronavirus 2 [facility] order, Performed in [facility] hospital lab) Nasopharyngeal Nasopharyngeal Swab     Status: None   Collection Time: 02/16/19 10:35 PM   Specimen: Nasopharyngeal Swab  Result Value Ref Range Status   SARS Coronavirus 2 NEGATIVE NEGATIVE Final    Comment: (NOTE) If result is NEGATIVE SARS-CoV-2 target nucleic acids are NOT DETECTED. The SARS-CoV-2 RNA is generally detectable in upper and lower  respiratory specimens during the acute phase of infection. The lowest  concentration of SARS-CoV-2 viral copies this assay can detect is 250  copies / mL. A negative result does not preclude SARS-CoV-2 infection  and should not be used as the sole basis for  treatment or other  patient management decisions.  A negative result may occur with  improper specimen collection / handling, submission of specimen other  than nasopharyngeal swab, presence of viral mutation(s) within the  areas targeted by this assay, and inadequate number of viral copies  (<250 copies / mL). A negative result must be combined with clinical  observations, patient history, and epidemiological information. If result is POSITIVE SARS-CoV-2 target nucleic acids are DETECTED. The SARS-CoV-2 RNA is generally detectable in upper and lower  respiratory specimens dur ing the acute phase of infection.  Positive  results are indicative of active infection with SARS-CoV-2.  Clinical  correlation with patient history and other diagnostic information is  necessary to determine patient infection status.  Positive results do  not rule out bacterial infection or co-infection with other viruses. If result is PRESUMPTIVE POSTIVE SARS-CoV-2 nucleic acids MAY BE PRESENT.   A presumptive positive result was obtained on the submitted specimen  and confirmed on repeat testing.  While 2019 novel coronavirus  (SARS-CoV-2) nucleic acids may be present in the submitted sample  additional confirmatory testing may be necessary for epidemiological  and / or clinical management purposes  to differentiate between  SARS-CoV-2 and other Sarbecovirus currently known to infect humans.  If clinically indicated additional testing with an alternate test  methodology [phone]) is advised. The SARS-CoV-2 RNA is generally  detectable in upper  and lower respiratory sp ecimens during the acute  phase of infection. The expected result is Negative. Fact Sheet for Patients:  BoilerBrush.com.cyhttps://www.fda.gov/media/136312/download Fact Sheet for Healthcare Providers: https://pope.com/https://www.fda.gov/media/136313/download This test is not yet approved or cleared by the [place] FDA and has been authorized for detection and/or  diagnosis of SARS-CoV-2 by FDA under an Emergency Use Authorization (EUA).  This EUA will remain in effect (meaning this test can be used) for the duration of the COVID-19 declaration under Section 564(b)(1) of the Act, 21 U.S.C. section 360bbb-3(b)(1), unless the authorization is terminated or revoked sooner. Performed at [facility], [address] [place], [place] [address]   MRSA PCR Screening     Status: None   Collection Time: 02/17/19  2:53 AM   Specimen: Nasopharyngeal  Result Value Ref Range Status   MRSA by PCR NEGATIVE NEGATIVE Final    Comment:        The GeneXpert MRSA Assay (FDA approved for NASAL specimens only), is one component of a comprehensive MRSA colonization surveillance program. It is not intended to diagnose MRSA infection nor to guide or monitor treatment for MRSA infections. Performed at [facility], [address] [place], [place] [address]    Studies/Results: Dg Chest 2 View  Result Date: 02/16/2019 CLINICAL DATA:  Chest pain EXAM: CHEST - 2 VIEW COMPARISON:  None. FINDINGS: The heart size and mediastinal contours are within normal limits. Both lungs are clear. The visualized skeletal structures are unremarkable. IMPRESSION: No acute cardiopulmonary process. Electronically Signed   By: Jonna ClarkBindu  Avutu M.D.   On: 02/16/2019 21:10   [place] Liver Doppler  Result Date: 02/17/2019 CLINICAL DATA:  63 year old with history of cirrhosis and esophageal varices. Recently presented with bloody emesis. EXAM: DUPLEX ULTRASOUND OF LIVER TECHNIQUE: Color and duplex Doppler ultrasound was performed to evaluate the hepatic in-flow and out-flow vessels. COMPARISON:  CT 09/18/2018 FINDINGS: Liver: Nodular hepatic contour is compatible with cirrhosis. Liver parenchyma is mildly heterogeneous. Gallbladder is mild-to-moderately distended with multiple echogenic stones. Trace perihepatic ascites. No focal lesion, mass or intrahepatic biliary ductal dilatation.  Main Portal Vein size: 1.3 cm Portal Vein Velocities Main Prox:  31 cm/sec Main Mid: 25 cm/sec Main Dist:  26 cm/sec Right: 11 cm/sec Left: 16 cm/sec Hepatic Vein Velocities Right:  30 cm/sec Middle:  27 cm/sec Left:  18 cm/sec IVC: Present and patent with normal respiratory phasicity. Hepatic Artery Velocity:  71 cm/sec Splenic Vein Velocity:  25 cm/sec Spleen: 18 cm x 9 cm x 11 cm with a total volume of 912 cm^3 (411 cm^3 is upper limit normal) Portal Vein Occlusion/Thrombus: No Splenic Vein Occlusion/Thrombus: No Ascites: Present Varices: None Normal hepatopetal flow in the portal venous system. Normal hepatofugal flow in the hepatic veins. Perisplenic ascites. IMPRESSION: 1. Cirrhosis with portal hypertension demonstrated by splenomegaly and ascites. 2. Portal venous system is patent with normal direction of flow. 3. Cholelithiasis. Electronically Signed   By: Richarda OverlieAdam  Henn M.D.   On: 02/17/2019 14:03   Medications:  I have reviewed the patient's current medications. Prior to Admission:  Medications Prior to Admission  Medication Sig Dispense Refill Last Dose   ibuprofen (ADVIL,MOTRIN) 200 MG tablet Take 400 mg by mouth every 6 (six) hours as needed for headache.   02/16/2019 at 1400   CALCIUM PO Take 1 tablet by mouth 2 (two) times daily.   Not Taking at Unknown time   dicyclomine (BENTYL) 20 MG tablet Take 1 tablet (20 mg total) by mouth 3 (three) times  daily as needed for spasms (abdominal cramping). (Patient not taking: Reported on 02/16/2019) 20 tablet 0 Not Taking at Unknown time   esomeprazole (NEXIUM) 40 MG capsule Take 1 capsule (40 mg total) by mouth daily. (Patient not taking: Reported on 02/16/2019) 30 capsule 0 Not Taking at Unknown time   hydrochlorothiazide (HYDRODIURIL) 25 MG tablet Take 25 mg by mouth daily.      loperamide (LOPERAMIDE A-D) 2 MG tablet Take 2 mg by mouth 2 (two) times daily as needed for diarrhea or loose stools.   prn at prn   meclizine (ANTIVERT) 25 MG tablet  Take 1 tablet (25 mg total) by mouth 3 (three) times daily as needed for dizziness. (Patient not taking: Reported on 02/16/2019) 15 tablet 0 Completed Course at Unknown time   METFORMIN HCL PO Take 2 tablets by mouth 2 (two) times daily.   Not Taking at Unknown time   ondansetron (ZOFRAN ODT) 4 MG disintegrating tablet Take 1 tablet (4 mg total) by mouth every 8 (eight) hours as needed for nausea or vomiting. (Patient not taking: Reported on 02/16/2019) 20 tablet 0 Completed Course at Unknown time   POTASSIUM PO Take 1 tablet by mouth daily.   Not Taking at Unknown time   PRESCRIPTION MEDICATION Take 1 tablet by mouth 2 (two) times daily.   Not Taking at Unknown time   Scheduled:  insulin aspart  0-15 Units Subcutaneous TID WC   insulin aspart  0-5 Units Subcutaneous QHS   [START ON 02/20/2019] pantoprazole  40 mg Intravenous Q12H   promethazine  25 mg Oral Once   Continuous:  sodium chloride     cefTRIAXone (ROCEPHIN)  IV 2 g (02/17/19 1700)   octreotide  (SANDOSTATIN)    IV infusion 50 mcg/hr (02/18/19 0846)   pantoprozole (PROTONIX) infusion 8 mg/hr (02/18/19 0847)   ZOX:[id number]:acetaminophen, dicyclomine, diphenhydrAMINE, hydrALAZINE, [DISCONTINUED] ondansetron **OR** ondansetron (ZOFRAN) IV, traMADol Anti-infectives (From admission, onward)   Start     Dose/Rate Route Frequency Ordered Stop   02/16/19 2200  cefTRIAXone (ROCEPHIN) 2 g in sodium chloride 0.9 % 100 mL IVPB     2 g 200 mL/hr over 30 Minutes Intravenous Every 24 hours 02/16/19 2156       Scheduled Meds:  insulin aspart  0-15 Units Subcutaneous TID WC   insulin aspart  0-5 Units Subcutaneous QHS   [START ON 02/20/2019] pantoprazole  40 mg Intravenous Q12H   promethazine  25 mg Oral Once   Continuous Infusions:  sodium chloride     cefTRIAXone (ROCEPHIN)  IV 2 g (02/17/19 1700)   octreotide  (SANDOSTATIN)    IV infusion 50 mcg/hr (02/18/19 0846)   pantoprozole (PROTONIX) infusion 8 mg/hr (02/18/19 0847)    PRN Meds:.acetaminophen, dicyclomine, diphenhydrAMINE, hydrALAZINE, [DISCONTINUED] ondansetron **OR** ondansetron (ZOFRAN) IV, traMADol   Assessment: Active Problems:   GI bleed  Decompensated cirrhosis of liver from long-term NSAID injury, presented with upper GI bleed secondary to bleeding esophageal varices.  Patient had 2 dark bloody bowel movements this morning.  She reports that the stools are darker compared to yesterday  Plan: Hematemesis Status post EGD 8/15, large esophageal varices with stigmata of recent bleeding status post variceal ligation x4, incompletely eradicated Stat CBC, likely passing old blood Continue pantoprazole and octreotide drips Continue ceftriaxone for SBP prophylaxis Recommend ultrasound for paracentesis She will need long-term nonselective beta-blockers as secondary variceal prophylaxis upon discharge Antiemetics to control nausea Ultrasound Doppler did not reveal any portal vein thrombosis Recommend 2D echocardiogram in preparation  for urgent TIPS in case patient has recurrence of variceal bleed and she will need [facility] be [facility] for TIPS placement.  Patient will be a good candidate as her meld-Na is less than 20 pending echocardiogram results to rule out portal hypertension and right heart failure.   Decompensated cirrhosis Currently volume overloaded, Low-dose diuretics can be initiated once patient is more stable from bleeding stand point, low sodium diet CT abdomen and pelvis did not reveal liver lesions in 09/2018 No evidence of PSE HRS none Avoid  NSAIDs Close [facility] Repeat EGD in 4-6weeks for variceal ligation    LOS: 2 days   Amrutha Avera 02/18/2019, 12:11 PM

## 2019-02-18 NOTE — Progress Notes (Signed)
Notified Dr. Marius Ditch from [place] of bloody stools. See new orders for Stat CBC. Will continue to monitor.

## 2019-02-18 NOTE — Progress Notes (Signed)
Re-administered  tramadol PRN , per Dr. Leonidas Romberg. PT vomiting vomited shortly after first dose. Pt complaining of having pain. 5 on a scale of 0-10. Pain in Her head and back. Will continue to monitor.

## 2019-02-18 NOTE — Progress Notes (Signed)
Pt had dark bloody stools this morning x2. Pt is also vomiting. No blood present in vomit. Gave PRN Zofran.

## 2019-02-18 NOTE — Progress Notes (Signed)
Spoke to patient's son Clair Gulling, updated on mother's status via phone after receiving verbal authorization from patient. Wenda Low [facility]

## 2019-02-18 NOTE — Progress Notes (Signed)
[place] at [place] NAME: [name]    MR#:  [id number]  DATE OF BIRTH:  May 06, 1956  SUBJECTIVE:  CHIEF COMPLAINT:   Chief Complaint  Patient presents with  . Hematemesis   Patient reported to have had dark bloody stools this morning.  Gastroenterologist Dr. Marius Ditch notified.  Stat CBC requested.  Patient status post EGD on 02/17/2019 with banding of esophageal varice.  Still on octreotide and Protonix drip in the ICU.  REVIEW OF SYSTEMS:  Review of Systems  Constitutional: Negative for chills and fever.  HENT: Negative for hearing loss and tinnitus.   Eyes: Negative for blurred vision and double vision.  Respiratory: Negative for sputum production.   Cardiovascular: Negative for chest pain and palpitations.  Gastrointestinal: Negative for abdominal pain and constipation.       Admitted with vomiting of blood.  Genitourinary: Negative for dysuria and urgency.  Musculoskeletal: Negative for myalgias and neck pain.  Skin: Negative for itching and rash.  Neurological: Negative for dizziness and headaches.  Psychiatric/Behavioral: Negative for depression and hallucinations.    DRUG ALLERGIES:   Allergies  Allergen Reactions  . Hydrocodone-Acetaminophen Nausea And Vomiting  . Codeine Nausea And Vomiting  . Lactose Intolerance (Gi)    VITALS:  Blood pressure (!) 142/75, pulse 68, temperature 97.9 F (36.6 C), temperature source Oral, resp. rate 10, height 5\' 2"  (1.575 m), weight 78.9 kg, SpO2 93 %. PHYSICAL EXAMINATION:  Physical Exam  GENERAL:  63 y.o.-year-old patient lying in the bed with no acute distress.  EYES: Pupils equal, round, reactive to light and accommodation. No scleral icterus. Extraocular muscles intact.  HEENT: Head atraumatic, normocephalic. Oropharynx and nasopharynx clear.  NECK:  Supple, no jugular venous distention. No thyroid enlargement, no tenderness.  LUNGS: Normal breath sounds bilaterally, no wheezing,  rales,rhonchi or crepitation. No use of accessory muscles of respiration.  CARDIOVASCULAR: Regular rate and rhythm, S1, S2 normal. No murmurs, rubs, or gallops.  ABDOMEN: Soft, nondistended,  nontender, Bowel sounds present. No organomegaly or mass.  EXTREMITIES: No pedal edema, cyanosis, or clubbing.  NEUROLOGIC: Cranial nerves II through XII are intact. Muscle strength 5/5 in all extremities. Sensation intact. Gait not checked.  PSYCHIATRIC: The patient is alert and oriented x 3.  Normal affect and good eye contact. SKIN: No obvious rash, lesion, or ulcer.  LABORATORY PANEL:  Female CBC Recent Labs  Lab 02/18/19 0428  WBC 2.5*  HGB 7.9*  HCT 24.2*  PLT 63*   ------------------------------------------------------------------------------------------------------------------ Chemistries  Recent Labs  Lab 02/18/19 0428  NA 137  K 3.6  CL 110  CO2 21*  GLUCOSE 147*  BUN 14  CREATININE 0.80  CALCIUM 8.6*  MG 2.1  AST 40  ALT 23  ALKPHOS 66  BILITOT 1.4*   RADIOLOGY:  US Liver Doppler  Result Date: 02/17/2019 CLINICAL DATA:  63 year old with history of cirrhosis and esophageal varices. Recently presented with bloody emesis. EXAM: DUPLEX ULTRASOUND OF LIVER TECHNIQUE: Color and duplex Doppler ultrasound was performed to evaluate the hepatic in-flow and out-flow vessels. COMPARISON:  CT 09/18/2018 FINDINGS: Liver: Nodular hepatic contour is compatible with cirrhosis. Liver parenchyma is mildly heterogeneous. Gallbladder is mild-to-moderately distended with multiple echogenic stones. Trace perihepatic ascites. No focal lesion, mass or intrahepatic biliary ductal dilatation. Main Portal Vein size: 1.3 cm Portal Vein Velocities Main Prox:  31 cm/sec Main Mid: 25 cm/sec Main Dist:  26 cm/sec Right: 11 cm/sec Left: 16 cm/sec Hepatic Vein Velocities Right:  30  cm/sec Middle:  27 cm/sec Left:  18 cm/sec IVC: Present and patent with normal respiratory phasicity. Hepatic Artery Velocity:  71  cm/sec Splenic Vein Velocity:  25 cm/sec Spleen: 18 cm x 9 cm x 11 cm with a total volume of 912 cm^3 (411 cm^3 is upper limit normal) Portal Vein Occlusion/Thrombus: No Splenic Vein Occlusion/Thrombus: No Ascites: Present Varices: None Normal hepatopetal flow in the portal venous system. Normal hepatofugal flow in the hepatic veins. Perisplenic ascites. IMPRESSION: 1. Cirrhosis with portal hypertension demonstrated by splenomegaly and ascites. 2. Portal venous system is patent with normal direction of flow. 3. Cholelithiasis. Electronically Signed   By: Richarda OverlieAdam  Henn M.D.   On: 02/17/2019 14:03   ASSESSMENT AND PLAN:   1.  GI bleed -  Patient presented with hematemesis.  Admitted to the ICU and started on octreotide and Protonix infusions.   Patient status post EGD done on 02/17/2019 which revealed portal hypertensive gastropathy.  Recently bleeding large (> 5 mm) esophageal varices. Incompletely eradicated. Banded. No specimens collected. Nursing staff reported patient still having dark bloody stools this morning.  No vomiting of blood. Liver ultrasound done reviewed cirrhosis with portal hypertension demonstrated by splenomegaly and ascites.. Portal venous system is patent with normal direction of flow. Cholelithiasis. Ultrasound-guided paracentesis and echocardiogram ordered this morning by gastroenterologist.  2.  Hypokalemia Replaced  3.  Diabetes mellitus - Moderate sliding scale insulin  4.  Hypertension Blood pressure fairly controlled.  PRN IV hydralazine with parameters  DVT prophylaxis with SCDs.   All the records are reviewed and case discussed with Care Management/Social Worker. Management plans discussed with the patient, family and they are in agreement.  CODE STATUS: Full Code  TOTAL TIME TAKING CARE OF THIS PATIENT: 36 minutes.   More than 50% of the time was spent in counseling/coordination of care: YES  POSSIBLE D/C IN 2 DAYS, DEPENDING ON CLINICAL CONDITION.    Jennfier Abdulla M.D on 02/18/2019 at 11:56 AM  Between 7am to 6pm - Pager - [phone]  After 6pm go to [email or web address] - Social research officer, governmentpassword [email or web address]  [facility] [place] [facility]  Office  [phone]  CC: Primary care physician; Patient, No Pcp Per  Note: This dictation was prepared with Dragon dictation along with smaller phrase technology. Any transcriptional errors that result from this process are unintentional.

## 2019-02-18 NOTE — Progress Notes (Signed)
No distress.  Chronic back pain.  Develops nausea with opiates  Vitals:   02/18/19 0800 02/18/19 0900 02/18/19 1000 02/18/19 1100  BP:  (!) 142/75    Pulse: 71 68    Resp: 15 13 (!) 21 10  Temp:      TempSrc:      SpO2: 97% 93%    Weight:      Height:      RA  Gen: NAD HEENT: NCAT, sclerae white Neck: No JVD Lungs: breath sounds full, no wheezes or other adventitious sounds Cardiovascular: RRR, no murmurs Abdomen: Soft, nontender, normal BS Ext: without clubbing, cyanosis, edema Neuro: grossly intact Skin: Limited exam, no lesions noted  BMP Latest Ref Rng & Units 02/18/2019 02/17/2019 02/16/2019  Glucose 70 - 99 mg/dL 147(H) 154(H) 159(H)  BUN 8 - 23 mg/dL 14 19 21   Creatinine 0.44 - 1.00 mg/dL 0.80 0.72 0.95  Sodium 135 - 145 mmol/L 137 142 140  Potassium 3.5 - 5.1 mmol/L 3.6 4.0 3.2(L)  Chloride 98 - 111 mmol/L 110 116(H) 110  CO2 22 - 32 mmol/L 21(L) 21(L) 22  Calcium 8.9 - 10.3 mg/dL 8.6(L) 8.1(L) 8.5(L)   CBC Latest Ref Rng & Units 02/18/2019 02/18/2019 02/17/2019  WBC 4.0 - 10.5 K/uL 3.2(L) 2.5(L) -  Hemoglobin 12.0 - 15.0 g/dL 8.7(L) 7.9(L) 8.8(L)  Hematocrit 36.0 - 46.0 % 27.4(L) 24.2(L) 27.9(L)  Platelets 150 - 400 K/uL 66(L) 63(L) -   No new chest x-ray  IMPRESSION: UGIB Esophageal varices Portal gastropathy Cirrhosis Chronic back pain Opioid intolerance Acute blood loss anemia-no evidence of active bleeding presently Moderate thrombocytopenia  IMPRESSION: Cont SDU monitoring through today Continue octreotide and pantoprazole per GI Advance diet and activity as able Monitor CBC/plts  Merton Border, MD PCCM service Mobile [phone] Pager [phone] 02/18/2019 1:23 PM

## 2019-02-19 ENCOUNTER — Encounter: Payer: Self-pay | Admitting: Gastroenterology

## 2019-02-19 ENCOUNTER — Inpatient Hospital Stay: Payer: Medicare HMO

## 2019-02-19 ENCOUNTER — Inpatient Hospital Stay (HOSPITAL_COMMUNITY)
Admit: 2019-02-19 | Discharge: 2019-02-19 | Disposition: A | Discharge status: Home / Self Care | Payer: Medicare HMO | Attending: Gastroenterology | Admitting: Gastroenterology

## 2019-02-19 DIAGNOSIS — D509 Iron deficiency anemia, unspecified: Secondary | ICD-10-CM

## 2019-02-19 DIAGNOSIS — K921 Melena: Secondary | ICD-10-CM

## 2019-02-19 DIAGNOSIS — I340 Nonrheumatic mitral (valve) insufficiency: Secondary | ICD-10-CM

## 2019-02-19 DIAGNOSIS — I361 Nonrheumatic tricuspid (valve) insufficiency: Secondary | ICD-10-CM

## 2019-02-19 LAB — CBC
HCT: 26.2 % — ABNORMAL LOW (ref 36.0–46.0)
Hemoglobin: 8.6 g/dL — ABNORMAL LOW (ref 12.0–15.0)
MCH: 23.9 pg — ABNORMAL LOW (ref 26.0–34.0)
MCHC: 32.8 g/dL (ref 30.0–36.0)
MCV: 72.8 fL — ABNORMAL LOW (ref 80.0–100.0)
Platelets: 71 10*3/uL — ABNORMAL LOW (ref 150–400)
RBC: 3.6 MIL/uL — ABNORMAL LOW (ref 3.87–5.11)
RDW: 16.8 % — ABNORMAL HIGH (ref 11.5–15.5)
WBC: 4.4 10*3/uL (ref 4.0–10.5)
nRBC: 0.7 % — ABNORMAL HIGH (ref 0.0–0.2)

## 2019-02-19 LAB — ECHOCARDIOGRAM COMPLETE
Height: 63 in
Weight: 2654.4 oz

## 2019-02-19 LAB — GLUCOSE, CAPILLARY
Glucose-Capillary: 143 mg/dL — ABNORMAL HIGH (ref 70–99)
Glucose-Capillary: 140 mg/dL — ABNORMAL HIGH (ref 70–99)
Glucose-Capillary: 106 mg/dL — ABNORMAL HIGH (ref 70–99)
Glucose-Capillary: 116 mg/dL — ABNORMAL HIGH (ref 70–99)

## 2019-02-19 MED ORDER — SODIUM CHLORIDE 0.9% FLUSH: 10.0000 mL | Freq: Two times a day (BID) | INTRAVENOUS | Status: DC

## 2019-02-19 MED ORDER — SODIUM CHLORIDE 0.9% FLUSH: 3.0000 mL | INTRAVENOUS | Status: DC | PRN

## 2019-02-19 MED ORDER — SODIUM CHLORIDE 0.9% FLUSH: 10.0000 mL | INTRAVENOUS | Status: DC | PRN

## 2019-02-19 MED ORDER — SODIUM CHLORIDE 0.9 % IV SOLN
INTRAVENOUS | Status: DC | PRN
  Administered 2019-02-19: 500 mL via INTRAVENOUS

## 2019-02-19 MED ORDER — SODIUM CHLORIDE 0.9% FLUSH
3.0000 mL | Freq: Two times a day (BID) | INTRAVENOUS | Status: DC
  Administered 2019-02-19 – 2019-02-21 (×5): 3 mL via INTRAVENOUS

## 2019-02-19 NOTE — Progress Notes (Signed)
Interventional Radiology Progress Note  [place] only shows a small amount of ascites in the peritoneal cavity. Not enough fluid for paracentesis procedure today.  [name]. Kathlene Cote, M.D Pager:  [phone]

## 2019-02-19 NOTE — Progress Notes (Signed)
[facility] [place] at [facility]   PATIENT NAME: Kirsten RutsMabel Ware    MR#:  [id number]  DATE OF BIRTH:  12/14/1955  SUBJECTIVE:  CHIEF COMPLAINT:   Chief Complaint  Patient presents with  . Hematemesis   No further bleeding or dark stools since yesterday.  No fevers.  Patient transferred out of ICU yesterday.    REVIEW OF SYSTEMS:  Review of Systems  Constitutional: Negative for chills and fever.  HENT: Negative for hearing loss and tinnitus.   Eyes: Negative for blurred vision and double vision.  Respiratory: Negative for sputum production.   Cardiovascular: Negative for chest pain and palpitations.  Gastrointestinal: Negative for abdominal pain and constipation.       Admitted with vomiting of blood.  Genitourinary: Negative for dysuria and urgency.  Musculoskeletal: Negative for myalgias and neck pain.  Skin: Negative for itching and rash.  Neurological: Negative for dizziness and headaches.  Psychiatric/Behavioral: Negative for depression and hallucinations.    DRUG ALLERGIES:   Allergies  Allergen Reactions  . Hydrocodone-Acetaminophen Nausea And Vomiting  . Codeine Nausea And Vomiting  . Lactose Intolerance (Gi)    VITALS:  Blood pressure 134/69, pulse 64, temperature 97.7 F (36.5 C), temperature source Oral, resp. rate 18, height 5\' 3"  (1.6 m), weight 75.3 kg, SpO2 98 %. PHYSICAL EXAMINATION:  Physical Exam  GENERAL:  63 y.o.-year-old patient lying in the bed with no acute distress.  EYES: Pupils equal, round, reactive to light and accommodation. No scleral icterus. Extraocular muscles intact.  HEENT: Head atraumatic, normocephalic. Oropharynx and nasopharynx clear.  NECK:  Supple, no jugular venous distention. No thyroid enlargement, no tenderness.  LUNGS: Normal breath sounds bilaterally, no wheezing, rales,rhonchi or crepitation. No use of accessory muscles of respiration.  CARDIOVASCULAR: Regular rate and rhythm, S1, S2 normal. No murmurs,  rubs, or gallops.  ABDOMEN: Soft, nondistended,  nontender, Bowel sounds present. No organomegaly or mass.  EXTREMITIES: No pedal edema, cyanosis, or clubbing.  NEUROLOGIC: Cranial nerves II through XII are intact. Muscle strength 5/5 in all extremities. Sensation intact. Gait not checked.  PSYCHIATRIC: The patient is alert and oriented x 3.  Normal affect and good eye contact. SKIN: No obvious rash, lesion, or ulcer.  LABORATORY PANEL:  Female CBC Recent Labs  Lab 02/19/19 0447  WBC 4.4  HGB 8.6*  HCT 26.2*  PLT 71*   ------------------------------------------------------------------------------------------------------------------ Chemistries  Recent Labs  Lab 02/18/19 0428  NA 137  K 3.6  CL 110  CO2 21*  GLUCOSE 147*  BUN 14  CREATININE 0.80  CALCIUM 8.6*  MG 2.1  AST 40  ALT 23  ALKPHOS 66  BILITOT 1.4*   RADIOLOGY:  [place] Abdomen Limited  Addendum Date: 02/19/2019   ADDENDUM REPORT: 02/19/2019 12:03 ADDENDUM: Due to small amount of ascites present, paracentesis procedure was not performed today. Electronically Signed   By: Irish LackGlenn  Yamagata M.D.   On: 02/19/2019 12:03   Result Date: 02/19/2019 CLINICAL DATA:  Cirrhosis of liver. EXAM: LIMITED ABDOMEN ULTRASOUND FOR ASCITES TECHNIQUE: Limited ultrasound survey for ascites was performed in all four abdominal quadrants. COMPARISON:  None. FINDINGS: Small amount of ascites within the RIGHT upper quadrant, LEFT upper quadrant, RIGHT lower quadrant and LEFT lower quadrant. IMPRESSION: Small amount of ascites within each quadrant. Electronically Signed: By: Bary RichardStan  Maynard M.D. On: 02/19/2019 10:35   ASSESSMENT AND PLAN:   1.  GI bleed -  Patient presented with hematemesis.  Admitted to the ICU and started on octreotide and  Protonix infusions.   Patient status post EGD done on 02/17/2019 which revealed portal hypertensive gastropathy.  Recently bleeding large (> 5 mm) esophageal varices. Incompletely eradicated. Banded. No  specimens collected. No further bleeding since yesterday.  Hemoglobin stable at 8.6. Liver ultrasound done reviewed cirrhosis with portal hypertension demonstrated by splenomegaly and ascites.. Portal venous system is patent with normal direction of flow. Cholelithiasis. 2D echocardiogram done with ejection fraction of 55 to 60%.  Right ventricular systolic pressure is normal with estimated pressure of 33.2 mmHg Ultrasound-guided paracentesis was attempted today but ultrasound only showed small amount of ascites and not enough for paracentesis today Reevaluated by gastroenterologist today.  To complete 72 hours of octreotide drip and then stop.  Also continue PPI orally after that. To initiate propranolol on nadolol as prophylaxis for secondary prevention of variceal bleed prior to discharge and continue on discharge.  Complete 5 days of antibiotics for SBP prophylaxis. Repeat ultrasound in 2 to 3 weeks for repeat banding.  2.  Hypokalemia Replaced  3.  Diabetes mellitus - Moderate sliding scale insulin  4.  Hypertension Blood pressure fairly controlled.  PRN IV hydralazine with parameters  DVT prophylaxis with SCDs.   All the records are reviewed and case discussed with Care Management/Social Worker. Management plans discussed with the patient, family and they are in agreement.  CODE STATUS: Full Code  TOTAL TIME TAKING CARE OF THIS PATIENT: 34 minutes.   More than 50% of the time was spent in counseling/coordination of care: YES  POSSIBLE D/C IN 2 DAYS, DEPENDING ON CLINICAL CONDITION.   Kile Kabler M.D on 02/19/2019 at 2:10 PM  Between 7am to 6pm - Pager - [phone]  After 6pm go to [email or web address] - Proofreader  [facility] [place] [facility]  Office  [phone]  CC: Primary care physician; Patient, No Pcp Per  Note: This dictation was prepared with Dragon dictation along with smaller phrase technology. Any transcriptional errors that result from this  process are unintentional.

## 2019-02-19 NOTE — Progress Notes (Signed)
Wyline MoodKiran Selicia Windom , MD [address], [address], [place], [place], [address] [address], [place], [place], [address] Phone: [phone]  Fax: [phone]   Marquis LunchMabel G Hallquist is being followed for varicel bleed  Day 1 of follow up   Subjective: No bowel movements, feels well    Objective: Vital signs in last 24 hours: Vitals:   02/18/19 1504 02/18/19 2009 02/19/19 0449 02/19/19 0752  BP: (!) 163/91 (!) 146/74 139/78 134/69  Pulse: 62 64 67 64  Resp:  20 18 18   Temp: 98.2 F (36.8 C) 98.4 F (36.9 C) 98.3 F (36.8 C) 97.7 F (36.5 C)  TempSrc: Oral Oral Oral Oral  SpO2:  100% 100% 98%  Weight: 75.9 kg  75.3 kg   Height: 5\' 3"  (1.6 m)      Weight change:   Intake/Output Summary (Last 24 hours) at 02/19/2019 0857 Last data filed at 02/18/2019 2200 Gross per 24 hour  Intake 360 ml  Output 200 ml  Net 160 ml     Exam: Heart:: Regular rate and rhythm, S1S2 present or without murmur or extra heart sounds Lungs: normal, clear to auscultation and clear to auscultation and percussion Abdomen: soft, nontender, normal bowel sounds   Lab Results: @LABTEST2 @ Micro Results: Recent Results (from the past 240 hour(s))  SARS Coronavirus 2 [facility](Hospital order, Performed in [facility] hospital lab) Nasopharyngeal Nasopharyngeal Swab     Status: None   Collection Time: 02/16/19 10:35 PM   Specimen: Nasopharyngeal Swab  Result Value Ref Range Status   SARS Coronavirus 2 NEGATIVE NEGATIVE Final    Comment: (NOTE) If result is NEGATIVE SARS-CoV-2 target nucleic acids are NOT DETECTED. The SARS-CoV-2 RNA is generally detectable in upper and lower  respiratory specimens during the acute phase of infection. The lowest  concentration of SARS-CoV-2 viral copies this assay can detect is 250  copies / mL. A negative result does not preclude SARS-CoV-2 infection  and should not be used as the sole basis for treatment or other  patient management decisions.  A negative result may occur  with  improper specimen collection / handling, submission of specimen other  than nasopharyngeal swab, presence of viral mutation(s) within the  areas targeted by this assay, and inadequate number of viral copies  (<250 copies / mL). A negative result must be combined with clinical  observations, patient history, and epidemiological information. If result is POSITIVE SARS-CoV-2 target nucleic acids are DETECTED. The SARS-CoV-2 RNA is generally detectable in upper and lower  respiratory specimens dur ing the acute phase of infection.  Positive  results are indicative of active infection with SARS-CoV-2.  Clinical  correlation with patient history and other diagnostic information is  necessary to determine patient infection status.  Positive results do  not rule out bacterial infection or co-infection with other viruses. If result is PRESUMPTIVE POSTIVE SARS-CoV-2 nucleic acids MAY BE PRESENT.   A presumptive positive result was obtained on the submitted specimen  and confirmed on repeat testing.  While 2019 novel coronavirus  (SARS-CoV-2) nucleic acids may be present in the submitted sample  additional confirmatory testing may be necessary for epidemiological  and / or clinical management purposes  to differentiate between  SARS-CoV-2 and other Sarbecovirus currently known to infect humans.  If clinically indicated additional testing with an alternate test  methodology [phone]([id number]) is advised. The SARS-CoV-2 RNA is generally  detectable in upper and lower respiratory sp ecimens during the acute  phase of infection. The expected  result is Negative. Fact Sheet for Patients:  StrictlyIdeas.no Fact Sheet for Healthcare Providers: BankingDealers.co.za This test is not yet approved or cleared by the [place] FDA and has been authorized for detection and/or diagnosis of SARS-CoV-2 by FDA under an Emergency Use Authorization (EUA).  This EUA  will remain in effect (meaning this test can be used) for the duration of the COVID-19 declaration under Section 564(b)(1) of the Act, 21 U.S.C. section 360bbb-3(b)(1), unless the authorization is terminated or revoked sooner. Performed at [facility], [address] [place], [place] [address]   MRSA PCR Screening     Status: None   Collection Time: 02/17/19  2:53 AM   Specimen: Nasopharyngeal  Result Value Ref Range Status   MRSA by PCR NEGATIVE NEGATIVE Final    Comment:        The GeneXpert MRSA Assay (FDA approved for NASAL specimens only), is one component of a comprehensive MRSA colonization surveillance program. It is not intended to diagnose MRSA infection nor to guide or monitor treatment for MRSA infections. Performed at [facility], [address] [place], [place] [address]    Studies/Results: US Liver Doppler  Result Date: 02/17/2019 CLINICAL DATA:  63 year old with history of cirrhosis and esophageal varices. Recently presented with bloody emesis. EXAM: DUPLEX ULTRASOUND OF LIVER TECHNIQUE: Color and duplex Doppler ultrasound was performed to evaluate the hepatic in-flow and out-flow vessels. COMPARISON:  CT 09/18/2018 FINDINGS: Liver: Nodular hepatic contour is compatible with cirrhosis. Liver parenchyma is mildly heterogeneous. Gallbladder is mild-to-moderately distended with multiple echogenic stones. Trace perihepatic ascites. No focal lesion, mass or intrahepatic biliary ductal dilatation. Main Portal Vein size: 1.3 cm Portal Vein Velocities Main Prox:  31 cm/sec Main Mid: 25 cm/sec Main Dist:  26 cm/sec Right: 11 cm/sec Left: 16 cm/sec Hepatic Vein Velocities Right:  30 cm/sec Middle:  27 cm/sec Left:  18 cm/sec IVC: Present and patent with normal respiratory phasicity. Hepatic Artery Velocity:  71 cm/sec Splenic Vein Velocity:  25 cm/sec Spleen: 18 cm x 9 cm x 11 cm with a total volume of 912 cm^3 (411 cm^3 is upper limit normal) Portal Vein  Occlusion/Thrombus: No Splenic Vein Occlusion/Thrombus: No Ascites: Present Varices: None Normal hepatopetal flow in the portal venous system. Normal hepatofugal flow in the hepatic veins. Perisplenic ascites. IMPRESSION: 1. Cirrhosis with portal hypertension demonstrated by splenomegaly and ascites. 2. Portal venous system is patent with normal direction of flow. 3. Cholelithiasis. Electronically Signed   By: Markus Daft M.D.   On: 02/17/2019 14:03   Medications: I have reviewed the patient's current medications. Scheduled Meds: . insulin aspart  0-15 Units Subcutaneous TID WC  . insulin aspart  0-5 Units Subcutaneous QHS  . [START ON 02/20/2019] pantoprazole  40 mg Intravenous Q12H  . sodium chloride flush  3 mL Intravenous Q12H   Continuous Infusions: . cefTRIAXone (ROCEPHIN)  IV 2 g (02/18/19 1707)  . iron sucrose    . octreotide  (SANDOSTATIN)    IV infusion 50 mcg/hr (02/19/19 0932)  . pantoprozole (PROTONIX) infusion 8 mg/hr (02/19/19 0648)   PRN Meds:.acetaminophen, dicyclomine, diphenhydrAMINE, hydrALAZINE, [DISCONTINUED] ondansetron **OR** ondansetron (ZOFRAN) IV, sodium chloride flush, traMADol   Assessment: Active Problems:   GI bleed  [name] 63 y.o. female with a history of decompensated cirrhosis of the liver admitted with upper GI bleed secondary esophageal varices.  S/p EGD on 02/17/2019 and 4 bands placed.  Plan: 1.  Complete 72 hours of octreotide then stop.  Continue on  PPI orally after that. 2.  Low-salt diet 3.  Close to discharge commenced on propranolol or nadolol low-dose as prophylaxis for secondary prevention of variceal bleed.. 4.  Complete 5 days of antibiotics for SBP prophylaxis. 5.  Repeat EGD in 2 to 3 weeks for repeat banding. 6.  Ultrasound guided paracentesis with diagnostic testing to rule out SBP. 7.  I note that the CBC demonstrates a microcytic anemia.  Ferritin levels are low at 14.  Suggest a dose of IV iron before discharge and may need  further IV iron as an outpatient.    LOS: 3 days   Wyline MoodKiran Tatym Schermer, MD 02/19/2019, 8:57 AM

## 2019-02-19 NOTE — Care Management Important Message (Signed)
Important Message  Patient Details  Name: [name] MRN: [id number] Date of Birth: 06/25/1956   Medicare Important Message Given:  Yes     Dannette Barbara 02/19/2019, 12:17 PM

## 2019-02-19 NOTE — Progress Notes (Signed)
*  PRELIMINARY RESULTS* Echocardiogram 2D Echocardiogram has been performed.  [name] 02/19/2019, 8:40 AM

## 2019-02-20 LAB — CBC
HCT: 24.9 % — ABNORMAL LOW (ref 36.0–46.0)
Hemoglobin: 8.1 g/dL — ABNORMAL LOW (ref 12.0–15.0)
MCH: 24.1 pg — ABNORMAL LOW (ref 26.0–34.0)
MCHC: 32.5 g/dL (ref 30.0–36.0)
MCV: 74.1 fL — ABNORMAL LOW (ref 80.0–100.0)
Platelets: 69 10*3/uL — ABNORMAL LOW (ref 150–400)
RBC: 3.36 MIL/uL — ABNORMAL LOW (ref 3.87–5.11)
RDW: 16.9 % — ABNORMAL HIGH (ref 11.5–15.5)
WBC: 3.3 10*3/uL — ABNORMAL LOW (ref 4.0–10.5)
nRBC: 0 % (ref 0.0–0.2)

## 2019-02-20 LAB — BASIC METABOLIC PANEL
Anion gap: 6 (ref 5–15)
BUN: 11 mg/dL (ref 8–23)
CO2: 24 mmol/L (ref 22–32)
Calcium: 8.5 mg/dL — ABNORMAL LOW (ref 8.9–10.3)
Chloride: 107 mmol/L (ref 98–111)
Creatinine, Ser: 0.9 mg/dL (ref 0.44–1.00)
GFR calc Af Amer: >60 mL/min (ref >60)
GFR calc non Af Amer: >60 mL/min (ref >60)
Glucose, Bld: 123 mg/dL — ABNORMAL HIGH (ref 70–99)
Potassium: 3.1 mmol/L — ABNORMAL LOW (ref 3.5–5.1)
Sodium: 137 mmol/L (ref 135–145)

## 2019-02-20 LAB — GLUCOSE, CAPILLARY
Glucose-Capillary: 137 mg/dL — ABNORMAL HIGH (ref 70–99)
Glucose-Capillary: 128 mg/dL — ABNORMAL HIGH (ref 70–99)
Glucose-Capillary: 198 mg/dL — ABNORMAL HIGH (ref 70–99)
Glucose-Capillary: 104 mg/dL — ABNORMAL HIGH (ref 70–99)
Glucose-Capillary: 127 mg/dL — ABNORMAL HIGH (ref 70–99)

## 2019-02-20 LAB — MAGNESIUM: Magnesium: 1.8 mg/dL (ref 1.7–2.4)

## 2019-02-20 LAB — HEPATITIS PANEL, ACUTE
HCV Ab: <0.1 s/co ratio (ref 0.0–0.9)
Hep A IgM: NEGATIVE
Hep B C IgM: NEGATIVE
Hepatitis B Surface Ag: NEGATIVE

## 2019-02-20 MED ORDER — POTASSIUM CHLORIDE CRYS ER 20 MEQ PO TBCR
40.0000 meq | EXTENDED_RELEASE_TABLET | Freq: Once | ORAL | Status: AC
  Administered 2019-02-20: 40 meq via ORAL
  Filled 2019-02-20: qty 2

## 2019-02-20 MED ORDER — SODIUM CHLORIDE 0.9 % IV SOLN
100.0000 mg | INTRAVENOUS | Status: DC
  Administered 2019-02-20: 100 mg via INTRAVENOUS
  Filled 2019-02-20 (×2): qty 5

## 2019-02-20 NOTE — Progress Notes (Signed)
[name] , MD [address], [address], [place], [place], [id number] [address], [place], [place], [address] Phone: [phone]  Fax: [phone]   [name] is being followed for variceal bleed  Day 2 of follow up   Subjective: Feels much better, no bowel movements , wants to eat    Objective: Vital signs in last 24 hours: Vitals:   02/19/19 1601 02/19/19 2028 02/20/19 0423 02/20/19 0816  BP: 138/75 (!) 141/75 (!) 142/68 122/65  Pulse: 61 (!) 58 (!) 57 (!) 51  Resp: 19 20 20 16   Temp: 98.3 F (36.8 C) 99.4 F (37.4 C) 98.4 F (36.9 C) 98.9 F (37.2 C)  TempSrc: Oral Oral Oral Oral  SpO2: 100% 100% 98% 97%  Weight:   74.4 kg   Height:       Weight change: -1.532 kg  Intake/Output Summary (Last 24 hours) at 02/20/2019 1129 Last data filed at 02/20/2019 1039 Gross per 24 hour  Intake 240 ml  Output 2000 ml  Net -1760 ml     Exam: Heart:: Regular rate and rhythm, S1S2 present or without murmur or extra heart sounds Lungs: normal, clear to auscultation and clear to auscultation and percussion Abdomen: soft, nontender, normal bowel sounds   Lab Results: @LABTEST2 @ Micro Results: Recent Results (from the past 240 hour(s))  SARS Coronavirus 2 [facility](Hospital order, Performed in [facility]) Nasopharyngeal Nasopharyngeal Swab     Status: None   Collection Time: 02/16/19 10:35 PM   Specimen: Nasopharyngeal Swab  Result Value Ref Range Status   SARS Coronavirus 2 NEGATIVE NEGATIVE Final    Comment: (NOTE) If result is NEGATIVE SARS-CoV-2 target nucleic acids are NOT DETECTED. The SARS-CoV-2 RNA is generally detectable in upper and lower  respiratory specimens during the acute phase of infection. The lowest  concentration of SARS-CoV-2 viral copies this assay can detect is 250  copies / mL. A negative result does not preclude SARS-CoV-2 infection  and should not be used as the sole basis for treatment or other  patient management  decisions.  A negative result may occur with  improper specimen collection / handling, submission of specimen other  than nasopharyngeal swab, presence of viral mutation(s) within the  areas targeted by this assay, and inadequate number of viral copies  (<250 copies / mL). A negative result must be combined with clinical  observations, patient history, and epidemiological information. If result is POSITIVE SARS-CoV-2 target nucleic acids are DETECTED. The SARS-CoV-2 RNA is generally detectable in upper and lower  respiratory specimens dur ing the acute phase of infection.  Positive  results are indicative of active infection with SARS-CoV-2.  Clinical  correlation with patient history and other diagnostic information is  necessary to determine patient infection status.  Positive results do  not rule out bacterial infection or co-infection with other viruses. If result is PRESUMPTIVE POSTIVE SARS-CoV-2 nucleic acids MAY BE PRESENT.   A presumptive positive result was obtained on the submitted specimen  and confirmed on repeat testing.  While 2019 novel coronavirus  (SARS-CoV-2) nucleic acids may be present in the submitted sample  additional confirmatory testing may be necessary for epidemiological  and / or clinical management purposes  to differentiate between  SARS-CoV-2 and other Sarbecovirus currently known to infect humans.  If clinically indicated additional testing with an alternate test  methodology [phone]) is advised. The SARS-CoV-2 RNA is generally  detectable in upper and lower respiratory sp ecimens during the acute  phase of infection. The expected result is Negative. Fact Sheet for Patients:  BoilerBrush.com.cyhttps://www.fda.gov/media/136312/download Fact Sheet for Healthcare Providers: https://pope.com/https://www.fda.gov/media/136313/download This test is not yet approved or cleared by the [place] FDA and has been authorized for detection and/or diagnosis of SARS-CoV-2 by FDA under an  Emergency Use Authorization (EUA).  This EUA will remain in effect (meaning this test can be used) for the duration of the COVID-19 declaration under Section 564(b)(1) of the Act, 21 U.S.C. section 360bbb-3(b)(1), unless the authorization is terminated or revoked sooner. Performed at [facility], [address] [place], [place] [address]   MRSA PCR Screening     Status: None   Collection Time: 02/17/19  2:53 AM   Specimen: Nasopharyngeal  Result Value Ref Range Status   MRSA by PCR NEGATIVE NEGATIVE Final    Comment:        The GeneXpert MRSA Assay (FDA approved for NASAL specimens only), is one component of a comprehensive MRSA colonization surveillance program. It is not intended to diagnose MRSA infection nor to guide or monitor treatment for MRSA infections. Performed at [facility], [address] [place], [place] [address]    Studies/Results: Koreas Abdomen Limited  Addendum Date: 02/19/2019   ADDENDUM REPORT: 02/19/2019 12:03 ADDENDUM: Due to small amount of ascites present, paracentesis procedure was not performed today. Electronically Signed   By: Irish LackGlenn  Yamagata M.D.   On: 02/19/2019 12:03   Result Date: 02/19/2019 CLINICAL DATA:  Cirrhosis of liver. EXAM: LIMITED ABDOMEN ULTRASOUND FOR ASCITES TECHNIQUE: Limited ultrasound survey for ascites was performed in all four abdominal quadrants. COMPARISON:  None. FINDINGS: Small amount of ascites within the RIGHT upper quadrant, LEFT upper quadrant, RIGHT lower quadrant and LEFT lower quadrant. IMPRESSION: Small amount of ascites within each quadrant. Electronically Signed: By: Bary RichardStan  Maynard M.D. On: 02/19/2019 10:35   Medications: I have reviewed the patient's current medications. Scheduled Meds: . insulin aspart  0-15 Units Subcutaneous TID WC  . insulin aspart  0-5 Units Subcutaneous QHS  . pantoprazole  40 mg Intravenous Q12H  . sodium chloride flush  3 mL Intravenous Q12H   Continuous Infusions: .  sodium chloride 500 mL (02/19/19 1712)  . cefTRIAXone (ROCEPHIN)  IV 2 g (02/19/19 1714)  . octreotide  (SANDOSTATIN)    IV infusion 50 mcg/hr (02/20/19 0312)   PRN Meds:.sodium chloride, acetaminophen, dicyclomine, diphenhydrAMINE, hydrALAZINE, [DISCONTINUED] ondansetron **OR** ondansetron (ZOFRAN) IV, sodium chloride flush, traMADol   Assessment: Active Problems:   GI bleed  Khalessi Hettie HolsteinG Cammarano 63 y.o. female with a history of decompensated cirrhosis of the liver admitted with upper GI bleed secondary esophageal varices.  S/p EGD on 02/17/2019 and 4 bands placed.  Plan: 1.  Complete 72 hours of octreotide then stop.  Continue on PPI orally after that. 2.  Low-salt diet- advance diet  3.  Close to discharge commenced on propranolol or nadolol low-dose as prophylaxis for secondary prevention of variceal bleed.. 4.  Complete 5 days of antibiotics for SBP prophylaxis. 5.  Repeat EGD in 2 to 3 weeks for repeat banding. 6.  Ultrasound guided paracentesis with diagnostic testing to rule out SBP. 7. Inpatient IV iron  and may need further IV iron as an outpatient. 8. F/u with Dr Allegra LaiVanga as an outpatient .    I will sign off.  Please call me if any further GI concerns or questions.  We would like to thank you for the opportunity to participate in the care of TXU CorpMabel G Ware.  LOS: 4 days   Jonathon Bellows, MD 02/20/2019, 11:29 AM

## 2019-02-20 NOTE — Progress Notes (Signed)
[facility] [place] NAME: [name]    MR#:  [id number]  DATE OF BIRTH:  Oct 09, 1955  SUBJECTIVE:  CHIEF COMPLAINT:   Chief Complaint  Patient presents with  . Hematemesis   No further bleeding or dark stools since yesterday.  Feeling much better denies any nausea or vomiting either  no fevers.  Patient transferred out of ICU 02/18/2019  REVIEW OF SYSTEMS:  Review of Systems  Constitutional: Negative for chills and fever.  HENT: Negative for hearing loss and tinnitus.   Eyes: Negative for blurred vision and double vision.  Respiratory: Negative for sputum production.   Cardiovascular: Negative for chest pain and palpitations.  Gastrointestinal: Negative for abdominal pain and constipation.       Admitted with vomiting of blood.  Genitourinary: Negative for dysuria and urgency.  Musculoskeletal: Negative for myalgias and neck pain.  Skin: Negative for itching and rash.  Neurological: Negative for dizziness and headaches.  Psychiatric/Behavioral: Negative for depression and hallucinations.    DRUG ALLERGIES:   Allergies  Allergen Reactions  . Hydrocodone-Acetaminophen Nausea And Vomiting  . Codeine Nausea And Vomiting  . Lactose Intolerance (Gi)    VITALS:  Blood pressure 122/65, pulse (!) 51, temperature 98.9 F (37.2 C), temperature source Oral, resp. rate 16, height 5\' 3"  (1.6 m), weight 74.4 kg, SpO2 97 %. PHYSICAL EXAMINATION:  Physical Exam  GENERAL:  63 y.o.-year-old patient lying in the bed with no acute distress.  EYES: Pupils equal, round, reactive to light and accommodation. No scleral icterus. Extraocular muscles intact.  HEENT: Head atraumatic, normocephalic. Oropharynx and nasopharynx clear.  NECK:  Supple, no jugular venous distention. No thyroid enlargement, no tenderness.  LUNGS: Normal breath sounds bilaterally, no wheezing, rales,rhonchi or crepitation. No use of accessory muscles of respiration.   CARDIOVASCULAR: Regular rate and rhythm, S1, S2 normal. No murmurs, rubs, or gallops.  ABDOMEN: Soft, nondistended,  nontender, Bowel sounds present.  EXTREMITIES: No pedal edema, cyanosis, or clubbing.  NEUROLOGIC: Cranial nerves II through XII are intact. Muscle strength 5/5 in all extremities. Sensation intact. Gait not checked.  PSYCHIATRIC: The patient is alert and oriented x 3.  Normal affect and good eye contact. SKIN: No obvious rash, lesion, or ulcer.  LABORATORY PANEL:  Female CBC Recent Labs  Lab 02/20/19 0412  WBC 3.3*  HGB 8.1*  HCT 24.9*  PLT 69*   ------------------------------------------------------------------------------------------------------------------ Chemistries  Recent Labs  Lab 02/18/19 0428 02/20/19 0412  NA 137 137  K 3.6 3.1*  CL 110 107  CO2 21* 24  GLUCOSE 147* 123*  BUN 14 11  CREATININE 0.80 0.90  CALCIUM 8.6* 8.5*  MG 2.1 1.8  AST 40  --   ALT 23  --   ALKPHOS 66  --   BILITOT 1.4*  --    RADIOLOGY:  No results found. ASSESSMENT AND PLAN:   1.  GI bleed -  Patient presented with hematemesis.  Admitted to the ICU and started on octreotide and Protonix infusions.   Patient status post EGD done on 02/17/2019 which revealed portal hypertensive gastropathy.  Recently bleeding large (> 5 mm) esophageal varices. Incompletely eradicated. Banded. No specimens collected. No further bleeding since yesterday.  Hemoglobin stable at 8.6-8.1 Liver ultrasound done reviewed cirrhosis with portal hypertension demonstrated by splenomegaly and ascites.. Portal venous system is patent with normal direction of flow. Cholelithiasis. 2D echocardiogram done with ejection fraction of 55 to 60%.  Right ventricular systolic pressure is normal with  estimated pressure of 33.2 mmHg Ultrasound-guided paracentesis was attempted today but ultrasound only showed small amount of ascites and not enough for paracentesis today Reevaluated by gastroenterologist today.  To  complete 72 hours of octreotide drip and then stop.  Also continue PPI orally after that. To initiate propranolol on nadolol as prophylaxis for secondary prevention of variceal bleed prior to discharge and continue on discharge.  Completed 5 days of antibiotics for SBP prophylaxis 02/20/2019 Repeat ultrasound in 2 to 3 weeks for repeat banding. Advance diet to low-salt IV iron supplements and at the time of discharge patient needs outpatient IV iron supplementation.  Follow-up with Dr. Allegra LaiVanga  2.  Hypokalemia Replaced recheck in a.m.  3.  Diabetes mellitus - Moderate sliding scale insulin  4.  Hypertension Blood pressure fairly controlled.  PRN IV hydralazine with parameters  DVT prophylaxis with SCDs.   All the records are reviewed and case discussed with Care Management/Social Worker. Management plans discussed with the patient, family and they are in agreement.  CODE STATUS: Full Code  TOTAL TIME TAKING CARE OF THIS PATIENT: 36  minutes.   More than 50% of the time was spent in counseling/coordination of care: YES  POSSIBLE D/C IN 2 DAYS, DEPENDING ON CLINICAL CONDITION.   Ramonita LabAruna Ovid Witman M.D on 02/20/2019 at 3:45 PM  Between 7am to 6pm - Pager - [phone]   After 6pm go to [email or web address] - Social research officer, governmentpassword EPAS [facility]  [facility]  Office  [phone]  CC: Primary care physician; Patient, No Pcp Per  Note: This dictation was prepared with Dragon dictation along with smaller phrase technology. Any transcriptional errors that result from this process are unintentional.

## 2019-02-20 NOTE — Plan of Care (Signed)
  Problem: Pain Managment: Goal: General experience of comfort will improve Outcome: Progressing   Problem: Activity: Goal: Risk for activity intolerance will decrease Outcome: Progressing   Problem: Clinical Measurements: Goal: Cardiovascular complication will be avoided Outcome: Progressing   Problem: Health Behavior/Discharge Planning: Goal: Ability to manage health-related needs will improve Outcome: Progressing

## 2019-02-21 LAB — CBC
HCT: 26.1 % — ABNORMAL LOW (ref 36.0–46.0)
Hemoglobin: 8.6 g/dL — ABNORMAL LOW (ref 12.0–15.0)
MCH: 24.5 pg — ABNORMAL LOW (ref 26.0–34.0)
MCHC: 33 g/dL (ref 30.0–36.0)
MCV: 74.4 fL — ABNORMAL LOW (ref 80.0–100.0)
Platelets: 70 10*3/uL — ABNORMAL LOW (ref 150–400)
RBC: 3.51 MIL/uL — ABNORMAL LOW (ref 3.87–5.11)
RDW: 17.8 % — ABNORMAL HIGH (ref 11.5–15.5)
WBC: 3.4 10*3/uL — ABNORMAL LOW (ref 4.0–10.5)
nRBC: 0 % (ref 0.0–0.2)

## 2019-02-21 LAB — BASIC METABOLIC PANEL
Anion gap: 7 (ref 5–15)
BUN: 13 mg/dL (ref 8–23)
CO2: 23 mmol/L (ref 22–32)
Calcium: 8.5 mg/dL — ABNORMAL LOW (ref 8.9–10.3)
Chloride: 107 mmol/L (ref 98–111)
Creatinine, Ser: 0.89 mg/dL (ref 0.44–1.00)
GFR calc Af Amer: >60 mL/min (ref >60)
GFR calc non Af Amer: >60 mL/min (ref >60)
Glucose, Bld: 145 mg/dL — ABNORMAL HIGH (ref 70–99)
Potassium: 3.6 mmol/L (ref 3.5–5.1)
Sodium: 137 mmol/L (ref 135–145)

## 2019-02-21 LAB — GLUCOSE, CAPILLARY
Glucose-Capillary: 145 mg/dL — ABNORMAL HIGH (ref 70–99)
Glucose-Capillary: 153 mg/dL — ABNORMAL HIGH (ref 70–99)

## 2019-02-21 MED ORDER — TRAMADOL HCL 50 MG PO TABS: 50.0000 mg | ORAL_TABLET | Freq: Four times a day (QID) | ORAL | 0 refills | Status: DC | PRN

## 2019-02-21 MED ORDER — NADOLOL 20 MG PO TABS: 20.0000 mg | ORAL_TABLET | Freq: Every day | ORAL | 0 refills | Status: DC

## 2019-02-21 MED ORDER — ALUM & MAG HYDROXIDE-SIMETH 200-200-20 MG/5ML PO SUSP: 30.0000 mL | ORAL | 0 refills | Status: DC | PRN

## 2019-02-21 MED ORDER — DIPHENHYDRAMINE HCL 25 MG PO CAPS: 25.0000 mg | ORAL_CAPSULE | Freq: Four times a day (QID) | ORAL | 0 refills | Status: DC | PRN

## 2019-02-21 MED ORDER — ACETAMINOPHEN 325 MG PO TABS: 650.0000 mg | ORAL_TABLET | Freq: Four times a day (QID) | ORAL | Status: DC | PRN

## 2019-02-21 MED ORDER — PANTOPRAZOLE SODIUM 40 MG PO TBEC: 40.0000 mg | DELAYED_RELEASE_TABLET | Freq: Two times a day (BID) | ORAL | Status: DC

## 2019-02-21 MED ORDER — ALUM & MAG HYDROXIDE-SIMETH 200-200-20 MG/5ML PO SUSP
30.0000 mL | ORAL | Status: DC | PRN
  Administered 2019-02-21: 30 mL via ORAL
  Filled 2019-02-21: qty 30

## 2019-02-21 MED ORDER — ONDANSETRON 4 MG PO TBDP: 4.0000 mg | ORAL_TABLET | Freq: Three times a day (TID) | ORAL | 0 refills | Status: DC | PRN

## 2019-02-21 MED ORDER — ESOMEPRAZOLE MAGNESIUM 40 MG PO CPDR: 40.0000 mg | DELAYED_RELEASE_CAPSULE | Freq: Two times a day (BID) | ORAL | 0 refills | Status: DC

## 2019-02-21 MED ORDER — DICYCLOMINE HCL 20 MG PO TABS: 20.0000 mg | ORAL_TABLET | Freq: Three times a day (TID) | ORAL | 0 refills | Status: DC | PRN

## 2019-02-21 NOTE — Plan of Care (Signed)
Nutrition Education Note  RD consulted for nutrition education regarding low sodium diet education  RD provided "Low Sodium Nutrition Therapy" handout from the Academy of Nutrition and Dietetics. Reviewed patient's dietary recall. Provided examples on ways to decrease sodium intake in diet. Discouraged intake of processed foods and use of salt shaker. Encouraged fresh fruits and vegetables as well as whole grain sources of carbohydrates to maximize fiber intake.   RD discussed why it is important for patient to adhere to diet recommendations, and emphasized the role of fluids, foods to avoid, and importance of weighing self daily. Teach back method used.  Expect good compliance.  Body mass index is 29.07 kg/m. Pt meets criteria for overweight based on current BMI.  Current diet order is 2 gram, patient is consuming approximately 100% of meals at this time. Labs and medications reviewed. No further nutrition interventions warranted at this time. RD contact information provided. If additional nutrition issues arise, please re-consult RD.   Koleen Distance MS, RD, LDN Pager #- [phone] Office#- [phone] After Hours Pager: [phone]

## 2019-02-21 NOTE — Evaluation (Signed)
Physical Therapy Evaluation Patient Details Name: [name] MRN: [id number] DOB: 11/22/1955 Today's Date: 02/21/2019   History of Present Illness  Patient is 63 yo female that presented to ED due to hematemesis, chest pain, black stool, SOB, admitted to CCU 8/14, transferred to telemetry floor 8/16. Underwent EGD with banding of esophageal varices and revealed portal hypertensive gastropathy. PMH of DM, HTN, IBS    Clinical Impression  Pt alert, in bed, reported no pain at start of session. Stated she lives with her husband, independent in ADLs, mod I for IADLs, often ambulates without AD, but will use SPC as needed for R knee pain and utilizes scooter for grocery shopping.  Patient demonstrated bed mobility independently, good sitting and standing balance, transferred mod I and ambulated ~13270ft without AD supervision/mod I. Formal LE testing revealed mild strength deficits on RLE compared to LLE, though functional. Higher level balance assessed, no LOB or difficulty noted, mildly decreased gait velocity but overall WFLs. The patient demonstrated and reported return to baseline level of functioning, no further acute PT needs indicated. PT to sign off. Please reconsult PT if pt status changes or acute needs are identified.      Follow Up Recommendations No PT follow up    Equipment Recommendations  None recommended by PT    Recommendations for Other Services       Precautions / Restrictions Precautions Precautions: Fall Restrictions Weight Bearing Restrictions: No      Mobility  Bed Mobility Overal bed mobility: Independent                Transfers Overall transfer level: Modified independent                  Ambulation/Gait Ambulation/Gait assistance: Supervision;Modified independent (Device/Increase time) Gait Distance (Feet): 170 Feet Assistive device: None Gait Pattern/deviations: WFL(Within Functional Limits) Gait velocity: mildly decreased Gait  velocity interpretation: >2.62 ft/sec, indicative of community ambulatory    Stairs            Wheelchair Mobility    Modified Rankin (Stroke Patients Only)       Balance   Sitting-balance support: Feet supported Sitting balance-Leahy Scale: Good       Standing balance-Leahy Scale: Good               High level balance activites: Direction changes;Turns;Sudden stops High Level Balance Comments: no LOB noted with higher level balance assessment             Pertinent Vitals/Pain Pain Assessment: No/denies pain    Home Living Family/patient expects to be discharged to:: Private residence Living Arrangements: Spouse/significant other Available Help at Discharge: Family Type of Home: Mobile home Home Access: Stairs to enter Entrance Stairs-Rails: Left Entrance Stairs-Number of Steps: 6-8 Home Layout: One level Home Equipment: Walker - 2 wheels;Cane - single point      Prior Function Level of Independence: Independent with assistive device(s)         Comments: uses [employer] scooter for groceries, independent in ADLs, occasionally will use SPC if R knee is painful. No falls in the last 6 months     Hand Dominance   Dominant Hand: Right    Extremity/Trunk Assessment   Upper Extremity Assessment Upper Extremity Assessment: RUE deficits/detail;LUE deficits/detail RUE Deficits / Details: grossly 4+/5 LUE Deficits / Details: grossly 4+/5    Lower Extremity Assessment Lower Extremity Assessment: RLE deficits/detail;LLE deficits/detail RLE Deficits / Details: hip flexion/knee extension 4/5, 4+/5 remaining MMT LLE Deficits / Details:  4+/5       Communication   Communication: No difficulties  Cognition Arousal/Alertness: Awake/alert Behavior During Therapy: WFL for tasks assessed/performed Overall Cognitive Status: Within Functional Limits for tasks assessed                                        General Comments       Exercises     Assessment/Plan    PT Assessment Patent does not need any further PT services  PT Problem List         PT Treatment Interventions      PT Goals (Current goals can be found in the Care Plan section)       Frequency     Barriers to discharge        Co-evaluation               AM-PAC PT "6 Clicks" Mobility  Outcome Measure Help needed turning from your back to your side while in a flat bed without using bedrails?: None Help needed moving from lying on your back to sitting on the side of a flat bed without using bedrails?: None Help needed moving to and from a bed to a chair (including a wheelchair)?: None Help needed standing up from a chair using your arms (e.g., wheelchair or bedside chair)?: None Help needed to walk in hospital room?: None Help needed climbing 3-5 steps with a railing? : None 6 Click Score: 24    End of Session Equipment Utilized During Treatment: Gait belt Activity Tolerance: Patient tolerated treatment well Patient left: in chair;with chair alarm set;with call bell/phone within reach Nurse Communication: Mobility status PT Visit Diagnosis: Other abnormalities of gait and mobility (R26.89)    Time: 7939-0300 PT Time Calculation (min) (ACUTE ONLY): 19 min   Charges:   PT Evaluation $PT Eval Low Complexity: 1 Low         Lieutenant Diego PT, DPT 11:43 AM,02/21/19 [phone]

## 2019-02-21 NOTE — Plan of Care (Signed)
  Problem: Activity: Goal: Risk for activity intolerance will decrease Outcome: Progressing   Problem: Pain Managment: Goal: General experience of comfort will improve Outcome: Progressing   Problem: Safety: Goal: Ability to remain free from injury will improve Outcome: Progressing   

## 2019-02-21 NOTE — Progress Notes (Signed)
PHARMACIST - PHYSICIAN COMMUNICATION  DR:   Margaretmary Eddy  CONCERNING: IV to Oral Route Change Policy  RECOMMENDATION: This patient is receiving pantoprazole by the intravenous route.  Based on criteria approved by the Pharmacy and Therapeutics Committee, the intravenous medication(s) is/are being converted to the equivalent oral dose form(s).   DESCRIPTION: These criteria include:  The patient is eating (either orally or via tube) and/or has been taking other orally administered medications for a least 24 hours  The patient has no evidence of active gastrointestinal bleeding or impaired GI absorption (gastrectomy, short bowel, patient on TNA or NPO).  If you have questions about this conversion, please contact the Pharmacy Department  []   [phone] )  [name] [x]   [phone] )  [facility] []   [phone] )  [facility] []   [phone] )  [facility] []   [phone] )  [place], PharmD, BCPS Clinical Pharmacist 02/21/2019 10:02 AM

## 2019-02-21 NOTE — Plan of Care (Signed)
  Problem: Health Behavior/Discharge Planning: Goal: Ability to manage health-related needs will improve Outcome: Progressing   Problem: Clinical Measurements: Goal: Ability to maintain clinical measurements within normal limits will improve Outcome: Progressing Goal: Will remain free from infection Outcome: Progressing Goal: Diagnostic test results will improve Outcome: Progressing Goal: Respiratory complications will improve Outcome: Progressing Goal: Cardiovascular complication will be avoided Outcome: Progressing   Problem: Activity: Goal: Risk for activity intolerance will decrease Outcome: Progressing   Problem: Pain Managment: Goal: General experience of comfort will improve 02/21/2019 0426 by Loran Senters, RN Outcome: Progressing 02/21/2019 0418 by Loran Senters, RN Outcome: Progressing   Problem: Safety: Goal: Ability to remain free from injury will improve 02/21/2019 0426 by Loran Senters, RN Outcome: Progressing 02/21/2019 0418 by Loran Senters, RN Outcome: Progressing   Problem: Skin Integrity: Goal: Risk for impaired skin integrity will decrease Outcome: Progressing

## 2019-02-21 NOTE — Discharge Summary (Signed)
[facility] [place] NAME: [name]    MR#:  [id number]  DATE OF BIRTH:  18-Sep-1955  DATE OF ADMISSION:  02/16/2019 ADMITTING PHYSICIAN: Mayer Camel, NP  DATE OF DISCHARGE:  02/21/19   PRIMARY CARE PHYSICIAN: Patient, No Pcp Per    ADMISSION DIAGNOSIS:  GI bleed [K92.2]  DISCHARGE DIAGNOSIS:  Active Problems:   GI bleed  Portal hypertensive gastropathy SECONDARY DIAGNOSIS:   Past Medical History:  Diagnosis Date  . Diabetes mellitus without complication ([place])   . Hypertension   . Irritable bowel     HOSPITAL COURSE:   [name]  is a 63 y.o. female with a known history of diabetes mellitus, hypertension, and irritable bowel syndrome.  She presented to the emergency room reporting an episode of nausea and vomiting with bright red bloody emesis followed by 4 loose diarrhea stools.  The first 2 stools were dark black and tarry.  The third stool began being watery with thick dark blood which was followed by the fourth stool which was the same.  Dr. Marius Ditch was consulted by the ED physician and patient has been started on octreotide and Protonix infusions.  She denies a prior history of GI bleed.  However she had EGD and colonoscopy about 1 year ago at the [facility] hospital and recalls results of small polyps and "thin esophagus "and was told she was a high risk for bleeding as related to her esophagus.  She denies fevers or chills.  She denies abdominal pain, chest pain, shortness of breath, or prior history of GI bleed.  Patient does take Motrin 200 to 400 mg a day usually for headache.  On arrival, her hemoglobin is 9.6 with hematocrit 29.2 and platelet count 92,000.  INR is 1.2.  Potassium is 3.2.  She has been admitted by the hospitalist  with transfer of care to the ICU  1.GI bleed - Patient presented with hematemesis.  Admitted to the ICU and started on octreotide and Protonix infusions.   Patient status post EGD done on  02/17/2019 which revealed portal hypertensive gastropathy.  Recently bleeding large (> 5 mm) esophageal varices. Incompletely eradicated. Banded. No specimens collected. No further bleeding since yesterday.  Hemoglobin stable at 8.6-8.1-8.6 Liver ultrasound done reviewed cirrhosis with portal hypertension demonstrated by splenomegaly and ascites.. Portal venous system is patent with normal direction of flow. Cholelithiasis. 2D echocardiogram done with ejection fraction of 55 to 60%.  Right ventricular systolic pressure is normal with estimated pressure of 33.2 mmHg Ultrasound-guided paracentesis was attempted today but ultrasound only showed small amount of ascites and not enough for paracentesis today Reevaluated by gastroenterologist today.  Patient has completed  72 hours of octreotide drip and then stop.  Also continue PPI orally  To initiate on nadolol as prophylaxis for secondary prevention of variceal bleed prior to discharge and continue on discharge.  Completed 5 days of antibiotics for SBP prophylaxis 02/20/2019 Repeat ultrasound in 2 to 3 weeks for repeat banding. Advance diet to low-salt IV iron supplements and at the time of discharge patient needs outpatient IV iron supplementation.  Follow-up with Dr. Marius Ditch in  A week Outpatient follow-up with oncology Dr. Rogue Bussing for IV iron supplements  2. Hypokalemia Replaced recheck potassium at 3.6.  3. Diabetes mellitus -Moderate sliding scale insulin  4. Hypertension Blood pressure fairly controlled.  PRN IV hydralazine with parameters  DVT prophylaxis with SCDs.   Seen by physical therapy no PT needs identified  DISCHARGE CONDITIONS:   fair  CONSULTS OBTAINED:     PROCEDURES EGD   DRUG ALLERGIES:   Allergies  Allergen Reactions  . Hydrocodone-Acetaminophen Nausea And Vomiting  . Codeine Nausea And Vomiting  . Lactose Intolerance (Gi)     DISCHARGE MEDICATIONS:   Allergies as of 02/21/2019       Reactions   Hydrocodone-acetaminophen Nausea And Vomiting   Codeine Nausea And Vomiting   Lactose Intolerance (gi)       Medication List    STOP taking these medications   hydrochlorothiazide 25 MG tablet Commonly known as: HYDRODIURIL   ibuprofen 200 MG tablet Commonly known as: ADVIL   Loperamide A-D 2 MG tablet Generic drug: loperamide   meclizine 25 MG tablet Commonly known as: ANTIVERT     TAKE these medications   acetaminophen 325 MG tablet Commonly known as: TYLENOL Take 2 tablets (650 mg total) by mouth every 6 (six) hours as needed for mild pain, moderate pain, fever or headache.   alum & mag hydroxide-simeth 200-200-20 MG/5ML suspension Commonly known as: MAALOX/MYLANTA Take 30 mLs by mouth every 4 (four) hours as needed for indigestion or heartburn.   CALCIUM PO Take 1 tablet by mouth 2 (two) times daily.   dicyclomine 20 MG tablet Commonly known as: BENTYL Take 1 tablet (20 mg total) by mouth 3 (three) times daily as needed for spasms (abdominal cramping).   diphenhydrAMINE 25 mg capsule Commonly known as: BENADRYL Take 1 capsule (25 mg total) by mouth every 6 (six) hours as needed for itching.   esomeprazole 40 MG capsule Commonly known as: NEXIUM Take 1 capsule (40 mg total) by mouth 2 (two) times daily before a meal. What changed: when to take this   METFORMIN HCL PO Take 2 tablets by mouth 2 (two) times daily.   nadolol 20 MG tablet Commonly known as: Corgard Take 1 tablet (20 mg total) by mouth daily.   ondansetron 4 MG disintegrating tablet Commonly known as: Zofran ODT Take 1 tablet (4 mg total) by mouth every 8 (eight) hours as needed for nausea or vomiting.   POTASSIUM PO Take 1 tablet by mouth daily.   PRESCRIPTION MEDICATION Take 1 tablet by mouth 2 (two) times daily.   traMADol 50 MG tablet Commonly known as: ULTRAM Take 1 tablet (50 mg total) by mouth every 6 (six) hours as needed for moderate pain or severe pain.         DISCHARGE INSTRUCTIONS:   Follow-up with primary care physician in 2 to 3 days Follow-up with gastroenterology Dr. Allegra Lai in a week Follow-up with oncology Dr. Donneta Romberg in 3 to 5 days for IV iron infusions  DIET:  Diabetic diet  DISCHARGE CONDITION:  Fair  ACTIVITY:  Activity as tolerated  OXYGEN:  Home Oxygen: No.   Oxygen Delivery: room air  DISCHARGE LOCATION:  home   If you experience worsening of your admission symptoms, develop shortness of breath, life threatening emergency, suicidal or homicidal thoughts you must seek medical attention immediately by calling 911 or calling your MD immediately  if symptoms less severe.  You Must read complete instructions/literature along with all the possible adverse reactions/side effects for all the Medicines you take and that have been prescribed to you. Take any new Medicines after you have completely understood and accpet all the possible adverse reactions/side effects.   Please note  You were cared for by a hospitalist during your hospital stay. If you have any questions about your discharge medications or  the care you received while you were in the hospital after you are discharged, you can call the unit and asked to speak with the hospitalist on call if the hospitalist that took care of you is not available. Once you are discharged, your primary care physician will handle any further medical issues. Please note that NO REFILLS for any discharge medications will be authorized once you are discharged, as it is imperative that you return to your primary care physician (or establish a relationship with a primary care physician if you do not have one) for your aftercare needs so that they can reassess your need for medications and monitor your lab values.     Today  Chief Complaint  Patient presents with  . Hematemesis   Patient is doing fine feeling much better.  Denies any complaints.  Tolerating diet no nausea no vomiting  wants to go home.  Husband at bedside  ROS:  CONSTITUTIONAL: Denies fevers, chills. Denies any fatigue, weakness.  EYES: Denies blurry vision, double vision, eye pain. EARS, NOSE, THROAT: Denies tinnitus, ear pain, hearing loss. RESPIRATORY: Denies cough, wheeze, shortness of breath.  CARDIOVASCULAR: Denies chest pain, palpitations, edema.  GASTROINTESTINAL: Denies nausea, vomiting, diarrhea, abdominal pain. Denies bright red blood per rectum. GENITOURINARY: Denies dysuria, hematuria. ENDOCRINE: Denies nocturia or thyroid problems. HEMATOLOGIC AND LYMPHATIC: Denies easy bruising or bleeding. SKIN: Denies rash or lesion. MUSCULOSKELETAL: Denies pain in neck, back, shoulder, knees, hips or arthritic symptoms.  NEUROLOGIC: Denies paralysis, paresthesias.  PSYCHIATRIC: Denies anxiety or depressive symptoms.   VITAL SIGNS:  Blood pressure 132/67, pulse 62, temperature 98.3 F (36.8 C), temperature source Oral, resp. rate 18, height 5\' 3"  (1.6 m), weight 74.4 kg, SpO2 95 %.  I/O:    Intake/Output Summary (Last 24 hours) at 02/21/2019 1335 Last data filed at 02/21/2019 1013 Gross per 24 hour  Intake 350.43 ml  Output 1550 ml  Net -1199.57 ml    PHYSICAL EXAMINATION:  GENERAL:  63 y.o.-year-old patient lying in the bed with no acute distress.  EYES: Pupils equal, round, reactive to light and accommodation. No scleral icterus. Extraocular muscles intact.  HEENT: Head atraumatic, normocephalic. Oropharynx and nasopharynx clear.  NECK:  Supple, no jugular venous distention. No thyroid enlargement, no tenderness.  LUNGS: Normal breath sounds bilaterally, no wheezing, rales,rhonchi or crepitation. No use of accessory muscles of respiration.  CARDIOVASCULAR: S1, S2 normal. No murmurs, rubs, or gallops.  ABDOMEN: Soft, non-tender, non-distended. Bowel sounds present.  EXTREMITIES: No pedal edema, cyanosis, or clubbing.  NEUROLOGIC: Cranial nerves II through XII are intact. Muscle strength  5/5 in all extremities. Sensation intact. Gait not checked.  PSYCHIATRIC: The patient is alert and oriented x 3.  SKIN: No obvious rash, lesion, or ulcer.   DATA REVIEW:   CBC Recent Labs  Lab 02/21/19 0552  WBC 3.4*  HGB 8.6*  HCT 26.1*  PLT 70*    Chemistries  Recent Labs  Lab 02/18/19 0428 02/20/19 0412 02/21/19 0552  NA 137 137 137  K 3.6 3.1* 3.6  CL 110 107 107  CO2 21* 24 23  GLUCOSE 147* 123* 145*  BUN 14 11 13   CREATININE 0.80 0.90 0.89  CALCIUM 8.6* 8.5* 8.5*  MG 2.1 1.8  --   AST 40  --   --   ALT 23  --   --   ALKPHOS 66  --   --   BILITOT 1.4*  --   --     Cardiac Enzymes No results for  input(s): TROPONINI in the last 168 hours.  Microbiology Results  Results for orders placed or performed during the hospital encounter of 02/16/19  SARS Coronavirus 2 [facility](Hospital order, Performed in [facility] hospital lab) Nasopharyngeal Nasopharyngeal Swab     Status: None   Collection Time: 02/16/19 10:35 PM   Specimen: Nasopharyngeal Swab  Result Value Ref Range Status   SARS Coronavirus 2 NEGATIVE NEGATIVE Final    Comment: (NOTE) If result is NEGATIVE SARS-CoV-2 target nucleic acids are NOT DETECTED. The SARS-CoV-2 RNA is generally detectable in upper and lower  respiratory specimens during the acute phase of infection. The lowest  concentration of SARS-CoV-2 viral copies this assay can detect is 250  copies / mL. A negative result does not preclude SARS-CoV-2 infection  and should not be used as the sole basis for treatment or other  patient management decisions.  A negative result may occur with  improper specimen collection / handling, submission of specimen other  than nasopharyngeal swab, presence of viral mutation(s) within the  areas targeted by this assay, and inadequate number of viral copies  (<250 copies / mL). A negative result must be combined with clinical  observations, patient history, and epidemiological information. If result is  POSITIVE SARS-CoV-2 target nucleic acids are DETECTED. The SARS-CoV-2 RNA is generally detectable in upper and lower  respiratory specimens dur ing the acute phase of infection.  Positive  results are indicative of active infection with SARS-CoV-2.  Clinical  correlation with patient history and other diagnostic information is  necessary to determine patient infection status.  Positive results do  not rule out bacterial infection or co-infection with other viruses. If result is PRESUMPTIVE POSTIVE SARS-CoV-2 nucleic acids MAY BE PRESENT.   A presumptive positive result was obtained on the submitted specimen  and confirmed on repeat testing.  While 2019 novel coronavirus  (SARS-CoV-2) nucleic acids may be present in the submitted sample  additional confirmatory testing may be necessary for epidemiological  and / or clinical management purposes  to differentiate between  SARS-CoV-2 and other Sarbecovirus currently known to infect humans.  If clinically indicated additional testing with an alternate test  methodology [id number]) is advised. The SARS-CoV-2 RNA is generally  detectable in upper and lower respiratory sp ecimens during the acute  phase of infection. The expected result is Negative. Fact Sheet for Patients:  BoilerBrush.com.cyhttps://www.fda.gov/media/136312/download Fact Sheet for Healthcare Providers: https://pope.com/https://www.fda.gov/media/136313/download This test is not yet approved or cleared by the [place] FDA and has been authorized for detection and/or diagnosis of SARS-CoV-2 by FDA under an Emergency Use Authorization (EUA).  This EUA will remain in effect (meaning this test can be used) for the duration of the COVID-19 declaration under Section 564(b)(1) of the Act, 21 U.S.C. section 360bbb-3(b)(1), unless the authorization is terminated or revoked sooner. Performed at [facility], [address] [place], [place] [address]   MRSA PCR Screening     Status: None   Collection  Time: 02/17/19  2:53 AM   Specimen: Nasopharyngeal  Result Value Ref Range Status   MRSA by PCR NEGATIVE NEGATIVE Final    Comment:        The GeneXpert MRSA Assay (FDA approved for NASAL specimens only), is one component of a comprehensive MRSA colonization surveillance program. It is not intended to diagnose MRSA infection nor to guide or monitor treatment for MRSA infections. Performed at [facility], [address] [place], [place] [address]     RADIOLOGY:  [place] Abdomen Limited  Addendum Date: 02/19/2019   ADDENDUM REPORT: 02/19/2019 12:03 ADDENDUM: Due to small amount of ascites present, paracentesis procedure was not performed today. Electronically Signed   By: Irish LackGlenn  Yamagata M.D.   On: 02/19/2019 12:03   Result Date: 02/19/2019 CLINICAL DATA:  Cirrhosis of liver. EXAM: LIMITED ABDOMEN ULTRASOUND FOR ASCITES TECHNIQUE: Limited ultrasound survey for ascites was performed in all four abdominal quadrants. COMPARISON:  None. FINDINGS: Small amount of ascites within the RIGHT upper quadrant, LEFT upper quadrant, RIGHT lower quadrant and LEFT lower quadrant. IMPRESSION: Small amount of ascites within each quadrant. Electronically Signed: By: Bary RichardStan  Maynard M.D. On: 02/19/2019 10:35    EKG:   Orders placed or performed during the hospital encounter of 02/16/19  . EKG 12-Lead  . EKG 12-Lead      Management plans discussed with the patient, husband at bedside and they are in agreement.  CODE STATUS:     Code Status Orders  (From admission, onward)         Start     Ordered   02/17/19 0245  Full code  Continuous     02/17/19 0245        Code Status History    This patient has a current code status but no historical code status.   Advance Care Planning Activity      TOTAL TIME TAKING CARE OF THIS PATIENT: 45 minutes.   Note: This dictation was prepared with Dragon dictation along with smaller phrase technology. Any transcriptional errors that result  from this process are unintentional.   @[facility] @  on 02/21/2019 at 1:35 PM  Between 7am to 6pm - Pager - [phone]  After 6pm go to [email or web address] - password [email or web address]  Fabio Ne[facility]ors[facility]  Office  [phone]  CC: Primary care physician; Patient, No Pcp Per

## 2019-02-21 NOTE — Discharge Instructions (Signed)
Follow-up with primary care physician in 2 to 3 days Follow-up with gastroenterology Dr. Marius Ditch in a week Follow-up with oncology Dr. Rogue Bussing in 3 to 5 days for IV iron infusions

## 2019-02-21 NOTE — Progress Notes (Signed)
Discharge instructions explained to pt/ verbalized an understanding/ iv and tele removed/ RX given to pt/ transported off unit via wheelchair.  

## 2019-02-28 ENCOUNTER — Encounter: Payer: Self-pay | Admitting: Gastroenterology

## 2019-02-28 ENCOUNTER — Ambulatory Visit (INDEPENDENT_AMBULATORY_CARE_PROVIDER_SITE_OTHER): Payer: No Typology Code available for payment source | Admitting: Gastroenterology

## 2019-02-28 ENCOUNTER — Telehealth: Payer: Self-pay

## 2019-02-28 ENCOUNTER — Other Ambulatory Visit: Payer: Self-pay

## 2019-02-28 VITALS — BP 156/94 | HR 74 | Temp 98.9°F | Ht 63.0 in | Wt 165.0 lb

## 2019-02-28 DIAGNOSIS — K7460 Unspecified cirrhosis of liver: Secondary | ICD-10-CM

## 2019-02-28 DIAGNOSIS — K7290 Hepatic failure, unspecified without coma: Secondary | ICD-10-CM

## 2019-02-28 DIAGNOSIS — I8510 Secondary esophageal varices without bleeding: Secondary | ICD-10-CM | POA: Diagnosis not present

## 2019-02-28 NOTE — Telephone Encounter (Signed)
LVM for pt to call office to schedule her EGD in 3 weeks with Dr. Marius Ditch.  This was discussed during her todays office visit and I informed her I would call her to schedule.  Scheduling Note:  EGD needed 3 weeks Dx: Decompensated Hepatic Cirrhosis  Thanks,  Sharyn Lull

## 2019-02-28 NOTE — Progress Notes (Signed)
Arlyss Repressohini R Vanga, MD [address]  [address]  [place], [place] [address]  Main: [phone]  Fax: [phone]    Gastroenterology Consultation  Referring Provider:     No ref. provider found [facility] Physician:  [facility]: [facility] Reason for Consultation:     Decompensated cirrhosis, hospital follow-up        HPI:   [name] is a 63 y.o. female referred by Dr. Eli Phillipsenter, [facility]  for consultation & management of decompensated cirrhosis, hospital follow-up.  Patient admitted to [facility] about 2 weeks ago secondary to massive hematemesis, underwent EGD which revealed large esophageal varices with stigmata and underwent band ligationx4.  She also has severe iron deficiency anemia, received IV iron.  She was discharged on nadolol and Bentyl.  She is known to have cirrhosis for last 4 years, [facility] and she was told that her cirrhosis is secondary to long-term NSAID use as reported by the patient.   Interval summary Since discharge from hospital, patient reports that she has been doing well.  Her bowel movements are currently brown in color.  Her energy levels are significantly improved but feels tired easily.  She denies nausea.  She does report discomfort in her chest whenever she tries to swallow that radiates to her mid thoracic area.  She is currently taking Nexium twice daily.  She denies swelling of legs, abdominal distention.  She denies insomnia, dark urine, yellowing of skin or eyes, itching, confusion.  She is maintaining low-sodium diet.  She does not drink sodas.  She is not on any diuretics.  She stopped ibuprofen and related products since hospital discharge.  She is taking tramadol for pain as needed  NSAIDs: None  Antiplts/Anticoagulants/Anti thrombotics: None  GI Procedures: EGD 02/17/2019 - Normal duodenal bulb and second portion of the duodenum. - Portal  hypertensive gastropathy. - Recently bleeding large (> 5 mm) esophageal varices. Incompletely eradicated. Banded. - No specimens collected.  Past Medical History:  Diagnosis Date  . Diabetes mellitus without complication (HCC)   . Hypertension   . Irritable bowel     Past Surgical History:  Procedure Laterality Date  . ESOPHAGOGASTRODUODENOSCOPY N/A 02/17/2019   Procedure: ESOPHAGOGASTRODUODENOSCOPY (EGD);  Surgeon: Toney ReilVanga, Rohini Reddy, MD;  Location[facility] END[facility]COPY;  Service: Gastroenterology;  Laterality: N/A;  . SHOULDER ARTHROTOMY    . TOTAL KNEE ARTHROPLASTY     Current Outpatient Medications:  .  acetaminophen (TYLENOL) 325 MG tablet, Take 2 tablets (650 mg total) by mouth every 6 (six) hours as needed for mild pain, moderate pain, fever or headache., Disp:  , Rfl:  .  CALCIUM PO, Take 1 tablet by mouth 2 (two) times daily., Disp: , Rfl:  .  dicyclomine (BENTYL) 20 MG tablet, Take 1 tablet (20 mg total) by mouth 3 (three) times daily as needed for spasms (abdominal cramping)., Disp: 20 tablet, Rfl: 0 .  esomeprazole (NEXIUM) 40 MG capsule, Take 1 capsule (40 mg total) by mouth 2 (two) times daily before a meal., Disp: 60 capsule, Rfl: 0 .  nadolol (CORGARD) 20 MG tablet, Take 1 tablet (20 mg total) by mouth daily., Disp: 30 tablet, Rfl: 0 .  ondansetron (ZOFRAN ODT) 4 MG disintegrating tablet, Take 1 tablet (4 mg total) by mouth every 8 (eight) hours as needed for nausea or vomiting., Disp: 20 tablet, Rfl: 0 .  PRESCRIPTION MEDICATION, Take 1 tablet by mouth 2 (two)  times daily., Disp: , Rfl:  .  traMADol (ULTRAM) 50 MG tablet, Take 1 tablet (50 mg total) by mouth every 6 (six) hours as needed for moderate pain or severe pain., Disp: 20 tablet, Rfl: 0 .  alum & mag hydroxide-simeth (MAALOX/MYLANTA) 200-200-20 MG/5ML suspension, Take 30 mLs by mouth every 4 (four) hours as needed for indigestion or heartburn. (Patient not taking: Reported on 02/28/2019), Disp: 355 mL, Rfl: 0 .   diphenhydrAMINE (BENADRYL) 25 mg capsule, Take 1 capsule (25 mg total) by mouth every 6 (six) hours as needed for itching. (Patient not taking: Reported on 02/28/2019), Disp: 30 capsule, Rfl: 0 .  METFORMIN HCL PO, Take 2 tablets by mouth 2 (two) times daily., Disp: , Rfl:  .  POTASSIUM PO, Take 1 tablet by mouth daily., Disp: , Rfl:     Family History  Problem Relation Age of Onset  . Osteoarthritis Mother   . Cancer Father      Social History   Tobacco Use  . Smoking status: Never Smoker  . Smokeless tobacco: Never Used  Substance Use Topics  . Alcohol use: No  . Drug use: No    Allergies as of 02/28/2019 - Review Complete 02/28/2019  Allergen Reaction Noted  . Hydrocodone-acetaminophen Nausea And Vomiting 04/28/2012  . Codeine Nausea And Vomiting 05/07/2017  . Lactose intolerance (gi)  05/07/2017    Review of Systems:    All systems reviewed and negative except where noted in HPI.   Physical Exam:  BP (!) 156/94   Pulse 74   Temp 98.9 F (37.2 C) (Oral)   Ht 5\' 3"  (1.6 m)   Wt 165 lb (74.8 kg)   BMI 29.23 kg/m  No LMP recorded. Patient is postmenopausal.  General:   Alert,  Well-developed, well-nourished, pleasant and cooperative in NAD Head:  Normocephalic and atraumatic. Eyes:  Sclera clear, no icterus.   Conjunctiva pink. Ears:  Normal auditory acuity. Nose:  No deformity, discharge, or lesions. Mouth:  No deformity or lesions,oropharynx pink & moist. Neck:  Supple; no masses or thyromegaly. Lungs:  Respirations even and unlabored.  Clear throughout to auscultation.   No wheezes, crackles, or rhonchi. No acute distress. Heart:  Regular rate and rhythm; no murmurs, clicks, rubs, or gallops. Abdomen:  Normal bowel sounds. Soft, non-tender and non-distended without masses, hepatosplenomegaly or hernias noted.  No guarding or rebound tenderness.   Rectal: Not performed Msk:  Symmetrical without gross deformities. Good, equal movement & strength bilaterally.  Pulses:  Normal pulses noted. Extremities:  No clubbing or edema.  No cyanosis. Neurologic:  Alert and oriented x3;  grossly normal neurologically. Skin:  Intact without significant lesions or rashes. No jaundice. Psych:  Alert and cooperative. Normal mood and affect.  Imaging Studies: Reviewed  Assessment and Plan:    Cirrhosis: decompensated, CPT score 6, class A, MELD-Na 13 Etiology: Presumed to be secondary to long-term NSAID use.  Acute viral hepatitis panel is negative.  I have asked patient to obtain the records from the [place] hepatology to make sure rest of the secondary liver disease work-up is pursued.  HIV negative, low ferritin  Portal hypertension:  1.Volume status: Ascites: None, 2gm sodium diet  2.PSE: None 3.Anemia/Thrombocytopenia/Coagulopathy: Has thrombocytopenia and mild coagulopathy, severe iron deficiency anemia, received IV iron.  Recheck CBC, iron studies 4.Varices: Large esophageal varices and portal hypertensive gastropathy, status post EGD on 02/17/2019, variceal ligation x4.  Continue nadolol 20 mg daily, will increase to 40 mg during next visit 5.HRS: None  6.HCC screening: [place] 02/2019 with no liver lesions, check AFP, CT in 09/2018 with no liver lesions other than cirrhosis  Health maintenance: 1.Hepatitis A/B vaccine status: Check hepatitis A and B total antibody 2.Influenza vaccine: Recommend annual influenza vaccine 3.Pneumonia vaccine: Unknown 4.Colonoscopy: 1 year ago at the [place], obtain records 5.Vitamin D levels: Unknown 6.Abstinence from ETOH:n/a 7.Avoid NSAIDs 8.2D Echo: EF 55 to 60%, normal right ventricular systolic function and no evidence of pulmonary hypertension 9.  Check hemoglobin A1c  Difficulty swallowing Continue Nexium 40 mg twice daily Follow-up on EGD  Evaluate for liver transplant: no due to low meld   Follow up in 4 to 6 weeks   Arlyss Repress, MD

## 2019-03-02 ENCOUNTER — Inpatient Hospital Stay: Payer: Medicare HMO | Attending: Internal Medicine | Admitting: Internal Medicine

## 2019-03-02 ENCOUNTER — Ambulatory Visit: Payer: Medicare HMO

## 2019-03-02 ENCOUNTER — Encounter: Payer: Self-pay | Admitting: Internal Medicine

## 2019-03-02 ENCOUNTER — Other Ambulatory Visit: Payer: Self-pay

## 2019-03-02 DIAGNOSIS — Z79899 Other long term (current) drug therapy: Secondary | ICD-10-CM | POA: Insufficient documentation

## 2019-03-02 DIAGNOSIS — K7460 Unspecified cirrhosis of liver: Secondary | ICD-10-CM | POA: Diagnosis not present

## 2019-03-02 DIAGNOSIS — R51 Headache: Secondary | ICD-10-CM | POA: Insufficient documentation

## 2019-03-02 DIAGNOSIS — K766 Portal hypertension: Secondary | ICD-10-CM | POA: Diagnosis not present

## 2019-03-02 DIAGNOSIS — D500 Iron deficiency anemia secondary to blood loss (chronic): Secondary | ICD-10-CM | POA: Diagnosis not present

## 2019-03-02 DIAGNOSIS — D731 Hypersplenism: Secondary | ICD-10-CM | POA: Diagnosis not present

## 2019-03-02 DIAGNOSIS — D61818 Other pancytopenia: Secondary | ICD-10-CM | POA: Insufficient documentation

## 2019-03-02 DIAGNOSIS — K922 Gastrointestinal hemorrhage, unspecified: Secondary | ICD-10-CM | POA: Diagnosis not present

## 2019-03-02 HISTORY — DX: Iron deficiency anemia secondary to blood loss (chronic): D50.0

## 2019-03-02 LAB — COMPREHENSIVE METABOLIC PANEL
ALT: 24 U/L (ref 0–44)
AST: 39 U/L (ref 15–41)
Albumin: 3.9 g/dL (ref 3.5–5.0)
Alkaline Phosphatase: 74 U/L (ref 38–126)
Anion gap: 7 (ref 5–15)
BUN: 17 mg/dL (ref 8–23)
CO2: 25 mmol/L (ref 22–32)
Calcium: 9.1 mg/dL (ref 8.9–10.3)
Chloride: 107 mmol/L (ref 98–111)
Creatinine, Ser: 0.99 mg/dL (ref 0.44–1.00)
GFR calc Af Amer: >60 mL/min (ref >60)
GFR calc non Af Amer: >60 mL/min (ref >60)
Glucose, Bld: 155 mg/dL — ABNORMAL HIGH (ref 70–99)
Potassium: 4.1 mmol/L (ref 3.5–5.1)
Sodium: 139 mmol/L (ref 135–145)
Total Bilirubin: 1.2 mg/dL (ref 0.3–1.2)
Total Protein: 7.5 g/dL (ref 6.5–8.1)

## 2019-03-02 LAB — PROTIME-INR
INR: 1.1 (ref 0.8–1.2)
Prothrombin Time: 14.5 seconds (ref 11.4–15.2)

## 2019-03-02 LAB — CBC WITH DIFFERENTIAL/PLATELET
Abs Immature Granulocytes: 0 10*3/uL (ref 0.00–0.07)
Basophils Absolute: 0 10*3/uL (ref 0.0–0.1)
Basophils Relative: 0 %
Eosinophils Absolute: 0.1 10*3/uL (ref 0.0–0.5)
Eosinophils Relative: 3 %
HCT: 32.1 % — ABNORMAL LOW (ref 36.0–46.0)
Hemoglobin: 10.3 g/dL — ABNORMAL LOW (ref 12.0–15.0)
Immature Granulocytes: 0 %
Lymphocytes Relative: 30 %
Lymphs Abs: 0.9 10*3/uL (ref 0.7–4.0)
MCH: 25.4 pg — ABNORMAL LOW (ref 26.0–34.0)
MCHC: 32.1 g/dL (ref 30.0–36.0)
MCV: 79.1 fL — ABNORMAL LOW (ref 80.0–100.0)
Monocytes Absolute: 0.2 10*3/uL (ref 0.1–1.0)
Monocytes Relative: 7 %
Neutro Abs: 1.8 10*3/uL (ref 1.7–7.7)
Neutrophils Relative %: 60 %
Platelets: 80 10*3/uL — ABNORMAL LOW (ref 150–400)
RBC: 4.06 MIL/uL (ref 3.87–5.11)
RDW: 20.2 % — ABNORMAL HIGH (ref 11.5–15.5)
WBC: 3 10*3/uL — ABNORMAL LOW (ref 4.0–10.5)
nRBC: 0 % (ref 0.0–0.2)

## 2019-03-02 LAB — APTT: aPTT: 32 seconds (ref 24–36)

## 2019-03-02 LAB — LACTATE DEHYDROGENASE: LDH: 138 U/L (ref 98–192)

## 2019-03-02 MED ORDER — TRAMADOL HCL 50 MG PO TABS: 50.0000 mg | ORAL_TABLET | Freq: Every day | ORAL | 0 refills | Status: DC | PRN

## 2019-03-02 NOTE — Assessment & Plan Note (Addendum)
#  Anemia of iron deficiency-second to chronic upper GI bleed  [see below].  No good response with p.o. iron.  Recommend IV iron.  #Recommend Venofer/secondary to insurance.  # Upper GIB/secondary variceal bleeding.  Status post banding. [Dr. Vanga].  Stable.  #Cirrhosis/portal hypertension/hypersplenism-pancytopenia-platelets 80s.  Monitor closely.  Discussed regarding not using NSAIDs.  Defer to GI for continued management.  #Chronic headaches-unclear etiology.  Improved with tramadol.  Again a prescription for tramadol short-term.  She will need to follow-up with PCP regarding further prescriptions.  Discussed the potential acute infusion reactions with IV iron; which are quite rare.  Patient understands the risk; will proceed with infusions.  # DISPOSITION: # labs today  # IV Venofer weekly x 2- start next week # follow up in 6 weeks- MD; labs- cbc;Possible Venofer- Dr.B

## 2019-03-02 NOTE — Progress Notes (Signed)
[facility] CONSULT NOTE  [facility] Care Team: [facility] as PCP - General (General Practice)  CHIEF COMPLAINTS/PURPOSE OF CONSULTATION: Iron deficient anemia cirrhosis   HEMATOLOGY HISTORY  #Anemia secondary to iron deficiency/chronic GI bleed; EGD August 2020-variceal bleed status post banding [Dr. Vanga]; [facility]]  #Cirrhosis [? [facility]IDS use; [facility]]/portal hypertension/thrombocytopenia-platelets 80s  HISTORY OF PRESENTING ILLNESS:  Kirsten Hettie HolsteinG Losier 63 y.o.  female has been referred to us for further recommendations for ongoing anemia.  Patient was recently admitted to hospital for upper GI/variceal bleed secondary to portal hypertension/cirrhosis.  Patient underwent EGD with incomplete banding.  Hemoglobin it was 7.6.  Patient received PRBC in the hospital.  Patient also received IV iron in the hospital.  She has been referred to us for further recommendations regarding IV iron.  Patient currently feels improved.  Continues to have fatigue.  No blood in stools or black or stools.  On p.o. iron.  Review of Systems  Constitutional: Positive for malaise/fatigue. Negative for chills, diaphoresis, fever and weight loss.  HENT: Negative for nosebleeds and sore throat.   Eyes: Negative for double vision.  Respiratory: Negative for cough, hemoptysis, sputum production, shortness of breath and wheezing.   Cardiovascular: Negative for chest pain, palpitations, orthopnea and leg swelling.  Gastrointestinal: Positive for blood in stool. Negative for abdominal pain, constipation, diarrhea, heartburn, melena, nausea and vomiting.  Genitourinary: Negative for dysuria, frequency and urgency.  Musculoskeletal: Positive for back pain and joint pain.  Skin: Negative.  Negative for itching and rash.  Neurological: Negative for dizziness, tingling, focal weakness, weakness and headaches.  Endo/Heme/Allergies: Bruises/bleeds easily.  Psychiatric/Behavioral:  Negative for depression. The patient is not nervous/anxious and does not have insomnia.     MEDICAL HISTORY:  Past Medical History:  Diagnosis Date  . Diabetes mellitus without complication (HCC)   . Hypertension   . Irritable bowel     SURGICAL HISTORY: Past Surgical History:  Procedure Laterality Date  . ESOPHAGOGASTRODUODENOSCOPY N/A 02/17/2019   Procedure: ESOPHAGOGASTRODUODENOSCOPY (EGD);  Surgeon: Toney ReilVanga, Rohini Reddy, MD;  [facility] END[facility]COPY;  Service: Gastroenterology;  Laterality: N/A;  . SHOULDER ARTHROTOMY    . TOTAL KNEE ARTHROPLASTY      SOCIAL HISTORY: Social History   Socioeconomic History  . Marital status: Married    Spouse name: Not on file  . Number of children: Not on file  . Years of education: Not on file  . Highest education level: Not on file  Occupational History  . Not on file  Social Needs  . Financial resource strain: Not on file  . Food insecurity    Worry: Not on file    Inability: Not on file  . Transportation needs    Medical: Not on file    Non-medical: Not on file  Tobacco Use  . Smoking status: Never Smoker  . Smokeless tobacco: Never Used  Substance and Sexual Activity  . Alcohol use: No  . Drug use: No  . Sexual activity: Not on file  Lifestyle  . Physical activity    Days per week: Not on file    Minutes per session: Not on file  . Stress: Not on file  Relationships  . Social Musicianconnections    Talks on phone: Not on file    Gets together: Not on file    Attends religious service: Not on file    Active member of club or organization: Not on file    Attends meetings of clubs  or organizations: Not on file    Relationship status: Not on file  . Intimate partner violence    Fear of current or ex partner: Not on file    Emotionally abused: Not on file    Physically abused: Not on file    Forced sexual activity: Not on file  Other Topics Concern  . Not on file  Social History Narrative   Lives in [place];  never smoked; no current alcohol; prior ocassional [facility]    FAMILY HISTORY: Family History  Problem Relation Age of Onset  . Osteoarthritis Mother   . Cancer Father        cirrhosis; alcoholic  . Cirrhosis Sister        alcoholic  . Cirrhosis Brother        alcoholic    ALLERGIES:  is allergic to hydrocodone-acetaminophen; codeine; and lactose intolerance (gi).  MEDICATIONS:  Current Outpatient Medications  Medication Sig Dispense Refill  . acetaminophen (TYLENOL) 325 MG tablet Take 2 tablets (650 mg total) by mouth every 6 (six) hours as needed for mild pain, moderate pain, fever or headache.    Marland Kitchen. alum & mag hydroxide-simeth (MAALOX/MYLANTA) 200-200-20 MG/5ML suspension Take 30 mLs by mouth every 4 (four) hours as needed for indigestion or heartburn. 355 mL 0  . CALCIUM PO Take 1 tablet by mouth 2 (two) times daily.    Marland Kitchen. dicyclomine (BENTYL) 20 MG tablet Take 1 tablet (20 mg total) by mouth 3 (three) times daily as needed for spasms (abdominal cramping). 20 tablet 0  . diphenhydrAMINE (BENADRYL) 25 mg capsule Take 1 capsule (25 mg total) by mouth every 6 (six) hours as needed for itching. 30 capsule 0  . esomeprazole (NEXIUM) 40 MG capsule Take 1 capsule (40 mg total) by mouth 2 (two) times daily before a meal. 60 capsule 0  . METFORMIN HCL PO Take 2 tablets by mouth 2 (two) times daily.    . nadolol (CORGARD) 20 MG tablet Take 1 tablet (20 mg total) by mouth daily. 30 tablet 0  . ondansetron (ZOFRAN ODT) 4 MG disintegrating tablet Take 1 tablet (4 mg total) by mouth every 8 (eight) hours as needed for nausea or vomiting. 20 tablet 0  . POTASSIUM PO Take 1 tablet by mouth daily.    Marland Kitchen. PRESCRIPTION MEDICATION Take 1 tablet by mouth 2 (two) times daily.    . traMADol (ULTRAM) 50 MG tablet Take 1 tablet (50 mg total) by mouth daily as needed for moderate pain or severe pain. 30 tablet 0   No current facility-administered medications for this visit.        PHYSICAL EXAMINATION:   Vitals:   03/02/19 1511  BP: (!) 130/94  Pulse: 100  Resp: 20  Temp: 97.8 F (36.6 C)   Filed Weights   03/02/19 1511  Weight: 165 lb 8 oz (75.1 kg)    Physical Exam  Constitutional: She is oriented to person, place, and time and well-developed, well-nourished, and in no distress.  HENT:  Head: Normocephalic and atraumatic.  Mouth/Throat: Oropharynx is clear and moist. No oropharyngeal exudate.  Eyes: Pupils are equal, round, and reactive to light.  Neck: Normal range of motion. Neck supple.  Cardiovascular: Normal rate and regular rhythm.  Pulmonary/Chest: Effort normal and breath sounds normal. No respiratory distress. She has no wheezes.  Abdominal: Soft. Bowel sounds are normal. She exhibits no distension and no mass. There is no abdominal tenderness. There is no rebound and no guarding.  Musculoskeletal: Normal range of motion.        General: No tenderness or edema.  Neurological: She is alert and oriented to person, place, and time.  Skin: Skin is warm.  Psychiatric: Affect normal.    LABORATORY DATA:  I have reviewed the data as listed Lab Results  Component Value Date   WBC 3.0 (L) 03/02/2019   HGB 10.3 (L) 03/02/2019   HCT 32.1 (L) 03/02/2019   MCV 79.1 (L) 03/02/2019   PLT 80 (L) 03/02/2019   Recent Labs    02/16/19 2056  02/18/19 0428 02/20/19 0412 02/21/19 0552 03/02/19 1530  NA 140   < > 137 137 137 139  K 3.2*   < > 3.6 3.1* 3.6 4.1  CL 110   < > 110 107 107 107  CO2 22   < > 21* 24 23 25   GLUCOSE 159*   < > 147* 123* 145* 155*  BUN 21   < > 14 11 13 17   CREATININE 0.95   < > 0.80 0.90 0.89 0.99  CALCIUM 8.5*   < > 8.6* 8.5* 8.5* 9.1  GFRNONAA >60   < > >60 >60 >60 >60  GFRAA >60   < > >60 >60 >60 >60  PROT 6.7  --  6.1*  --   --  7.5  ALBUMIN 3.5  --  3.4*  --   --  3.9  AST 37  --  40  --   --  39  ALT 23  --  23  --   --  24  ALKPHOS 70  --  66  --   --  74  BILITOT 1.5*  --  1.4*  --   --  1.2   < >  = values in this interval not displayed.     Dg Chest 2 View  Result Date: 02/16/2019 CLINICAL DATA:  Chest pain EXAM: CHEST - 2 VIEW COMPARISON:  None. FINDINGS: The heart size and mediastinal contours are within normal limits. Both lungs are clear. The visualized skeletal structures are unremarkable. IMPRESSION: No acute cardiopulmonary process. Electronically Signed   By: Prudencio Pair M.D.   On: 02/16/2019 21:10   US Abdomen Limited  Addendum Date: 02/19/2019   ADDENDUM REPORT: 02/19/2019 12:03 ADDENDUM: Due to small amount of ascites present, paracentesis procedure was not performed today. Electronically Signed   By: Aletta Edouard M.D.   On: 02/19/2019 12:03   Result Date: 02/19/2019 CLINICAL DATA:  Cirrhosis of liver. EXAM: LIMITED ABDOMEN ULTRASOUND FOR ASCITES TECHNIQUE: Limited ultrasound survey for ascites was performed in all four abdominal quadrants. COMPARISON:  None. FINDINGS: Small amount of ascites within the RIGHT upper quadrant, LEFT upper quadrant, RIGHT lower quadrant and LEFT lower quadrant. IMPRESSION: Small amount of ascites within each quadrant. Electronically Signed: By: Franki Cabot M.D. On: 02/19/2019 10:35   US Liver Doppler  Result Date: 02/17/2019 CLINICAL DATA:  63 year old with history of cirrhosis and esophageal varices. Recently presented with bloody emesis. EXAM: DUPLEX ULTRASOUND OF LIVER TECHNIQUE: Color and duplex Doppler ultrasound was performed to evaluate the hepatic in-flow and out-flow vessels. COMPARISON:  CT 09/18/2018 FINDINGS: Liver: Nodular hepatic contour is compatible with cirrhosis. Liver parenchyma is mildly heterogeneous. Gallbladder is mild-to-moderately distended with multiple echogenic stones. Trace perihepatic ascites. No focal lesion, mass or intrahepatic biliary ductal dilatation. Main Portal Vein size: 1.3 cm Portal Vein Velocities Main Prox:  31 cm/sec Main Mid: 25 cm/sec Main Dist:  26 cm/sec Right: 11 cm/sec  Left: 16 cm/sec Hepatic Vein  Velocities Right:  30 cm/sec Middle:  27 cm/sec Left:  18 cm/sec IVC: Present and patent with normal respiratory phasicity. Hepatic Artery Velocity:  71 cm/sec Splenic Vein Velocity:  25 cm/sec Spleen: 18 cm x 9 cm x 11 cm with a total volume of 912 cm^3 (411 cm^3 is upper limit normal) Portal Vein Occlusion/Thrombus: No Splenic Vein Occlusion/Thrombus: No Ascites: Present Varices: None Normal hepatopetal flow in the portal venous system. Normal hepatofugal flow in the hepatic veins. Perisplenic ascites. IMPRESSION: 1. Cirrhosis with portal hypertension demonstrated by splenomegaly and ascites. 2. Portal venous system is patent with normal direction of flow. 3. Cholelithiasis. Electronically Signed   By: Richarda Overlie M.D.   On: 02/17/2019 14:03    Iron deficiency anemia due to chronic blood loss #Anemia of iron deficiency-second to chronic upper GI bleed  [see below].  No good response with p.o. iron.  Recommend IV iron.  #Recommend Venofer/secondary to insurance.  # Upper GIB/secondary variceal bleeding.  Status post banding. [Dr. Vanga].  Stable.  #Cirrhosis/portal hypertension/hypersplenism-pancytopenia-platelets 80s.  Monitor closely.  Discussed regarding not using NSAIDs.  Defer to GI for continued management.  #Chronic headaches-unclear etiology.  Improved with tramadol.  Again a prescription for tramadol short-term.  She will need to follow-up with PCP regarding further prescriptions.  Discussed the potential acute infusion reactions with IV iron; which are quite rare.  Patient understands the risk; will proceed with infusions.  # DISPOSITION: # labs today  # IV Venofer weekly x 2- start next week # follow up in 6 weeks- MD; labs- cbc;Possible Venofer- Dr.B  All questions were answered. The patient knows to call the clinic with any problems, questions or concerns.   Earna Coder, MD 03/07/2019 9:26 AM

## 2019-03-07 ENCOUNTER — Encounter: Payer: Self-pay | Admitting: Internal Medicine

## 2019-03-08 ENCOUNTER — Other Ambulatory Visit: Payer: Self-pay

## 2019-03-08 DIAGNOSIS — I8510 Secondary esophageal varices without bleeding: Secondary | ICD-10-CM

## 2019-03-09 ENCOUNTER — Inpatient Hospital Stay: Payer: Non-veteran care | Attending: Internal Medicine

## 2019-03-16 ENCOUNTER — Inpatient Hospital Stay: Payer: Non-veteran care | Attending: Internal Medicine

## 2019-03-26 ENCOUNTER — Other Ambulatory Visit: Admission: RE | Admit: 2019-03-26 | Payer: Medicare HMO | Source: Ambulatory Visit

## 2019-03-29 ENCOUNTER — Encounter: Admission: RE | Payer: Self-pay | Source: Home / Self Care

## 2019-03-29 ENCOUNTER — Ambulatory Visit: Admission: RE | Admit: 2019-03-29 | Payer: Medicare HMO | Source: Home / Self Care | Admitting: Gastroenterology

## 2019-03-29 SURGERY — ESOPHAGOGASTRODUODENOSCOPY (EGD) WITH PROPOFOL
Anesthesia: General

## 2019-04-02 ENCOUNTER — Telehealth: Payer: Self-pay

## 2019-04-02 NOTE — Telephone Encounter (Signed)
LVM for pt to see if she would like to reschedule her EGD that was scheduled on 03/29/19 with Dr. Marius Ditch that she did not make.  Note to Schedule:  I85.10 (ICD-10-CM) - Secondary esophageal varices without bleeding ([place])  Thanks Sharyn Lull

## 2019-04-09 ENCOUNTER — Other Ambulatory Visit: Payer: Self-pay

## 2019-04-09 ENCOUNTER — Encounter (INDEPENDENT_AMBULATORY_CARE_PROVIDER_SITE_OTHER): Payer: Self-pay

## 2019-04-09 ENCOUNTER — Ambulatory Visit (INDEPENDENT_AMBULATORY_CARE_PROVIDER_SITE_OTHER): Payer: Medicare HMO | Admitting: Gastroenterology

## 2019-04-09 ENCOUNTER — Encounter: Payer: Self-pay | Admitting: Gastroenterology

## 2019-04-09 VITALS — BP 145/93 | HR 76 | Temp 98.9°F | Ht 63.0 in | Wt 168.2 lb

## 2019-04-09 DIAGNOSIS — I8510 Secondary esophageal varices without bleeding: Secondary | ICD-10-CM | POA: Diagnosis not present

## 2019-04-09 DIAGNOSIS — K7469 Other cirrhosis of liver: Secondary | ICD-10-CM

## 2019-04-09 DIAGNOSIS — K7460 Unspecified cirrhosis of liver: Secondary | ICD-10-CM | POA: Diagnosis not present

## 2019-04-09 MED ORDER — NADOLOL 20 MG PO TABS: 20.0000 mg | ORAL_TABLET | Freq: Every day | ORAL | 3 refills | Status: DC

## 2019-04-09 MED ORDER — ONDANSETRON HCL 4 MG PO TABS: 4.0000 mg | ORAL_TABLET | Freq: Three times a day (TID) | ORAL | 1 refills | Status: DC | PRN

## 2019-04-09 NOTE — Progress Notes (Signed)
Cephas Darby, MD [address]  [place]  [place], [place] [address]  Main: [phone]  Fax: [phone]    Gastroenterology Consultation  Referring Provider:     [facility] [place] Physician:  [facility] [place] Primary Gastroenterologist: [facility] Reason for Consultation:     Decompensated cirrhosis, hospital follow-up        HPI:   [name] is a 63 y.o. female referred by Dr. Domingo Madeira, [facility]  for consultation & management of decompensated cirrhosis, hospital follow-up.  Patient admitted to [facility] about 2 weeks ago secondary to massive hematemesis, underwent EGD which revealed large esophageal varices with stigmata and underwent band ligationx4.  She also has severe iron deficiency anemia, received IV iron.  She was discharged on nadolol and Bentyl.  She is known to have cirrhosis for last 4 years, her regular hepatologist is at the [place] and she was told that her cirrhosis is secondary to long-term NSAID use as reported by the patient.   Interval summary Since discharge from hospital, patient reports that she has been doing well.  Her bowel movements are currently brown in color.  Her energy levels are significantly improved but feels tired easily.  She denies nausea.  She does report discomfort in her chest whenever she tries to swallow that radiates to her mid thoracic area.  She is currently taking Nexium twice daily.  She denies swelling of legs, abdominal distention.  She denies insomnia, dark urine, yellowing of skin or eyes, itching, confusion.  She is maintaining low-sodium diet.  She does not drink sodas.  She is not on any diuretics.  She stopped ibuprofen and related products since hospital discharge.  She is taking tramadol for pain as needed  Follow-up visit 04/09/2019 Patient missed appointment for surveillance EGD for varices.  However, she reports to feel better.  She saw Dr. Rogue Bussing for iron deficiency and  received iron infusion.  She denies black stools, rectal bleeding.  She reports her energy levels are better.  She denies abdominal distention, swelling of legs.  She is not sure if she is taking nadolol.  She has been experiencing severe headaches associated with nausea for which she is taking tramadol.  She reports that she has appointment at the [facility] for her headaches.  She denies smoking or drinking alcohol  NSAIDs: None  Antiplts/Anticoagulants/Anti thrombotics: None  GI Procedures: EGD 02/17/2019 - Normal duodenal bulb and second portion of the duodenum. - Portal hypertensive gastropathy. - Recently bleeding large (> 5 mm) esophageal varices. Incompletely eradicated. Banded. - No specimens collected.  Past Medical History:  Diagnosis Date  . Diabetes mellitus without complication ([place])   . Hypertension   . Irritable bowel     Past Surgical History:  Procedure Laterality Date  . ESOPHAGOGASTRODUODENOSCOPY N/A 02/17/2019   Procedure: ESOPHAGOGASTRODUODENOSCOPY (EGD);  Surgeon: Lin Landsman, MD;  Location: [facility] ENDOSCOPY;  Service: Gastroenterology;  Laterality: N/A;  . SHOULDER ARTHROTOMY    . TOTAL KNEE ARTHROPLASTY     Current Outpatient Medications:  .  acetaminophen (TYLENOL) 325 MG tablet, Take 2 tablets (650 mg total) by mouth every 6 (six) hours as needed for mild pain, moderate pain, fever or headache., Disp:  , Rfl:  .  alum & mag hydroxide-simeth (MAALOX/MYLANTA) 200-200-20 MG/5ML suspension, Take 30 mLs by mouth every 4 (four) hours as needed for indigestion or heartburn., Disp: 355 mL, Rfl: 0 .  CALCIUM PO, Take 1 tablet by  mouth 2 (two) times daily., Disp: , Rfl:  .  dicyclomine (BENTYL) 20 MG tablet, Take 1 tablet (20 mg total) by mouth 3 (three) times daily as needed for spasms (abdominal cramping)., Disp: 20 tablet, Rfl: 0 .  diphenhydrAMINE (BENADRYL) 25 mg capsule, Take 1 capsule (25 mg total) by mouth every 6 (six) hours as needed for itching., Disp: 30  capsule, Rfl: 0 .  esomeprazole (NEXIUM) 40 MG capsule, Take 1 capsule (40 mg total) by mouth 2 (two) times daily before a meal., Disp: 60 capsule, Rfl: 0 .  ondansetron (ZOFRAN ODT) 4 MG disintegrating tablet, Take 1 tablet (4 mg total) by mouth every 8 (eight) hours as needed for nausea or vomiting., Disp: 20 tablet, Rfl: 0 .  POTASSIUM PO, Take 1 tablet by mouth daily., Disp: , Rfl:  .  PRESCRIPTION MEDICATION, Take 1 tablet by mouth 2 (two) times daily., Disp: , Rfl:  .  traMADol (ULTRAM) 50 MG tablet, Take 1 tablet (50 mg total) by mouth daily as needed for moderate pain or severe pain., Disp: 30 tablet, Rfl: 0 .  nadolol (CORGARD) 20 MG tablet, Take 1 tablet (20 mg total) by mouth daily., Disp: 30 tablet, Rfl: 3 .  ondansetron (ZOFRAN) 4 MG tablet, Take 1 tablet (4 mg total) by mouth every 8 (eight) hours as needed for nausea or vomiting., Disp: 30 tablet, Rfl: 1    Family History  Problem Relation Age of Onset  . Osteoarthritis Mother   . Cancer Father        cirrhosis; alcoholic  . Cirrhosis Sister        alcoholic  . Cirrhosis Brother        alcoholic     Social History   Tobacco Use  . Smoking status: Never Smoker  . Smokeless tobacco: Never Used  Substance Use Topics  . Alcohol use: No  . Drug use: No    Allergies as of 04/09/2019 - Review Complete 04/09/2019  Allergen Reaction Noted  . Hydrocodone-acetaminophen Nausea And Vomiting 04/28/2012  . Codeine Nausea And Vomiting 05/07/2017  . Lactose intolerance (gi)  05/07/2017    Review of Systems:    All systems reviewed and negative except where noted in HPI.   Physical Exam:  BP (!) 145/93 (BP Location: Left Arm, Patient Position: Sitting, Cuff Size: Normal)   Pulse 76   Temp 98.9 F (37.2 C) (Oral)   Ht 5\' 3"  (1.6 m)   Wt 168 lb 4 oz (76.3 kg)   BMI 29.80 kg/m  No LMP recorded. Patient is postmenopausal.  General:   Alert,  Well-developed, well-nourished, pleasant and cooperative in NAD Head:   Normocephalic and atraumatic. Eyes:  Sclera clear, no icterus.   Conjunctiva pink. Ears:  Normal auditory acuity. Nose:  No deformity, discharge, or lesions. Mouth:  No deformity or lesions,oropharynx pink & moist. Neck:  Supple; no masses or thyromegaly. Lungs:  Respirations even and unlabored.  Clear throughout to auscultation.   No wheezes, crackles, or rhonchi. No acute distress. Heart:  Regular rate and rhythm; no murmurs, clicks, rubs, or gallops. Abdomen:  Normal bowel sounds. Soft, non-tender and non-distended without masses, hepatosplenomegaly or hernias noted.  No guarding or rebound tenderness.   Rectal: Not performed Msk:  Symmetrical without gross deformities. Good, equal movement & strength bilaterally. Pulses:  Normal pulses noted. Extremities:  No clubbing or edema.  No cyanosis. Neurologic:  Alert and oriented x3;  grossly normal neurologically. Skin:  Intact without significant lesions or rashes.  No jaundice. Psych:  Alert and cooperative. Normal mood and affect.  Imaging Studies: Reviewed  Assessment and Plan:    Cirrhosis: decompensated, CPT score 6, class A, MELD-Na 13 Etiology: Presumed to be secondary to long-term NSAID use.  Acute viral hepatitis panel is negative.  Will perform rest of the secondary liver disease work-up today.  HIV negative, low ferritin  Portal hypertension:  1.Volume status: Ascites: None, 2gm sodium diet  2.PSE: None 3.Anemia/Thrombocytopenia/Coagulopathy: Has thrombocytopenia and mild coagulopathy, severe iron deficiency anemia, received IV iron.  Recheck CBC, iron studies 4.Varices: Large esophageal varices and portal hypertensive gastropathy, status post EGD on 02/17/2019, variceal ligation x4.  Continue nadolol 20 mg daily, patient reports having a BP and heart rate monitor at home.  Advised her to increase nadolol to 40 mg if her heart rate is in 70s and blood pressure 120s/80s on current dose.  Refills provided and reviewed side effects  of nadolol 5.HRS: None 6.HCC screening: US 02/2019 with no liver lesions, check AFP, CT in 09/2018 with no liver lesions other than cirrhosis  Health maintenance: 1.Hepatitis A/B vaccine status: Check hepatitis A and B total antibody 2.Influenza vaccine: Recommend annual influenza vaccine 3.Pneumonia vaccine: Unknown 4.[facility]: [facility] 5.Vitamin D levels: Unknown 6.Abstinence from ETOH:n/a 7.Avoid NSAIDs 8.2D Echo: EF 55 to 60%, normal right ventricular systolic function and no evidence of pulmonary hypertension 9.  Check hemoglobin A1c  Difficulty swallowing Continue Nexium 40 mg twice daily Follow-up on EGD  Evaluate for liver transplant: no due to low meld   Follow up in 4 to 6 weeks   Arlyss Repressohini R Makai Dumond, MD

## 2019-04-11 LAB — HEPATITIS A ANTIBODY, TOTAL: hep A Total Ab: POSITIVE — AB

## 2019-04-11 LAB — MITOCHONDRIAL/SMOOTH MUSCLE AB PNL
Mitochondrial Ab: <20 Units (ref 0.0–20.0)
Smooth Muscle Ab: 14 Units (ref 0–19)

## 2019-04-11 LAB — IRON AND TIBC
Iron Saturation: 38 % (ref 15–55)
Iron: 82 ug/dL (ref 27–139)
Total Iron Binding Capacity: 216 ug/dL — ABNORMAL LOW (ref 250–450)
UIBC: 134 ug/dL (ref 118–369)

## 2019-04-11 LAB — CBC
Hematocrit: 37.4 % (ref 34.0–46.6)
Hemoglobin: 12.2 g/dL (ref 11.1–15.9)
MCH: 26.1 pg — ABNORMAL LOW (ref 26.6–33.0)
MCHC: 32.6 g/dL (ref 31.5–35.7)
MCV: 80 fL (ref 79–97)
Platelets: 55 10*3/uL — CL (ref 150–450)
RBC: 4.68 x10E6/uL (ref 3.77–5.28)
RDW: 18.2 % — ABNORMAL HIGH (ref 11.7–15.4)
WBC: 2.3 10*3/uL — CL (ref 3.4–10.8)

## 2019-04-11 LAB — TISSUE TRANSGLUTAMINASE, IGA: Transglutaminase IgA: <2 U/mL (ref 0–3)

## 2019-04-11 LAB — HEMOGLOBIN A1C
Est. average glucose Bld gHb Est-mCnc: 131 mg/dL
Hgb A1c MFr Bld: 6.2 % — ABNORMAL HIGH (ref 4.8–5.6)

## 2019-04-11 LAB — AFP TUMOR MARKER: AFP, Serum, Tumor Marker: 1.6 ng/mL (ref 0.0–8.3)

## 2019-04-11 LAB — HEPATITIS B SURFACE ANTIBODY,QUALITATIVE: Hep B Surface Ab, Qual: NONREACTIVE

## 2019-04-11 LAB — ANTI-MICROSOMAL ANTIBODY LIVER / KIDNEY: LKM1 Ab: 1.1 Units (ref 0.0–20.0)

## 2019-04-11 LAB — ANA: Anti Nuclear Antibody (ANA): POSITIVE — AB

## 2019-04-11 LAB — CERULOPLASMIN: Ceruloplasmin: 23.8 mg/dL (ref 19.0–39.0)

## 2019-04-11 LAB — IGA: IgA/Immunoglobulin A, Serum: 395 mg/dL — ABNORMAL HIGH (ref 87–352)

## 2019-04-11 LAB — FERRITIN: Ferritin: 663 ng/mL — ABNORMAL HIGH (ref 15–150)

## 2019-04-11 LAB — ALPHA-1-ANTITRYPSIN: A-1 Antitrypsin: 141 mg/dL (ref 101–187)

## 2019-04-12 ENCOUNTER — Other Ambulatory Visit: Payer: Self-pay

## 2019-04-12 DIAGNOSIS — D500 Iron deficiency anemia secondary to blood loss (chronic): Secondary | ICD-10-CM

## 2019-04-13 ENCOUNTER — Ambulatory Visit: Payer: Medicare HMO

## 2019-04-13 ENCOUNTER — Inpatient Hospital Stay: Payer: Medicare HMO | Attending: Internal Medicine

## 2019-04-13 ENCOUNTER — Inpatient Hospital Stay (HOSPITAL_BASED_OUTPATIENT_CLINIC_OR_DEPARTMENT_OTHER): Payer: Medicare HMO | Admitting: Internal Medicine

## 2019-04-13 ENCOUNTER — Other Ambulatory Visit: Payer: Self-pay

## 2019-04-13 DIAGNOSIS — D500 Iron deficiency anemia secondary to blood loss (chronic): Secondary | ICD-10-CM

## 2019-04-13 DIAGNOSIS — E119 Type 2 diabetes mellitus without complications: Secondary | ICD-10-CM | POA: Diagnosis not present

## 2019-04-13 DIAGNOSIS — K922 Gastrointestinal hemorrhage, unspecified: Secondary | ICD-10-CM | POA: Insufficient documentation

## 2019-04-13 DIAGNOSIS — Z79899 Other long term (current) drug therapy: Secondary | ICD-10-CM | POA: Insufficient documentation

## 2019-04-13 DIAGNOSIS — D61818 Other pancytopenia: Secondary | ICD-10-CM | POA: Diagnosis not present

## 2019-04-13 DIAGNOSIS — D731 Hypersplenism: Secondary | ICD-10-CM | POA: Insufficient documentation

## 2019-04-13 DIAGNOSIS — I10 Essential (primary) hypertension: Secondary | ICD-10-CM | POA: Insufficient documentation

## 2019-04-13 DIAGNOSIS — K766 Portal hypertension: Secondary | ICD-10-CM | POA: Diagnosis not present

## 2019-04-13 DIAGNOSIS — K7460 Unspecified cirrhosis of liver: Secondary | ICD-10-CM | POA: Insufficient documentation

## 2019-04-13 DIAGNOSIS — K589 Irritable bowel syndrome without diarrhea: Secondary | ICD-10-CM | POA: Diagnosis not present

## 2019-04-13 LAB — CBC WITH DIFFERENTIAL/PLATELET
Abs Immature Granulocytes: 0.01 10*3/uL (ref 0.00–0.07)
Basophils Absolute: 0 10*3/uL (ref 0.0–0.1)
Basophils Relative: 0 %
Eosinophils Absolute: 0.1 10*3/uL (ref 0.0–0.5)
Eosinophils Relative: 2 %
HCT: 36 % (ref 36.0–46.0)
Hemoglobin: 12.3 g/dL (ref 12.0–15.0)
Immature Granulocytes: 0 %
Lymphocytes Relative: 28 %
Lymphs Abs: 0.9 10*3/uL (ref 0.7–4.0)
MCH: 26.5 pg (ref 26.0–34.0)
MCHC: 34.2 g/dL (ref 30.0–36.0)
MCV: 77.4 fL — ABNORMAL LOW (ref 80.0–100.0)
Monocytes Absolute: 0.2 10*3/uL (ref 0.1–1.0)
Monocytes Relative: 7 %
Neutro Abs: 1.9 10*3/uL (ref 1.7–7.7)
Neutrophils Relative %: 63 %
Platelets: 66 10*3/uL — ABNORMAL LOW (ref 150–400)
RBC: 4.65 MIL/uL (ref 3.87–5.11)
RDW: 16.5 % — ABNORMAL HIGH (ref 11.5–15.5)
WBC: 3 10*3/uL — ABNORMAL LOW (ref 4.0–10.5)
nRBC: 0 % (ref 0.0–0.2)

## 2019-04-13 LAB — FERRITIN: Ferritin: 370 ng/mL — ABNORMAL HIGH (ref 11–307)

## 2019-04-13 LAB — IRON AND TIBC
Iron: 91 ug/dL (ref 28–170)
Saturation Ratios: 37 % — ABNORMAL HIGH (ref 10.4–31.8)
TIBC: 247 ug/dL — ABNORMAL LOW (ref 250–450)
UIBC: 156 ug/dL

## 2019-04-13 MED ORDER — TRAMADOL HCL 50 MG PO TABS: 50.0000 mg | ORAL_TABLET | Freq: Every day | ORAL | 0 refills | Status: DC | PRN

## 2019-04-13 NOTE — Assessment & Plan Note (Addendum)
#  Anemia of iron deficiency-second to chronic upper GI bleed  [see below].  Status post IV iron hemoglobin today is 12.3.  Hold any more IV iron. # Upper GIB/secondary variceal bleeding.  Status post banding. [Dr. Vanga].  Stable.   #Cirrhosis/portal hypertension/hypersplenism-pancytopenia-platelets 60-80s.   #Chronic headaches-unclear etiology. [facility]- 4-5 months [facility]  Improved with tramadol.  Again a prescription for tramadol short-term given; [facility] for [facility].   # DISPOSITION: # No Venofer today # follow up 3 months- MD; labs- cbc;cmp/ Possible Venofer- Dr.B

## 2019-04-13 NOTE — Progress Notes (Signed)
[place] NOTE  Patient Care Team: [facility] [place] as PCP - General (General Practice)  CHIEF COMPLAINTS/PURPOSE OF CONSULTATION: Iron deficient anemia cirrhosis   HEMATOLOGY HISTORY  #Anemia secondary to iron deficiency/chronic GI bleed; EGD August 2020-variceal bleed status post banding [Dr. Vanga]; [facility]]; IV Venofer-improved hemoglobin 12.  #Cirrhosis [? NSAIDS use; [facility]]/portal hypertension/thrombocytopenia-platelets 80s  HISTORY OF PRESENTING ILLNESS:  [name] 63 y.o.  female iron deficient anemia secondary to GI bleed/portal hypertension cirrhosis is here for follow-up.  Patient received IV iron on a weekly basis.  Notes a significant improvement of her energy levels.  No bowel blood in stools or black or stools.  Chronic headaches.  Patient stopped taking Motrin because of low platelets.  Patient has been taking tramadol with improvement.  Request another refill.  [facility].  Review of Systems  Constitutional: Positive for malaise/fatigue. Negative for chills, diaphoresis, fever and weight loss.  HENT: Negative for nosebleeds and sore throat.   Eyes: Negative for double vision.  Respiratory: Negative for cough, hemoptysis, sputum production, shortness of breath and wheezing.   Cardiovascular: Negative for chest pain, palpitations, orthopnea and leg swelling.  Gastrointestinal: Positive for blood in stool. Negative for abdominal pain, constipation, diarrhea, heartburn, melena, nausea and vomiting.  Genitourinary: Negative for dysuria, frequency and urgency.  Musculoskeletal: Positive for back pain and joint pain.  Skin: Negative.  Negative for itching and rash.  Neurological: Positive for headaches. Negative for dizziness, tingling, focal weakness and weakness.  Endo/Heme/Allergies: Bruises/bleeds easily.  Psychiatric/Behavioral: Negative for depression. The patient is not nervous/anxious  and does not have insomnia.     MEDICAL HISTORY:  Past Medical History:  Diagnosis Date  . Diabetes mellitus without complication ([place])   . Hypertension   . Irritable bowel     SURGICAL HISTORY: Past Surgical History:  Procedure Laterality Date  . ESOPHAGOGASTRODUODENOSCOPY N/A 02/17/2019   Procedure: ESOPHAGOGASTRODUODENOSCOPY (EGD);  Surgeon: Lin Landsman, MD;  Location: [facility] ENDOSCOPY;  Service: Gastroenterology;  Laterality: N/A;  . SHOULDER ARTHROTOMY    . TOTAL KNEE ARTHROPLASTY      SOCIAL HISTORY: Social History   Socioeconomic History  . Marital status: Married    Spouse name: Not on file  . Number of children: Not on file  . Years of education: Not on file  . Highest education level: Not on file  Occupational History  . Not on file  Social Needs  . Financial resource strain: Not on file  . Food insecurity    Worry: Not on file    Inability: Not on file  . Transportation needs    Medical: Not on file    Non-medical: Not on file  Tobacco Use  . Smoking status: Never Smoker  . Smokeless tobacco: Never Used  Substance and Sexual Activity  . Alcohol use: No  . Drug use: No  . Sexual activity: Not on file  Lifestyle  . Physical activity    Days per week: Not on file    Minutes per session: Not on file  . Stress: Not on file  Relationships  . Social Herbalist on phone: Not on file    Gets together: Not on file    Attends religious service: Not on file    Active member of club or organization: Not on file    Attends meetings of clubs or organizations: Not on file    Relationship status: Not  on file  . Intimate partner violence    Fear of current or ex partner: Not on file    Emotionally abused: Not on file    Physically abused: Not on file    Forced sexual activity: Not on file  Other Topics Concern  . Not on file  Social History Narrative   Lives in [place] with husband; never smoked; no current alcohol; prior ocassional alcohol.  [facility]    FAMILY HISTORY: Family History  Problem Relation Age of Onset  . Osteoarthritis Mother   . Cancer Father        cirrhosis; alcoholic  . Cirrhosis Sister        alcoholic  . Cirrhosis Brother        alcoholic    ALLERGIES:  is allergic to hydrocodone-acetaminophen; codeine; and lactose intolerance (gi).  MEDICATIONS:  Current Outpatient Medications  Medication Sig Dispense Refill  . acetaminophen (TYLENOL) 325 MG tablet Take 2 tablets (650 mg total) by mouth every 6 (six) hours as needed for mild pain, moderate pain, fever or headache.    Marland Kitchen. alum & mag hydroxide-simeth (MAALOX/MYLANTA) 200-200-20 MG/5ML suspension Take 30 mLs by mouth every 4 (four) hours as needed for indigestion or heartburn. 355 mL 0  . CALCIUM PO Take 1 tablet by mouth 2 (two) times daily.    Marland Kitchen. dicyclomine (BENTYL) 20 MG tablet Take 1 tablet (20 mg total) by mouth 3 (three) times daily as needed for spasms (abdominal cramping). 20 tablet 0  . diphenhydrAMINE (BENADRYL) 25 mg capsule Take 1 capsule (25 mg total) by mouth every 6 (six) hours as needed for itching. 30 capsule 0  . esomeprazole (NEXIUM) 40 MG capsule Take 1 capsule (40 mg total) by mouth 2 (two) times daily before a meal. 60 capsule 0  . nadolol (CORGARD) 20 MG tablet Take 1 tablet (20 mg total) by mouth daily. 30 tablet 3  . ondansetron (ZOFRAN ODT) 4 MG disintegrating tablet Take 1 tablet (4 mg total) by mouth every 8 (eight) hours as needed for nausea or vomiting. 20 tablet 0  . ondansetron (ZOFRAN) 4 MG tablet Take 1 tablet (4 mg total) by mouth every 8 (eight) hours as needed for nausea or vomiting. 30 tablet 1  . POTASSIUM PO Take 1 tablet by mouth daily.    Marland Kitchen. PRESCRIPTION MEDICATION Take 1 tablet by mouth 2 (two) times daily.    . traMADol (ULTRAM) 50 MG tablet Take 1 tablet (50 mg total) by mouth daily as needed for moderate pain or severe pain. 30 tablet 0   No current facility-administered medications for this visit.        PHYSICAL EXAMINATION:   Vitals:   04/13/19 1355  BP: (!) 161/78  Pulse: (!) 58  Temp: 98 F (36.7 C)   Filed Weights   04/13/19 1355  Weight: 166 lb (75.3 kg)    Physical Exam  Constitutional: She is oriented to person, place, and time and well-developed, well-nourished, and in no distress.  HENT:  Head: Normocephalic and atraumatic.  Mouth/Throat: Oropharynx is clear and moist. No oropharyngeal exudate.  Eyes: Pupils are equal, round, and reactive to light.  Neck: Normal range of motion. Neck supple.  Cardiovascular: Normal rate and regular rhythm.  Pulmonary/Chest: Effort normal and breath sounds normal. No respiratory distress. She has no wheezes.  Abdominal: Soft. Bowel sounds are normal. She exhibits no distension and no mass. There is no abdominal tenderness. There is no rebound and no guarding.  Musculoskeletal: Normal  range of motion.        General: No tenderness or edema.  Neurological: She is alert and oriented to person, place, and time.  Skin: Skin is warm.  Psychiatric: Affect normal.    LABORATORY DATA:  I have reviewed the data as listed Lab Results  Component Value Date   WBC 3.0 (L) 04/13/2019   HGB 12.3 04/13/2019   HCT 36.0 04/13/2019   MCV 77.4 (L) 04/13/2019   PLT 66 (L) 04/13/2019   Recent Labs    02/16/19 2056  02/18/19 0428 02/20/19 0412 02/21/19 0552 03/02/19 1530  NA 140   < > 137 137 137 139  K 3.2*   < > 3.6 3.1* 3.6 4.1  CL 110   < > 110 107 107 107  CO2 22   < > 21* 24 23 25   GLUCOSE 159*   < > 147* 123* 145* 155*  BUN 21   < > 14 11 13 17   CREATININE 0.95   < > 0.80 0.90 0.89 0.99  CALCIUM 8.5*   < > 8.6* 8.5* 8.5* 9.1  GFRNONAA >60   < > >60 >60 >60 >60  GFRAA >60   < > >60 >60 >60 >60  PROT 6.7  --  6.1*  --   --  7.5  ALBUMIN 3.5  --  3.4*  --   --  3.9  AST 37  --  40  --   --  39  ALT 23  --  23  --   --  24  ALKPHOS 70  --  66  --   --  74  BILITOT 1.5*  --  1.4*  --   --  1.2   < > = values in this  interval not displayed.     No results found.  Iron deficiency anemia due to chronic blood loss #Anemia of iron deficiency-second to chronic upper GI bleed  [see below].  Status post IV iron hemoglobin today is 12.3.  Hold any more IV iron. # Upper GIB/secondary variceal bleeding.  Status post banding. [Dr. Vanga].  Stable.   #Cirrhosis/portal hypertension/hypersplenism-pancytopenia-platelets 60-80s.   #Chronic headaches-unclear etiology. [facility]- [facility]-5 [facility]  Improved with tramadol.  Again a prescription for tramadol short-term given; [facility].   # DISPOSITION: # No Venofer today # follow up 3 months- MD; labs- cbc;cmp/ Possible Venofer- Dr.B  All questions were answered. The patient knows to call the clinic with any problems, questions or concerns.   , MD 04/13/2019 2:19 PM

## 2019-04-23 ENCOUNTER — Other Ambulatory Visit: Admission: RE | Admit: 2019-04-23 | Payer: Medicare HMO | Source: Ambulatory Visit

## 2019-04-23 ENCOUNTER — Telehealth: Payer: Self-pay | Admitting: Gastroenterology

## 2019-04-23 NOTE — Telephone Encounter (Signed)
Pt left vm to r/s her procedure for 04/26/19 please call pt

## 2019-04-25 NOTE — Telephone Encounter (Signed)
Procedure was cancelled on 04/24/2019, LVM asking pt to call and reschedule procedure

## 2019-04-26 ENCOUNTER — Encounter: Admission: RE | Payer: Self-pay | Source: Home / Self Care

## 2019-04-26 ENCOUNTER — Ambulatory Visit: Admission: RE | Admit: 2019-04-26 | Payer: Medicare HMO | Source: Home / Self Care | Admitting: Gastroenterology

## 2019-04-26 SURGERY — ESOPHAGOGASTRODUODENOSCOPY (EGD) WITH PROPOFOL
Anesthesia: General

## 2019-05-21 ENCOUNTER — Ambulatory Visit: Payer: Medicare HMO | Admitting: Gastroenterology

## 2019-05-21 ENCOUNTER — Encounter (INDEPENDENT_AMBULATORY_CARE_PROVIDER_SITE_OTHER): Payer: Self-pay

## 2019-05-21 ENCOUNTER — Other Ambulatory Visit: Payer: Self-pay

## 2019-05-21 ENCOUNTER — Encounter: Payer: Self-pay | Admitting: Gastroenterology

## 2019-05-21 VITALS — BP 136/87 | HR 59 | Temp 98.4°F | Resp 16 | Ht 63.0 in | Wt 172.0 lb

## 2019-05-21 DIAGNOSIS — K7290 Hepatic failure, unspecified without coma: Secondary | ICD-10-CM

## 2019-05-21 DIAGNOSIS — I8510 Secondary esophageal varices without bleeding: Secondary | ICD-10-CM

## 2019-05-21 DIAGNOSIS — K7460 Unspecified cirrhosis of liver: Secondary | ICD-10-CM

## 2019-05-21 DIAGNOSIS — K7469 Other cirrhosis of liver: Secondary | ICD-10-CM | POA: Diagnosis not present

## 2019-05-21 MED ORDER — NADOLOL 20 MG PO TABS: 20.0000 mg | ORAL_TABLET | Freq: Every day | ORAL | 3 refills | Status: DC

## 2019-05-21 NOTE — Progress Notes (Signed)
Kirsten Repressohini R Rechy Bost, MD [address]  [address]  [place], [place] [address]  Main: [phone]  Fax: [phone]    Gastroenterology Consultation  Referring Provider:     [facility] Primary Care Physician:  [facility] Primary [facility]enterologist: [facility] Reason for Consultation:     Decompensated cirrhosis        HPI:   [name] is a 63 y.o. female referred by Dr. Eli Phillipsenter, [facility]  for consultation & management of decompensated cirrhosis, hospital follow-up.  Patient [facility] about 2 weeks ago secondary to massive hematemesis, underwent EGD which revealed large esophageal varices with stigmata and underwent band ligationx4.  She also has severe iron deficiency anemia, received IV iron.  She was discharged on nadolol and Bentyl.  She is known to have cirrhosis for last 4 years, [facility] and she was told that her cirrhosis is secondary to long-term NSAID use as reported by the patient.   Interval summary Since discharge from hospital, patient reports that she has been doing well.  Her bowel movements are currently brown in color.  Her energy levels are significantly improved but feels tired easily.  She denies nausea.  She does report discomfort in her chest whenever she tries to swallow that radiates to her mid thoracic area.  She is currently taking Nexium twice daily.  She denies swelling of legs, abdominal distention.  She denies insomnia, dark urine, yellowing of skin or eyes, itching, confusion.  She is maintaining low-sodium diet.  She does not drink sodas.  She is not on any diuretics.  She stopped ibuprofen and related products since hospital discharge.  She is taking tramadol for pain as needed  Follow-up visit 04/09/2019 Patient missed appointment for surveillance EGD for varices.  However, she reports to feel better.  She saw Dr. Donneta RombergBrahmanday for iron deficiency and received iron  infusion.  She denies black stools, rectal bleeding.  She reports her energy levels are better.  She denies abdominal distention, swelling of legs.  She is not sure if she is taking nadolol.  She has been experiencing severe headaches associated with nausea for which she is taking tramadol.  She [facility].  She denies smoking or drinking alcohol  Follow-up visit 05/21/2019 She continues to do well from cirrhosis standpoint.  She is tolerating nadolol well.  Her heart rate is 59 today.  She does report swelling around the ankles, back pain for which she takes tramadol and deep heating massage which provides relief.  She denies black stools, hematemesis, altered sleep rhythm.  She does not smoke or drink alcohol.  She gained about 6 pounds since last visit, she incorporates more rice in her diet.  She did not get the surveillance endoscopy at  NSAIDs: None  Antiplts/Anticoagulants/Anti thrombotics: None  GI Procedures: EGD 02/17/2019 - Normal duodenal bulb and second portion of the duodenum. - Portal hypertensive gastropathy. - Recently bleeding large (> 5 mm) esophageal varices. Incompletely eradicated. Banded. - No specimens collected.  Past Medical History:  Diagnosis Date  . Diabetes mellitus without complication (HCC)   . Hypertension   . Irritable bowel     Past Surgical History:  Procedure Laterality Date  . ESOPHAGOGASTRODUODENOSCOPY N/A 02/17/2019   Procedure: ESOPHAGOGASTRODUODENOSCOPY (EGD);  Surgeon: Toney ReilVanga, Nicolis Boody Reddy, MD;  [facility];  Service: Gastroenterology;  Laterality: N/A;  . SHOULDER ARTHROTOMY    .  TOTAL KNEE ARTHROPLASTY     Current Outpatient Medications:  .  CALCIUM PO, Take 1 tablet by mouth 2 (two) times daily., Disp: , Rfl:  .  nadolol (CORGARD) 20 MG tablet, Take 1 tablet (20 mg total) by mouth daily., Disp: 30 tablet, Rfl: 3 .  ondansetron (ZOFRAN ODT) 4 MG disintegrating tablet, Take 1 tablet  (4 mg total) by mouth every 8 (eight) hours as needed for nausea or vomiting., Disp: 20 tablet, Rfl: 0 .  POTASSIUM PO, Take 1 tablet by mouth daily., Disp: , Rfl:  .  PRESCRIPTION MEDICATION, Take 1 tablet by mouth 2 (two) times daily., Disp: , Rfl:  .  traMADol (ULTRAM) 50 MG tablet, Take 1 tablet (50 mg total) by mouth daily as needed for moderate pain or severe pain., Disp: 30 tablet, Rfl: 0 .  esomeprazole (NEXIUM) 40 MG capsule, Take 1 capsule (40 mg total) by mouth 2 (two) times daily before a meal. (Patient not taking: Reported on 05/21/2019), Disp: 60 capsule, Rfl: 0    Family History  Problem Relation Age of Onset  . Osteoarthritis Mother   . Cancer Father        cirrhosis; alcoholic  . Cirrhosis Sister        alcoholic  . Cirrhosis Brother        alcoholic     Social History   Tobacco Use  . Smoking status: Never Smoker  . Smokeless tobacco: Never Used  Substance Use Topics  . Alcohol use: No  . Drug use: No    Allergies as of 05/21/2019 - Review Complete 05/21/2019  Allergen Reaction Noted  . Hydrocodone-acetaminophen Nausea And Vomiting 04/28/2012  . Codeine Nausea And Vomiting 05/07/2017  . Lactose intolerance (gi)  05/07/2017    Review of Systems:    All systems reviewed and negative except where noted in HPI.   Physical Exam:  BP 136/87 (BP Location: Left Arm, Patient Position: Sitting, Cuff Size: Normal)   Pulse (!) 59   Temp 98.4 F (36.9 C)   Resp 16   Ht 5\' 3"  (1.6 m)   Wt 172 lb (78 kg)   BMI 30.47 kg/m  No LMP recorded. Patient is postmenopausal.  General:   Alert,  Well-developed, well-nourished, pleasant and cooperative in NAD Head:  Normocephalic and atraumatic. Eyes:  Sclera clear, no icterus.   Conjunctiva pink. Ears:  Normal auditory acuity. Nose:  No deformity, discharge, or lesions. Mouth:  No deformity or lesions,oropharynx pink & moist. Neck:  Supple; no masses or thyromegaly. Lungs:  Respirations even and unlabored.  Clear  throughout to auscultation.   No wheezes, crackles, or rhonchi. No acute distress. Heart:  Regular rate and rhythm; no murmurs, clicks, rubs, or gallops. Abdomen:  Normal bowel sounds. Soft, non-tender and non-distended without masses, hepatosplenomegaly or hernias noted.  No guarding or rebound tenderness.   Rectal: Not performed Msk:  Symmetrical without gross deformities. Good, equal movement & strength bilaterally. Pulses:  Normal pulses noted. Extremities:  No clubbing or edema.  No cyanosis. Neurologic:  Alert and oriented x3;  grossly normal neurologically. Skin:  Intact without significant lesions or rashes. No jaundice. Psych:  Alert and cooperative. Normal mood and affect.  Imaging Studies: Reviewed  Assessment and Plan:    Cirrhosis: decompensated, CPT score 6, class A, MELD-Na 13 Etiology: Presumed to be secondary to long-term NSAID use.  Acute viral hepatitis panel is negative.  Will perform rest of the secondary liver disease work-up today.  HIV negative, low  ferritin  Portal hypertension:  1.Volume status: Ascites: None, 2gm sodium diet  2.PSE: None 3.Anemia/Thrombocytopenia/Coagulopathy: Has thrombocytopenia and mild coagulopathy, severe iron deficiency anemia, received IV iron.  Anemia has resolved, no evidence of iron deficiency. 4.Varices: Large esophageal varices and portal hypertensive gastropathy, status post EGD on 02/17/2019, variceal ligation x4.  Continue nadolol 20 mg daily, heart rate at goal.  Refills provided and reviewed side effects of nadolol.  Recommend surveillance EGD 5.HRS: None 6.HCC screening: [place] 02/2019 with no liver lesions, AFP normal, CT in 09/2018 with no liver lesions other than cirrhosis.  Recheck ultrasound and AFP in 6 months  Health maintenance: 1.Hepatitis A/B vaccine status: Immune to hepatitis A, recommend hepatitis B antibody at the [place] 2.Influenza vaccine: Recommend annual influenza vaccine 3.Pneumonia vaccine: Unknown 4.Colonoscopy:  In 2018/2019 5.Vitamin D levels: Unknown 6.Abstinence from ETOH:n/a 7.Avoid NSAIDs 8.2D Echo: EF 55 to 60%, normal right ventricular systolic function and no evidence of pulmonary hypertension 9. Hemoglobin A1c 6.2  Evaluate for liver transplant: no due to low meld   Follow up in 3 months   Kirsten Repress, MD

## 2019-05-22 ENCOUNTER — Other Ambulatory Visit
Admission: RE | Admit: 2019-05-22 | Discharge: 2019-05-22 | Disposition: A | Discharge status: Home / Self Care | Payer: Medicare HMO | Source: Ambulatory Visit | Attending: Gastroenterology | Admitting: Gastroenterology

## 2019-05-22 DIAGNOSIS — Z20828 Contact with and (suspected) exposure to other viral communicable diseases: Secondary | ICD-10-CM | POA: Insufficient documentation

## 2019-05-22 DIAGNOSIS — Z01812 Encounter for preprocedural laboratory examination: Secondary | ICD-10-CM | POA: Insufficient documentation

## 2019-05-23 LAB — SARS CORONAVIRUS 2 (TAT 6-24 HRS): SARS Coronavirus 2: NEGATIVE

## 2019-05-25 ENCOUNTER — Encounter
Admission: RE | Disposition: A | Discharge status: Home / Self Care | Payer: Self-pay | Source: Home / Self Care | Attending: Gastroenterology

## 2019-05-25 ENCOUNTER — Ambulatory Visit: Payer: Medicare HMO | Admitting: Registered Nurse

## 2019-05-25 ENCOUNTER — Ambulatory Visit
Admission: RE | Admit: 2019-05-25 | Discharge: 2019-05-25 | Disposition: A | Discharge status: Home / Self Care | Payer: Medicare HMO | Attending: Gastroenterology | Admitting: Gastroenterology

## 2019-05-25 ENCOUNTER — Encounter: Payer: Self-pay | Admitting: *Deleted

## 2019-05-25 ENCOUNTER — Other Ambulatory Visit: Payer: Self-pay

## 2019-05-25 DIAGNOSIS — D500 Iron deficiency anemia secondary to blood loss (chronic): Secondary | ICD-10-CM | POA: Insufficient documentation

## 2019-05-25 DIAGNOSIS — Z79899 Other long term (current) drug therapy: Secondary | ICD-10-CM | POA: Insufficient documentation

## 2019-05-25 DIAGNOSIS — I8510 Secondary esophageal varices without bleeding: Secondary | ICD-10-CM

## 2019-05-25 DIAGNOSIS — Z885 Allergy status to narcotic agent status: Secondary | ICD-10-CM | POA: Insufficient documentation

## 2019-05-25 DIAGNOSIS — K766 Portal hypertension: Secondary | ICD-10-CM

## 2019-05-25 DIAGNOSIS — Z96659 Presence of unspecified artificial knee joint: Secondary | ICD-10-CM | POA: Insufficient documentation

## 2019-05-25 DIAGNOSIS — I8500 Esophageal varices without bleeding: Secondary | ICD-10-CM

## 2019-05-25 DIAGNOSIS — I10 Essential (primary) hypertension: Secondary | ICD-10-CM | POA: Diagnosis not present

## 2019-05-25 DIAGNOSIS — K3189 Other diseases of stomach and duodenum: Secondary | ICD-10-CM

## 2019-05-25 HISTORY — PX: ESOPHAGOGASTRODUODENOSCOPY (EGD) WITH PROPOFOL: SHX5813

## 2019-05-25 SURGERY — ESOPHAGOGASTRODUODENOSCOPY (EGD) WITH PROPOFOL
Anesthesia: General

## 2019-05-25 MED ORDER — PROPOFOL 500 MG/50ML IV EMUL
INTRAVENOUS | Status: DC | PRN
  Administered 2019-05-25 (×2): 20 mg via INTRAVENOUS
  Administered 2019-05-25: 40 mg via INTRAVENOUS

## 2019-05-25 MED ORDER — PROPOFOL 500 MG/50ML IV EMUL
INTRAVENOUS | Status: AC
  Filled 2019-05-25: qty 50

## 2019-05-25 MED ORDER — ESOMEPRAZOLE MAGNESIUM 40 MG PO CPDR: 40.0000 mg | DELAYED_RELEASE_CAPSULE | Freq: Two times a day (BID) | ORAL | 0 refills | Status: DC

## 2019-05-25 MED ORDER — PROPOFOL 500 MG/50ML IV EMUL
INTRAVENOUS | Status: DC | PRN
  Administered 2019-05-25: 120 ug/kg/min via INTRAVENOUS

## 2019-05-25 MED ORDER — SODIUM CHLORIDE 0.9 % IV SOLN
INTRAVENOUS | Status: DC
  Administered 2019-05-25: 09:00:00 via INTRAVENOUS

## 2019-05-25 MED ORDER — LIDOCAINE HCL (PF) 2 % IJ SOLN
INTRAMUSCULAR | Status: AC
  Filled 2019-05-25: qty 10

## 2019-05-25 MED ORDER — LIDOCAINE HCL (CARDIAC) PF 100 MG/5ML IV SOSY
PREFILLED_SYRINGE | INTRAVENOUS | Status: DC | PRN
  Administered 2019-05-25: 20 mg via INTRAVENOUS
  Administered 2019-05-25: 60 mg via INTRAVENOUS
  Administered 2019-05-25: 20 mg via INTRAVENOUS

## 2019-05-25 NOTE — Anesthesia Preprocedure Evaluation (Signed)
Anesthesia Evaluation  Patient identified by MRN, date of birth, ID band Patient awake    Reviewed: Allergy & Precautions, H&P , NPO status , Patient's Chart, lab work & pertinent test results, reviewed documented beta blocker date and time   Airway Mallampati: II   Neck ROM: full    Dental  (+) Poor Dentition   Pulmonary neg pulmonary ROS,    Pulmonary exam normal        Cardiovascular Exercise Tolerance: Good hypertension, On Medications negative cardio ROS Normal cardiovascular exam Rhythm:regular Rate:Normal     Neuro/Psych negative neurological ROS  negative psych ROS   GI/Hepatic negative GI ROS, Neg liver ROS,   Endo/Other  negative endocrine ROS  Renal/GU negative Renal ROS  negative genitourinary   Musculoskeletal   Abdominal   Peds  Hematology negative hematology ROS (+) Blood dyscrasia, anemia ,   Anesthesia Other Findings Past Medical History: No date: Hypertension 03/02/2019: Iron deficiency anemia due to chronic blood loss No date: Irritable bowel Past Surgical History: 02/17/2019: ESOPHAGOGASTRODUODENOSCOPY; N/A     Comment:  Procedure: ESOPHAGOGASTRODUODENOSCOPY (EGD);  Surgeon:               Lin Landsman, MD;  Location: [facility] ENDOSCOPY;                Service: Gastroenterology;  Laterality: N/A; No date: SHOULDER ARTHROTOMY No date: TOTAL KNEE ARTHROPLASTY BMI    Body Mass Index: 29.58 kg/m     Reproductive/Obstetrics negative OB ROS                             Anesthesia Physical Anesthesia Plan  ASA: III  Anesthesia Plan: General   Post-op Pain Management:    Induction:   PONV Risk Score and Plan:   Airway Management Planned:   Additional Equipment:   Intra-op Plan:   Post-operative Plan:   Informed Consent: I have reviewed the patients History and Physical, chart, labs and discussed the procedure including the risks, benefits and  alternatives for the proposed anesthesia with the patient or authorized representative who has indicated his/her understanding and acceptance.     Dental Advisory Given  Plan Discussed with: CRNA  Anesthesia Plan Comments:         Anesthesia Quick Evaluation

## 2019-05-25 NOTE — H&P (Signed)
Kirsten Darby, MD [address]  [place]  [place], [place] [address]  Main: [phone]  Fax: [phone] Pager: [phone]  Primary Care Physician:  Center, [facility] Primary Gastroenterologist:  Dr. Cephas Ware  Pre-Procedure History & Physical: HPI:  [name] is a 63 y.o. female is here for an endoscopy.   Past Medical History:  Diagnosis Date  . Hypertension   . Iron deficiency anemia due to chronic blood loss 03/02/2019  . Irritable bowel     Past Surgical History:  Procedure Laterality Date  . ESOPHAGOGASTRODUODENOSCOPY N/A 02/17/2019   Procedure: ESOPHAGOGASTRODUODENOSCOPY (EGD);  Surgeon: Lin Landsman, MD;  Location: [facility] ENDOSCOPY;  Service: Gastroenterology;  Laterality: N/A;  . SHOULDER ARTHROTOMY    . TOTAL KNEE ARTHROPLASTY      Prior to Admission medications   Medication Sig Start Date End Date Taking? Authorizing Provider  CALCIUM PO Take 1 tablet by mouth 2 (two) times daily.   Yes [provider]  nadolol (CORGARD) 20 MG tablet Take 1 tablet (20 mg total) by mouth daily. 05/21/19 08/19/19 Yes , Tally Due, MD  ondansetron (ZOFRAN ODT) 4 MG disintegrating tablet Take 1 tablet (4 mg total) by mouth every 8 (eight) hours as needed for nausea or vomiting. 02/21/19  Yes Gouru, Aruna, MD  POTASSIUM PO Take 1 tablet by mouth daily.   Yes [provider]  traMADol (ULTRAM) 50 MG tablet Take 1 tablet (50 mg total) by mouth daily as needed for moderate pain or severe pain. 04/13/19  Yes Cammie Sickle, MD  esomeprazole (NEXIUM) 40 MG capsule Take 1 capsule (40 mg total) by mouth 2 (two) times daily before a meal. Patient not taking: Reported on 05/21/2019 02/21/19   Nicholes Mango, MD  PRESCRIPTION MEDICATION Take 1 tablet by mouth 2 (two) times daily.    [provider]    Allergies as of 05/22/2019 - Review Complete 05/21/2019  Allergen Reaction Noted  . Hydrocodone-acetaminophen Nausea And  Vomiting 04/28/2012  . Codeine Nausea And Vomiting 05/07/2017  . Lactose intolerance (gi)  05/07/2017    Family History  Problem Relation Age of Onset  . Osteoarthritis Mother   . Cancer Father        cirrhosis; alcoholic  . Cirrhosis Sister        alcoholic  . Cirrhosis Brother        alcoholic    Social History   Socioeconomic History  . Marital status: Married    Spouse name: Not on file  . Number of children: Not on file  . Years of education: Not on file  . Highest education level: Not on file  Occupational History  . Not on file  Social Needs  . Financial resource strain: Not on file  . Food insecurity    Worry: Not on file    Inability: Not on file  . Transportation needs    Medical: Not on file    Non-medical: Not on file  Tobacco Use  . Smoking status: Never Smoker  . Smokeless tobacco: Never Used  Substance and Sexual Activity  . Alcohol use: No  . Drug use: No  . Sexual activity: Not on file  Lifestyle  . Physical activity    Days per week: Not on file    Minutes per session: Not on file  . Stress: Not on file  Relationships  . Social Herbalist on phone: Not on file    Gets together:  Not on file    Attends religious service: Not on file    Active member of club or organization: Not on file    Attends meetings of clubs or organizations: Not on file    Relationship status: Not on file  . Intimate partner violence    Fear of current or ex partner: Not on file    Emotionally abused: Not on file    Physically abused: Not on file    Forced sexual activity: Not on file  Other Topics Concern  . Not on file  Social History Narrative   Lives in [place] with husband; never smoked; no current alcohol; prior ocassional alcohol. [facility]    Review of Systems: See HPI, otherwise negative ROS  Physical Exam: BP (!) 149/95   Pulse (!) 56   Temp 97.6 F (36.4 C) (Temporal)   Resp 18   Ht 5\' 3"  (1.6 m)   Wt 75.8 kg   SpO2 100%    BMI 29.58 kg/m  General:   Alert,  pleasant and cooperative in NAD Head:  Normocephalic and atraumatic. Neck:  Supple; no masses or thyromegaly. Lungs:  Clear throughout to auscultation.    Heart:  Regular rate and rhythm. Abdomen:  Soft, nontender and nondistended. Normal bowel sounds, without guarding, and without rebound.   Neurologic:  Alert and  oriented x4;  grossly normal neurologically.  Impression/Plan: [name] is here for an endoscopy to be performed for variceal surveillance and possible banding, h/o cirrhosis  Risks, benefits, limitations, and alternatives regarding  endoscopy have been reviewed with the patient.  Questions have been answered.  All parties agreeable.   [name], MD  05/25/2019, 8:36 AM

## 2019-05-25 NOTE — Op Note (Signed)
[facility] Gastroenterology Patient Name: [name] Procedure Date: 05/25/2019 8:36 AM MRN: [id number] Account #: [id number] Date of Birth: May 19, 1956 Admit Type: Outpatient Age: 63 Room: [facility] ENDO ROOM 1 Gender: Female Note Status: Finalized Procedure:             Upper GI endoscopy Indications:           Esophageal varices, Follow-up of esophageal varices,                         For therapy of esophageal varices Providers:             Lin Landsman MD, MD Medicines:             Monitored Anesthesia Care Complications:         No immediate complications. Estimated blood loss: None. Procedure:             Pre-Anesthesia Assessment:                        - Prior to the procedure, a History and Physical was                         performed, and patient medications and allergies were                         reviewed. The patient is competent. The risks and                         benefits of the procedure and the sedation options and                         risks were discussed with the patient. All questions                         were answered and informed consent was obtained.                         Patient identification and proposed procedure were                         verified by the physician, the nurse, the                         anesthesiologist, the anesthetist and the technician                         in the pre-procedure area in the procedure room in the                         endoscopy suite. Mental Status Examination: alert and                         oriented. Airway Examination: normal oropharyngeal                         airway and neck mobility. Respiratory Examination:                         clear to auscultation. CV  Examination: normal.                         Prophylactic Antibiotics: The patient does not require                         prophylactic antibiotics. Prior Anticoagulants: The                         patient has  taken no previous anticoagulant or                         antiplatelet agents. ASA Grade Assessment: III - A                         patient with severe systemic disease. After reviewing                         the risks and benefits, the patient was deemed in                         satisfactory condition to undergo the procedure. The                         anesthesia plan was to use monitored anesthesia care                         (MAC). Immediately prior to administration of                         medications, the patient was re-assessed for adequacy                         to receive sedatives. The heart rate, respiratory                         rate, oxygen saturations, blood pressure, adequacy of                         pulmonary ventilation, and response to care were                         monitored throughout the procedure. The physical                         status of the patient was re-assessed after the                         procedure.                        After obtaining informed consent, the endoscope was                         passed under direct vision. Throughout the procedure,                         the patient's blood pressure, pulse, and oxygen  saturations were monitored continuously. The Endoscope                         was introduced through the mouth, and advanced to the                         second part of duodenum. The upper GI endoscopy was                         accomplished without difficulty. The patient tolerated                         the procedure well. Findings:      The duodenal bulb and second portion of the duodenum were normal.      Severe, diffuse portal hypertensive gastropathy was found in the entire       examined stomach.      Four columns of large (> 5 mm) varices with no bleeding and no stigmata       of recent bleeding were found in the middle third of the esophagus and       in the lower third of the  esophagus, 35 cm from the incisors. Red wale       signs were present. Scarring from prior treatment was visible. Evidence       of partial eradication was visible. Four bands were successfully placed       with incomplete eradication of varices. There was no bleeding during and       at the end of the procedure. Impression:            - Normal duodenal bulb and second portion of the                         duodenum.                        - Portal hypertensive gastropathy.                        - Large (> 5 mm) esophageal varices with no bleeding                         and no stigmata of recent bleeding. Incompletely                         eradicated. Banded.                        - No specimens collected. Recommendation:        - Discharge patient to home (with escort).                        - Clear liquid diet and low sodium diet today.                        - Continue present medications.                        - Use a proton pump inhibitor PO BID for 2 weeks.                        -  Continue nadolol                        - Repeat upper endoscopy in 1 month for endoscopic                         band ligation.                        - Return to my office as previously scheduled. Procedure Code(s):     --- Professional ---                        [phone], Esophagogastroduodenoscopy, flexible,                         transoral; with band ligation of esophageal/gastric                         varices Diagnosis Code(s):     --- Professional ---                        K76.6, Portal hypertension                        K31.89, Other diseases of stomach and duodenum                        I85.00, Esophageal varices without bleeding CPT copyright 2019 American Medical Association. All rights reserved. The codes documented in this report are preliminary and upon coder review may  be revised to meet current compliance requirements. Dr. Ulyess Mort Lin Landsman MD, MD 05/25/2019  9:00:42 AM This report has been signed electronically. Number of Addenda: 0 Note Initiated On: 05/25/2019 8:36 AM Estimated Blood Loss:  Estimated blood loss: none.      [facility]

## 2019-05-25 NOTE — Anesthesia Post-op Follow-up Note (Signed)
Anesthesia QCDR form completed.        

## 2019-05-25 NOTE — Transfer of Care (Signed)
Immediate Anesthesia Transfer of Care Note  Patient: [name]  Procedure(s) Performed: ESOPHAGOGASTRODUODENOSCOPY (EGD) WITH PROPOFOL (N/A )  Patient Location: Endoscopy Unit  Anesthesia Type:General  Level of Consciousness: sedated  Airway & Oxygen Therapy: Patient Spontanous Breathing and Patient connected to nasal cannula oxygen  Post-op Assessment: Report given to RN and Post -op Vital signs reviewed and stable  Post vital signs: Reviewed and stable  Last Vitals:  Vitals Value Taken Time  BP    Temp    Pulse    Resp    SpO2      Last Pain:  Vitals:   05/25/19 0820  TempSrc: Temporal  PainSc: 0-No pain         Complications: No apparent anesthesia complications

## 2019-06-05 NOTE — Anesthesia Postprocedure Evaluation (Signed)
Anesthesia Post Note  Patient: [name]  Procedure(s) Performed: ESOPHAGOGASTRODUODENOSCOPY (EGD) WITH PROPOFOL (N/A )  Patient location during evaluation: PACU Anesthesia Type: General Level of consciousness: awake and alert Pain management: pain level controlled Vital Signs Assessment: post-procedure vital signs reviewed and stable Respiratory status: spontaneous breathing, nonlabored ventilation, respiratory function stable and patient connected to nasal cannula oxygen Cardiovascular status: blood pressure returned to baseline and stable Postop Assessment: no apparent nausea or vomiting Anesthetic complications: no     Last Vitals:  Vitals:   05/25/19 0912 05/25/19 0922  BP: (!) 158/94 (!) 179/100  Pulse: (!) 53 (!) 54  Resp: 19 15  Temp:    SpO2: 100% 100%    Last Pain:  Vitals:   05/26/19 0847  TempSrc:   PainSc: 3                  Molli Barrows

## 2019-07-12 NOTE — Progress Notes (Deleted)
Called patient no answer left message  

## 2019-07-13 ENCOUNTER — Inpatient Hospital Stay: Payer: Non-veteran care

## 2019-07-13 ENCOUNTER — Inpatient Hospital Stay: Payer: Non-veteran care | Admitting: Internal Medicine

## 2019-08-08 DIAGNOSIS — Z20828 Contact with and (suspected) exposure to other viral communicable diseases: Secondary | ICD-10-CM | POA: Diagnosis not present

## 2019-08-10 ENCOUNTER — Inpatient Hospital Stay: Payer: Medicare HMO | Admitting: Internal Medicine

## 2019-08-10 ENCOUNTER — Inpatient Hospital Stay: Payer: Medicare HMO

## 2019-10-04 ENCOUNTER — Ambulatory Visit: Payer: Medicare HMO | Attending: Internal Medicine

## 2019-10-04 DIAGNOSIS — Z23 Encounter for immunization: Secondary | ICD-10-CM

## 2019-10-04 NOTE — Progress Notes (Signed)
   Covid-19 Vaccination Clinic  Name:  SURIAH PERAGINE    MRN: [id number] DOB: 04/30/1956  10/04/2019  Ms. Scarbrough was observed post Covid-19 immunization for 15 minutes without incident. She was provided with Vaccine Information Sheet and instruction to access the V-Safe system.   Ms. Mcmurphy was instructed to call 911 with any severe reactions post vaccine: Marland Kitchen Difficulty breathing  . Swelling of face and throat  . A fast heartbeat  . A bad rash all over body  . Dizziness and weakness   Immunizations Administered    Name Date Dose VIS Date Route   Pfizer COVID-19 Vaccine 10/04/2019  9:23 AM 0.3 mL 06/15/2019 Intramuscular   Manufacturer: [employer], [employer]   Lot: [id number]   NDC: [address]-8

## 2019-10-29 ENCOUNTER — Ambulatory Visit: Payer: Medicare HMO

## 2019-12-04 DIAGNOSIS — Z20822 Contact with and (suspected) exposure to covid-19: Secondary | ICD-10-CM | POA: Diagnosis not present

## 2020-02-13 ENCOUNTER — Encounter: Payer: Self-pay | Admitting: Emergency Medicine

## 2020-02-13 ENCOUNTER — Other Ambulatory Visit: Payer: Self-pay

## 2020-02-13 ENCOUNTER — Emergency Department
Admission: EM | Admit: 2020-02-13 | Discharge: 2020-02-13 | Disposition: A | Discharge status: Home / Self Care | Payer: No Typology Code available for payment source | Attending: Emergency Medicine | Admitting: Emergency Medicine

## 2020-02-13 DIAGNOSIS — R197 Diarrhea, unspecified: Secondary | ICD-10-CM | POA: Diagnosis not present

## 2020-02-13 DIAGNOSIS — Z5321 Procedure and treatment not carried out due to patient leaving prior to being seen by health care provider: Secondary | ICD-10-CM | POA: Insufficient documentation

## 2020-02-13 LAB — CBC
HCT: 33.7 % — ABNORMAL LOW (ref 36.0–46.0)
Hemoglobin: 11.1 g/dL — ABNORMAL LOW (ref 12.0–15.0)
MCH: 25.5 pg — ABNORMAL LOW (ref 26.0–34.0)
MCHC: 32.9 g/dL (ref 30.0–36.0)
MCV: 77.3 fL — ABNORMAL LOW (ref 80.0–100.0)
Platelets: 68 10*3/uL — ABNORMAL LOW (ref 150–400)
RBC: 4.36 MIL/uL (ref 3.87–5.11)
RDW: 14.6 % (ref 11.5–15.5)
WBC: 3 10*3/uL — ABNORMAL LOW (ref 4.0–10.5)
nRBC: 0 % (ref 0.0–0.2)

## 2020-02-13 LAB — URINALYSIS, COMPLETE (UACMP) WITH MICROSCOPIC
Bilirubin Urine: NEGATIVE
Glucose, UA: NEGATIVE mg/dL
Hgb urine dipstick: NEGATIVE
Ketones, ur: 5 mg/dL — AB
Nitrite: NEGATIVE
Protein, ur: 30 mg/dL — AB
Specific Gravity, Urine: 1.032 — ABNORMAL HIGH (ref 1.005–1.030)
pH: 5 (ref 5.0–8.0)

## 2020-02-13 LAB — COMPREHENSIVE METABOLIC PANEL
ALT: 24 U/L (ref 0–44)
AST: 39 U/L (ref 15–41)
Albumin: 3.9 g/dL (ref 3.5–5.0)
Alkaline Phosphatase: 66 U/L (ref 38–126)
Anion gap: 7 (ref 5–15)
BUN: 23 mg/dL (ref 8–23)
CO2: 23 mmol/L (ref 22–32)
Calcium: 8.6 mg/dL — ABNORMAL LOW (ref 8.9–10.3)
Chloride: 108 mmol/L (ref 98–111)
Creatinine, Ser: 0.92 mg/dL (ref 0.44–1.00)
GFR calc Af Amer: >60 mL/min (ref >60)
GFR calc non Af Amer: >60 mL/min (ref >60)
Glucose, Bld: 184 mg/dL — ABNORMAL HIGH (ref 70–99)
Potassium: 4.6 mmol/L (ref 3.5–5.1)
Sodium: 138 mmol/L (ref 135–145)
Total Bilirubin: 1.2 mg/dL (ref 0.3–1.2)
Total Protein: 7.3 g/dL (ref 6.5–8.1)

## 2020-02-13 LAB — LIPASE, BLOOD: Lipase: 51 U/L (ref 11–51)

## 2020-02-13 LAB — TYPE AND SCREEN
ABO/RH(D): A POS
Antibody Screen: NEGATIVE

## 2020-02-13 NOTE — ED Notes (Signed)
Pt informed registration Koleen Nimrod that she was leaving.

## 2020-02-13 NOTE — ED Triage Notes (Signed)
Pt here for diarrhea X 3 days. Reports it is black. No blood thinners. Ambulatory. Alert and oriented.  Some lower abdominal cramping.

## 2021-02-23 IMAGING — CT CT HEAD WITHOUT CONTRAST
3 series · 15 of 47 positions shown, 18 images · non-contrast
Comparison: 05/07/2017

CLINICAL DATA: Chronic headache

EXAM:
CT HEAD WITHOUT CONTRAST
TECHNIQUE: Contiguous axial images were obtained from the base of the skull
through the vertex without intravenous contrast.

[Series 2: head wo · axial · 0.47mm/px · z∈[-150,-25]mm · 9 of 30 slices shown, 12 images]
[im 3/30  brain]
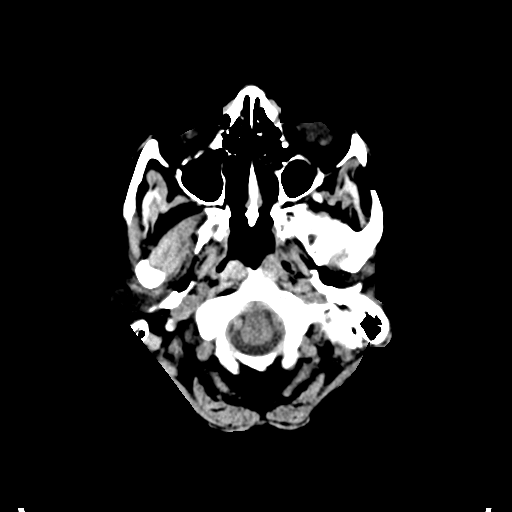
[im 3/30  bone]
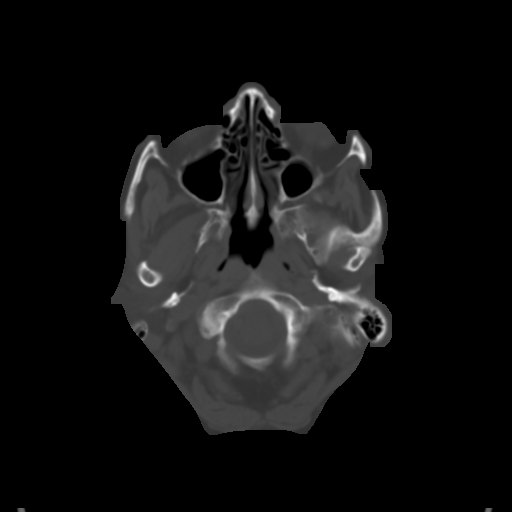
[im 6/30  brain]
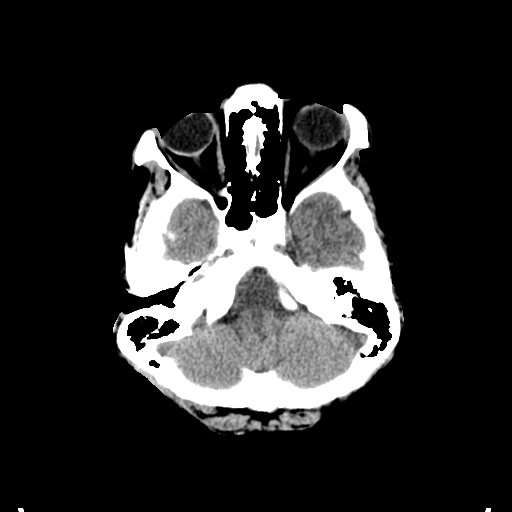
[im 9/30  brain]
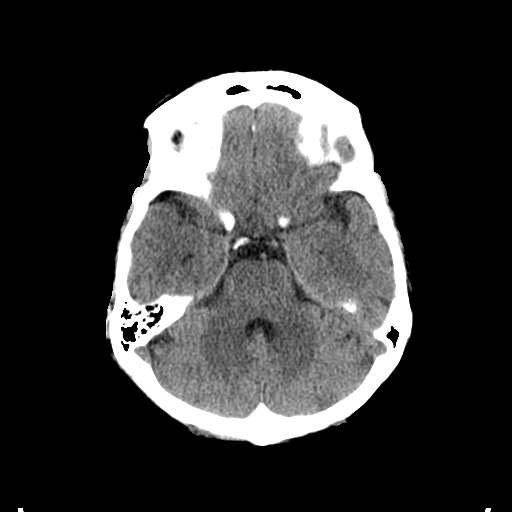
[im 12/30  brain]
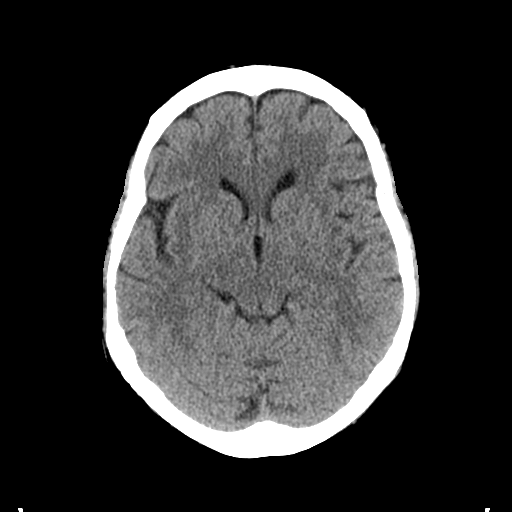
[im 16/30  brain]
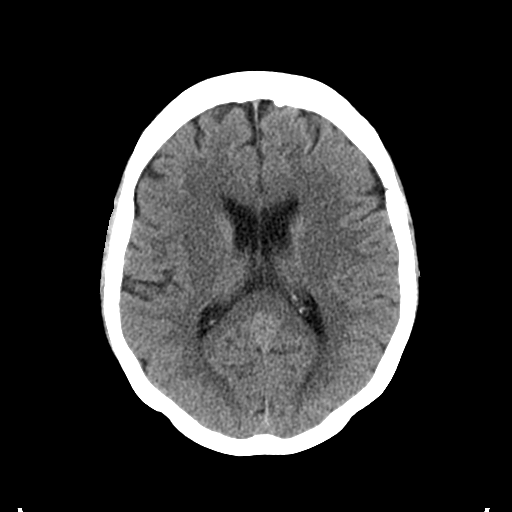
[im 16/30  bone]
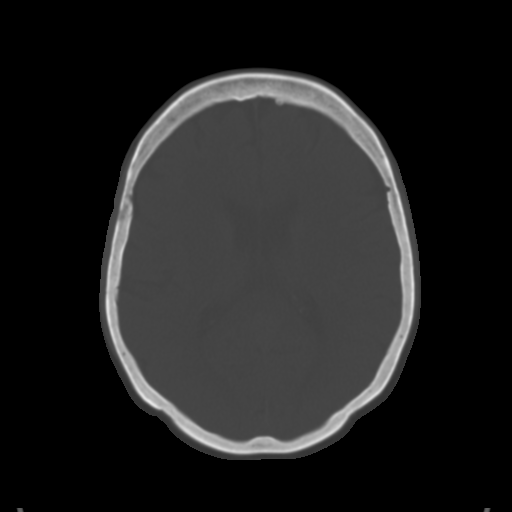
[im 19/30  brain]
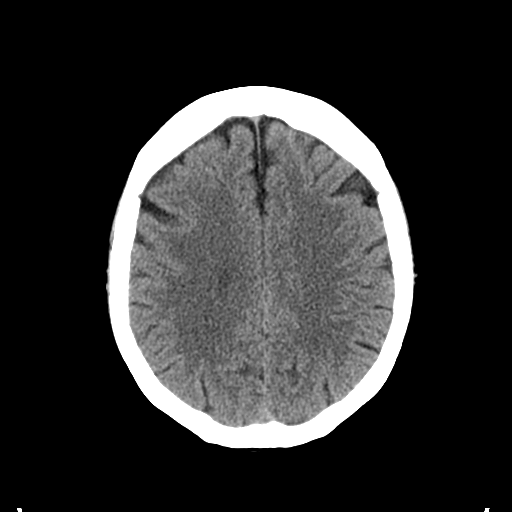
[im 22/30  brain]
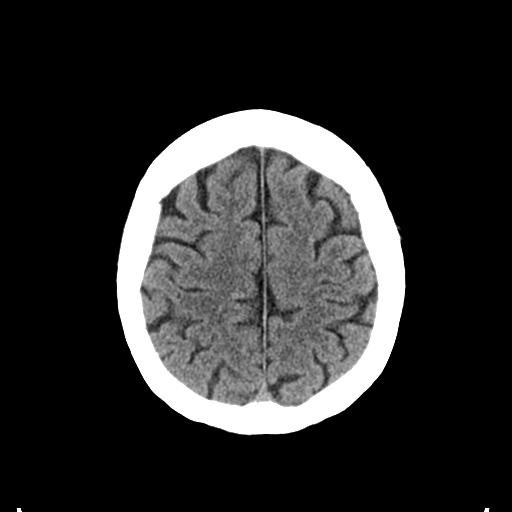
[im 25/30  brain]
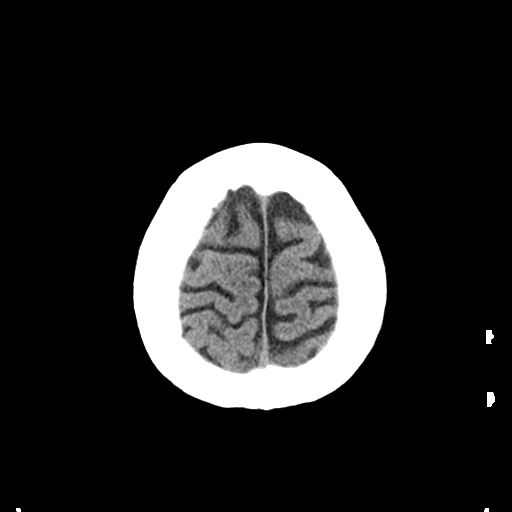
[im 28/30  brain]
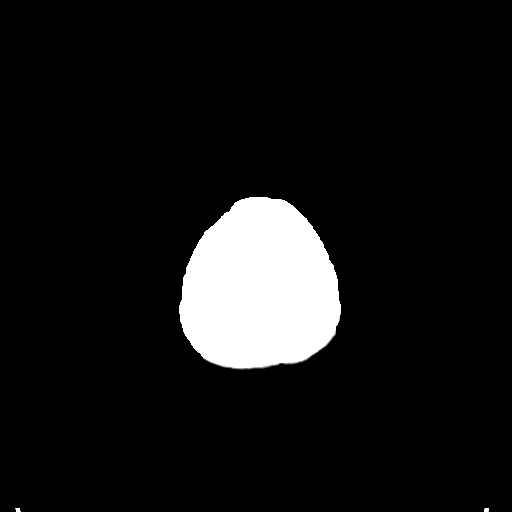
[im 28/30  bone]
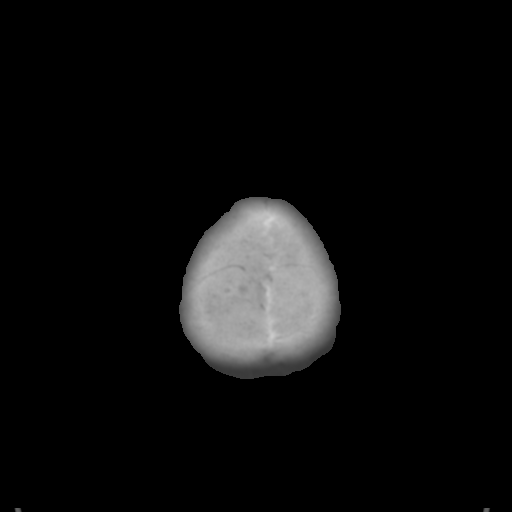

[Series 4: coronal soft tissue · coronal · 0.39mm/px · 3 of 63 slices shown]
[im 21/63  brain]
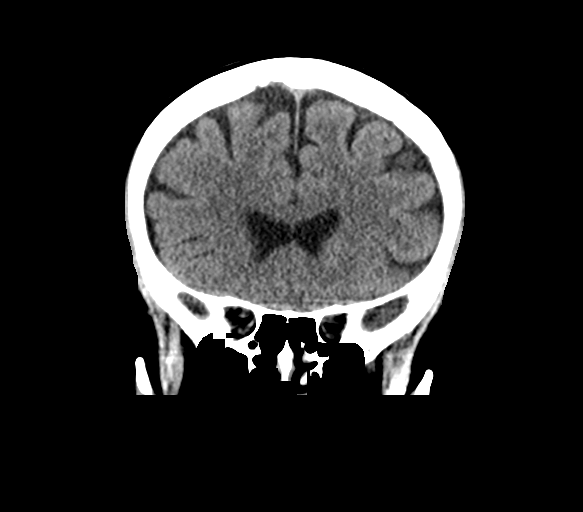
[im 28/63  brain]
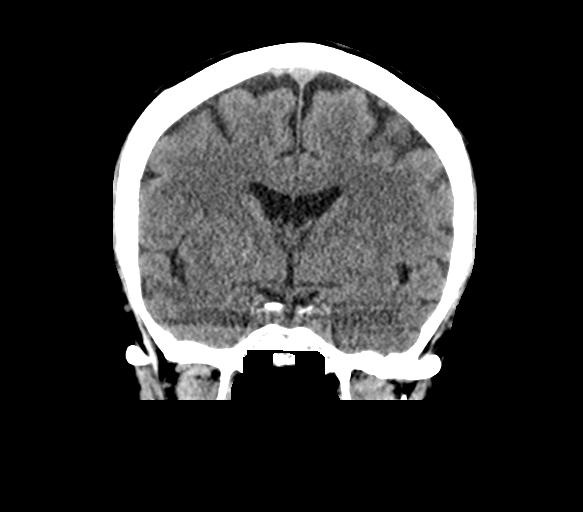
[im 35/63  brain]
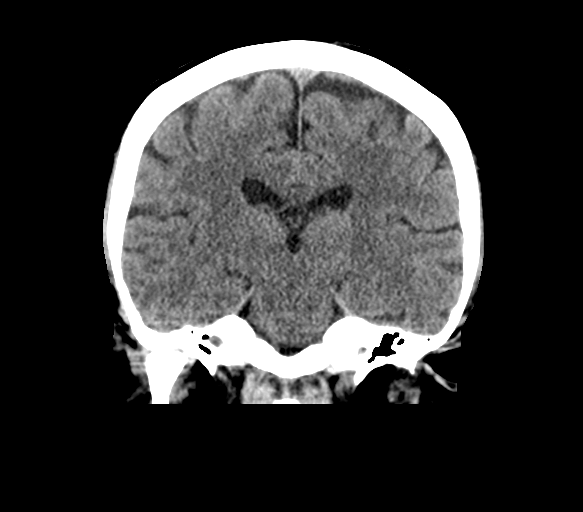

[Series 5: sagittal soft tissue · sagittal · 0.37mm/px · 3 of 54 slices shown]
[im 18/54  brain]
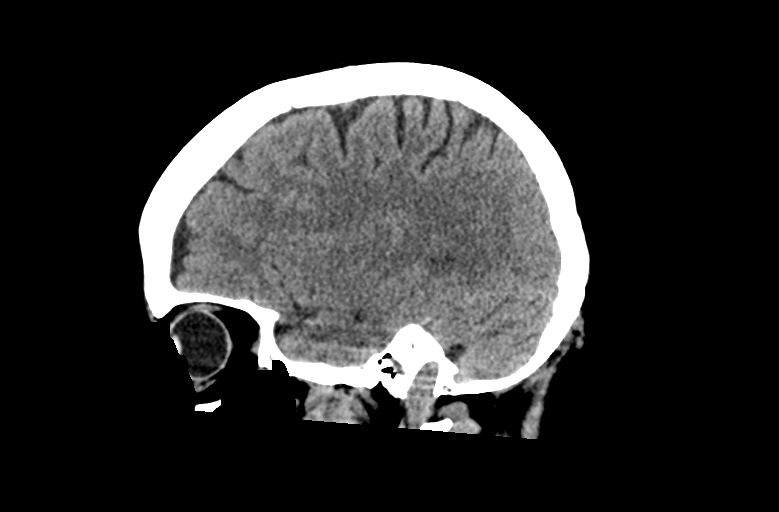
[im 27/54  brain]
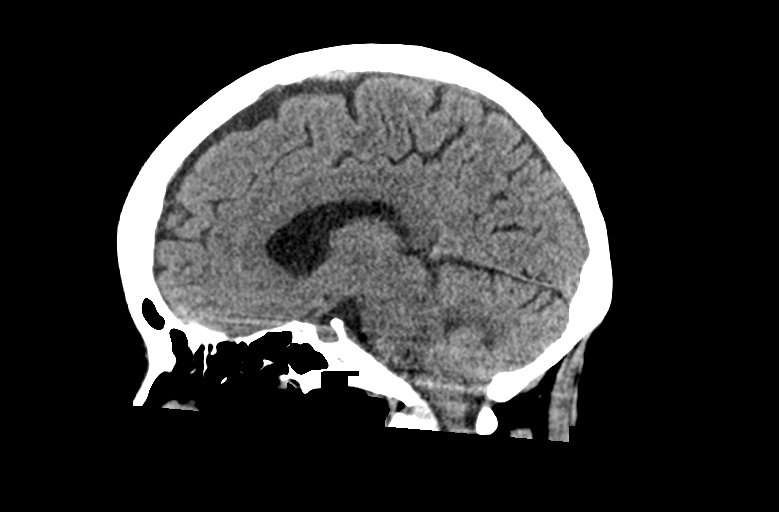
[im 36/54  brain]
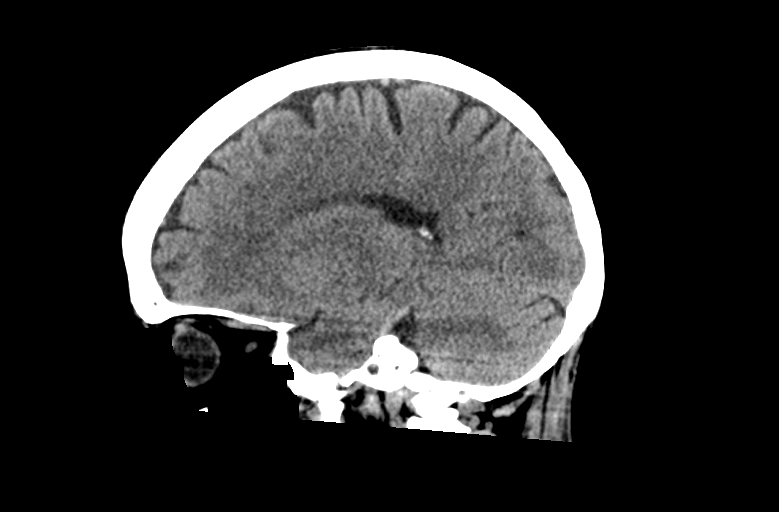

[15 of 47 positions shown; findings below may reference images not displayed]

FINDINGS: Brain: No evidence of acute infarction, hemorrhage, hydrocephalus,
extra-axial collection or mass lesion/mass effect.

Vascular: No hyperdense vessel or unexpected calcification.

Skull: Cranium is intact.

Sinuses/Orbits: No acute finding.

Other: Noncontributory.
IMPRESSION: No acute intracranial pathology.

## 2021-07-24 IMAGING — CR CHEST - 2 VIEW
2 series · 2 of 2 positions shown · non-contrast
Comparison: None.

CLINICAL DATA: Chest pain

EXAM:
CHEST - 2 VIEW

[chest pa]
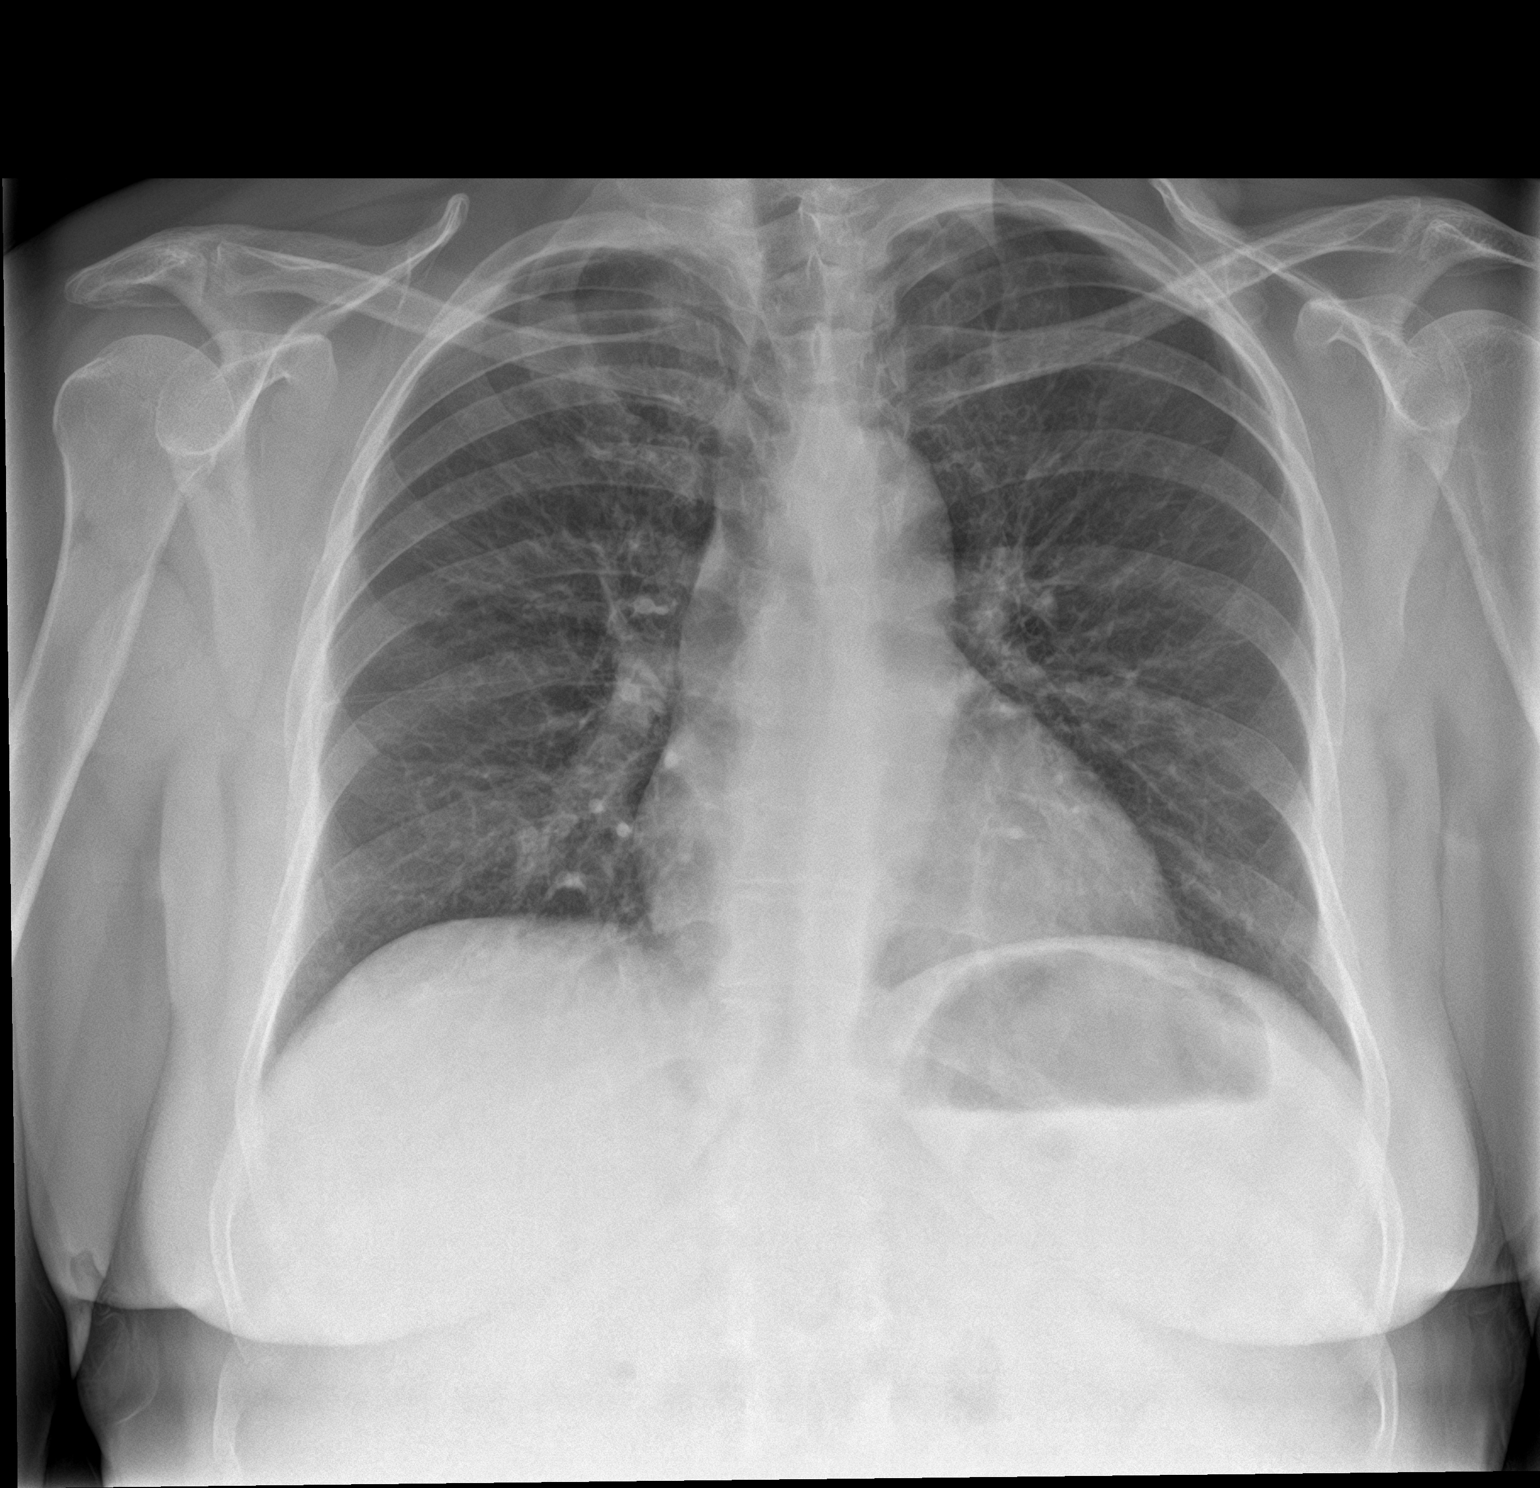

[chest lat]
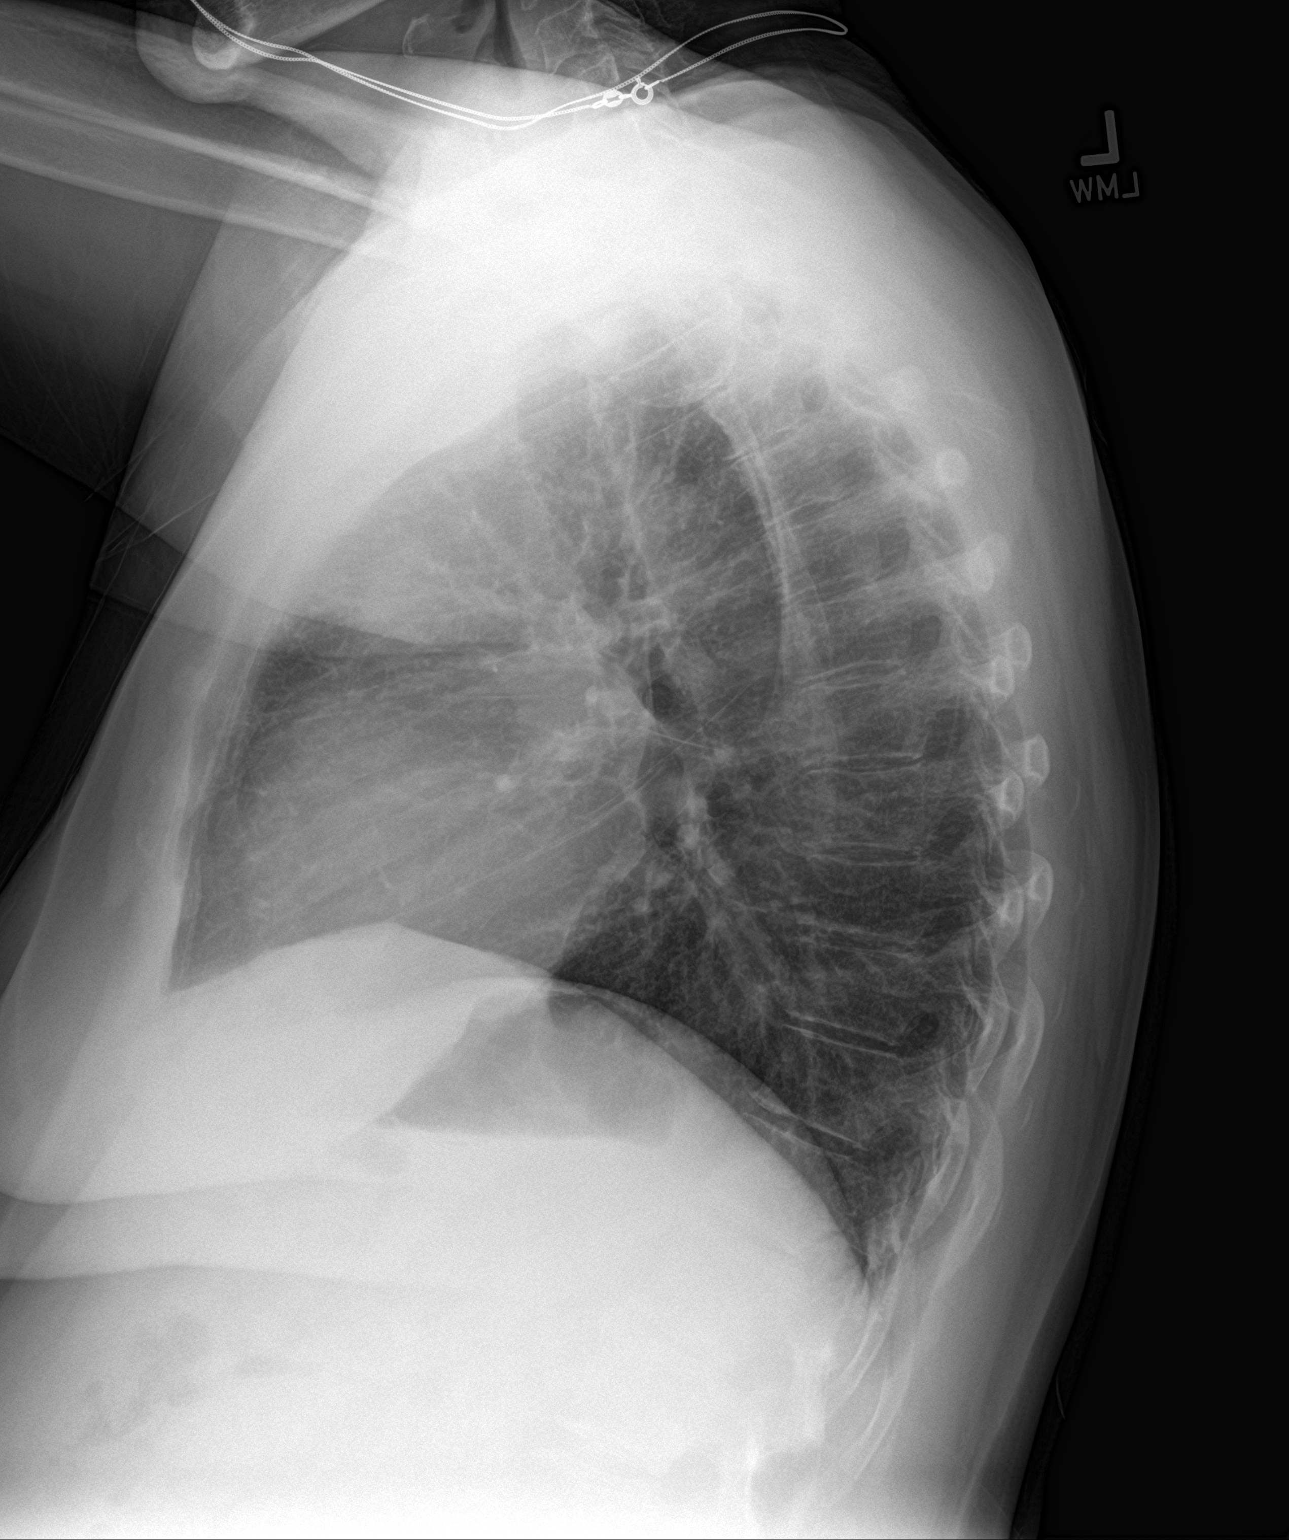

[2 of 2 positions shown; findings below may reference images not displayed]

FINDINGS: The heart size and mediastinal contours are within normal limits.
Both lungs are clear. The visualized skeletal structures are
unremarkable.
IMPRESSION: No acute cardiopulmonary process.

## 2022-04-28 ENCOUNTER — Emergency Department (HOSPITAL_BASED_OUTPATIENT_CLINIC_OR_DEPARTMENT_OTHER)
Admission: EM | Admit: 2022-04-28 | Discharge: 2022-04-28 | Disposition: A | Discharge status: Home / Self Care | Payer: Non-veteran care | Attending: Emergency Medicine | Admitting: Emergency Medicine

## 2022-04-28 ENCOUNTER — Encounter (HOSPITAL_BASED_OUTPATIENT_CLINIC_OR_DEPARTMENT_OTHER): Payer: Self-pay

## 2022-04-28 ENCOUNTER — Encounter: Payer: Self-pay | Admitting: Internal Medicine

## 2022-04-28 ENCOUNTER — Emergency Department (HOSPITAL_BASED_OUTPATIENT_CLINIC_OR_DEPARTMENT_OTHER): Payer: Non-veteran care

## 2022-04-28 ENCOUNTER — Other Ambulatory Visit: Payer: Self-pay

## 2022-04-28 DIAGNOSIS — K458 Other specified abdominal hernia without obstruction or gangrene: Secondary | ICD-10-CM | POA: Insufficient documentation

## 2022-04-28 DIAGNOSIS — R8271 Bacteriuria: Secondary | ICD-10-CM | POA: Insufficient documentation

## 2022-04-28 DIAGNOSIS — I10 Essential (primary) hypertension: Secondary | ICD-10-CM | POA: Insufficient documentation

## 2022-04-28 DIAGNOSIS — R109 Unspecified abdominal pain: Secondary | ICD-10-CM | POA: Diagnosis present

## 2022-04-28 LAB — URINALYSIS, ROUTINE W REFLEX MICROSCOPIC
Bilirubin Urine: NEGATIVE
Glucose, UA: NEGATIVE mg/dL
Ketones, ur: NEGATIVE mg/dL
Nitrite: POSITIVE — AB
Specific Gravity, Urine: 1.026 (ref 1.005–1.030)
pH: 6 (ref 5.0–8.0)

## 2022-04-28 LAB — COMPREHENSIVE METABOLIC PANEL
ALT: 15 U/L (ref 0–44)
AST: 31 U/L (ref 15–41)
Albumin: 4 g/dL (ref 3.5–5.0)
Alkaline Phosphatase: 60 U/L (ref 38–126)
Anion gap: 10 (ref 5–15)
BUN: 18 mg/dL (ref 8–23)
CO2: 22 mmol/L (ref 22–32)
Calcium: 9 mg/dL (ref 8.9–10.3)
Chloride: 104 mmol/L (ref 98–111)
Creatinine, Ser: 0.86 mg/dL (ref 0.44–1.00)
GFR, Estimated: >60 mL/min (ref >60)
Glucose, Bld: 138 mg/dL — ABNORMAL HIGH (ref 70–99)
Potassium: 3.6 mmol/L (ref 3.5–5.1)
Sodium: 136 mmol/L (ref 135–145)
Total Bilirubin: 2 mg/dL — ABNORMAL HIGH (ref 0.3–1.2)
Total Protein: 7.2 g/dL (ref 6.5–8.1)

## 2022-04-28 LAB — CBC
HCT: 33.1 % — ABNORMAL LOW (ref 36.0–46.0)
Hemoglobin: 11.4 g/dL — ABNORMAL LOW (ref 12.0–15.0)
MCH: 27 pg (ref 26.0–34.0)
MCHC: 34.4 g/dL (ref 30.0–36.0)
MCV: 78.3 fL — ABNORMAL LOW (ref 80.0–100.0)
Platelets: 58 10*3/uL — ABNORMAL LOW (ref 150–400)
RBC: 4.23 MIL/uL (ref 3.87–5.11)
RDW: 15.6 % — ABNORMAL HIGH (ref 11.5–15.5)
WBC: 2.4 10*3/uL — ABNORMAL LOW (ref 4.0–10.5)
nRBC: 0 % (ref 0.0–0.2)

## 2022-04-28 LAB — LIPASE, BLOOD: Lipase: 46 U/L (ref 11–51)

## 2022-04-28 MED ORDER — IOHEXOL 300 MG/ML  SOLN
100.0000 mL | Freq: Once | INTRAMUSCULAR | Status: AC | PRN
  Administered 2022-04-28: 80 mL via INTRAVENOUS

## 2022-04-28 NOTE — Discharge Instructions (Addendum)
Dear Ms. Gowens,  You were sen in the ED today for the enlarging abdominal mass. We did a CT scan of your abdomen and were able to confirm you have a hernia. There was also moderate fluid inside your abdomen called ascites that is secondary to your known liver disease (cirrhosis).  During the evaluation, you were also noted to have some white cells in your urine that are associated with urinary infection. You do not have any urinary symtoms so we decided to not treat you for it. However, if you experience any pain with urination, increased urgency, fevers or chills, please call your primary care provider.  It is important that you follow up with:  A liver doctor also called a gastroenterologist. We have attached information of Dr. Michail Sermon at [facility]. Call to make a follow up appointment A general surgeon to discuss hernia repair. We have attached the information of Dr. Brantley Stage. Please call at your earliest convenience.  You should limit the lifting of heay objects to 25lb.  Please take care of yourself, Romana Juniper, MD

## 2022-04-28 NOTE — ED Triage Notes (Signed)
Pt reports she has had a lump beside her belly button for the past couple of months. Pt states the pain has worsened and the lump is getting bigger. Pt believes it could be a hernia.

## 2022-04-28 NOTE — ED Notes (Signed)
Pt discharged home after verbalizing understanding of discharge instructions; nad noted.  Pt reports she has all her belongings.

## 2022-04-28 NOTE — ED Provider Notes (Signed)
[facility] EMERGENCY DEPT Provider Note   CSN: [id number] Arrival date & time: 04/28/22  0909     History  Chief Complaint  Patient presents with   Abdominal Pain    [name] is a 66 y.o. female with a past medical history of htn, decompensated cirrhosis with esophageal varices presenting with complaints of enlarging abdominal mass  This abdominal mass has been present for the past 6 months, but patient feels it has been enlarging over the past 3 weeks. There is no acute pain but there is increased discomfort to the R of the belly button without radiation, nausea, vomiting, fevers, constipation, or changes in urination. There is increased protrusion with the mass with lifting heavy objects and with valsalva type maneuvers. Patient has had to use tramadol in the past when there is acute pain, but has not needed to use any in the past 24 hours.   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    Abdominal Pain      Home Medications Prior to Admission medications   Medication Sig Start Date End Date Taking? Authorizing Provider  CALCIUM PO Take 1 tablet by mouth 2 (two) times daily.    [provider]  esomeprazole (NEXIUM) 40 MG capsule Take 1 capsule (40 mg total) by mouth 2 (two) times daily before a meal. 05/25/19 06/24/19  Vanga, Tally Due, MD  nadolol (CORGARD) 20 MG tablet Take 1 tablet (20 mg total) by mouth daily. 05/21/19 08/19/19  Lin Landsman, MD  ondansetron (ZOFRAN ODT) 4 MG disintegrating tablet Take 1 tablet (4 mg total) by mouth every 8 (eight) hours as needed for nausea or vomiting. 02/21/19   Gouru, Aruna, MD  POTASSIUM PO Take 1 tablet by mouth daily.    [provider]  PRESCRIPTION MEDICATION Take 1 tablet by mouth 2 (two) times daily.    [provider]  promethazine (PHENERGAN) 25 MG tablet Take 25 mg by mouth daily as needed. 04/15/22   [provider]  rizatriptan  (MAXALT) 10 MG tablet Take by mouth. 04/19/22   [provider]  traMADol (ULTRAM) 50 MG tablet Take 1 tablet (50 mg total) by mouth daily as needed for moderate pain or severe pain. 04/13/19   Cammie Sickle, MD  verapamil (CALAN-SR) 120 MG CR tablet Take by mouth. 04/15/22   [provider]      Allergies    Hydrocodone-acetaminophen, Codeine, and Lactose intolerance (gi)    Review of Systems   Review of Systems  Constitutional: Negative.   Respiratory: Negative.    Cardiovascular: Negative.   Gastrointestinal:        Abdominal discomfort. No pain  Endocrine: Negative.   Genitourinary: Negative.   Musculoskeletal: Negative.   Skin: Negative.   Neurological: Negative.   Psychiatric/Behavioral: Negative.      Physical Exam Updated Vital Signs BP (!) 169/94 (BP Location: Left Arm)   Pulse 65   Temp 97.6 F (36.4 C) (Oral)   Resp 18   Ht 5\' 2"  (1.575 m)   Wt 73.5 kg   SpO2 100%   BMI 29.63 kg/m  Physical Exam Constitutional:      Appearance: She is well-developed.  Cardiovascular:     Rate and Rhythm: Normal rate and regular rhythm.     Heart sounds: Normal heart sounds.  Pulmonary:     Effort: Pulmonary effort is normal.     Breath sounds: Normal breath sounds.  Abdominal:     General: Abdomen is protuberant. Bowel sounds are normal.     Tenderness: There is no abdominal tenderness. There is no guarding or rebound. Negative signs include Murphy's sign and McBurney's sign.     Hernia: A hernia is present. Hernia is present in the ventral area.     Comments: R ventral abdominal protrusion, consistent with a hernia that is reducible and protruded with valsalva maneuvers. No pain on palpation  Skin:    General: Skin is warm and dry.  Neurological:     General: No focal deficit present.     Mental Status: She is alert and oriented to person, place, and time.  Psychiatric:        Mood and Affect: Mood normal.        Behavior: Behavior normal.      Lateral to the rectus sheath  ED Results / Procedures / Treatments   Labs (all labs ordered are listed, but only abnormal results are displayed) Labs Reviewed  COMPREHENSIVE METABOLIC PANEL - Abnormal; Notable for the following components:      Result Value   Glucose, Bld 138 (*)    Total Bilirubin 2.0 (*)    All other components within normal limits  CBC - Abnormal; Notable for the following components:   WBC 2.4 (*)    Hemoglobin 11.4 (*)    HCT 33.1 (*)    MCV 78.3 (*)    RDW 15.6 (*)    Platelets 58 (*)    All other components within normal limits  URINALYSIS, ROUTINE W REFLEX MICROSCOPIC - Abnormal; Notable for the following components:   APPearance HAZY (*)    Hgb urine dipstick TRACE (*)    Protein, ur TRACE (*)    Nitrite POSITIVE (*)    Leukocytes,Ua TRACE (*)    Bacteria, UA MANY (*)    All other components within normal limits  LIPASE, BLOOD    EKG None  Radiology No results found.  Procedures Procedures    Medications Ordered in ED Medications  iohexol (OMNIPAQUE) 300 MG/ML solution 100 mL (80 mLs Intravenous Contrast Given 04/28/22 1102)    ED Course/ Medical Decision Making/ A&P                           Medical Decision Making Amount and/or Complexity of Data Reviewed Labs: ordered. Radiology: ordered.  Risk Prescription drug management.   Patient with a history of NSAID associated cirrhosis c/b esophageal varices presenting with worsening abdominal mass  Suspect patient has an abdominal wall hernia that protrudes with valsalva maneuvers and is easily reducible at this time. No systemic signs or symptoms of incarceration or strangulation. Abdominal exam without fluid wave. Given patient's occupation and comorbidities, will obtain CT abdomen and pelvis with contrast for evaluation.  CMP significant for total bili of 2. WBC and Hgb at her baseline.  PLT 58. U/A with nitrites and leukocytes, though patient is not complaining of  dysuria.  Ct scan with small R paraumbilical hernia that contains ascites from known liver disease. Patient is currently nontoxic; no suspicion for SBP at this time.   Patient denies pain on re-evaluation. Explained results of both blood tests and imaging today. Provided patient with information to contact a gastroenterologist for cirrhosis follow up since patient has not had proper follow up. She was also provided with information for follow up with general surgeon as her hernia is likely to continue enlarging given ascites  and known cirrhosis.  Patient was instructed to limit lifting weights to <25lb. She understands these instructions and knows precautionary symptoms for return to ED. Patient is stable and safe for discharge from ED.  Final Clinical Impression(s) / ED Diagnoses Final diagnoses:  Other specified abdominal hernia without obstruction or gangrene  Asymptomatic bacteriuria      Romana Juniper, MD 05/07/22 1558    Elnora Morrison, MD 05/16/22 1534

## 2022-05-11 ENCOUNTER — Encounter: Payer: Self-pay | Admitting: Internal Medicine

## 2022-07-12 ENCOUNTER — Encounter (HOSPITAL_BASED_OUTPATIENT_CLINIC_OR_DEPARTMENT_OTHER): Payer: Self-pay | Admitting: *Deleted

## 2022-07-12 ENCOUNTER — Other Ambulatory Visit: Payer: Self-pay

## 2022-07-12 ENCOUNTER — Emergency Department (HOSPITAL_BASED_OUTPATIENT_CLINIC_OR_DEPARTMENT_OTHER)
Admission: EM | Admit: 2022-07-12 | Discharge: 2022-07-12 | Disposition: A | Discharge status: Home / Self Care | Payer: No Typology Code available for payment source | Attending: Emergency Medicine | Admitting: Emergency Medicine

## 2022-07-12 ENCOUNTER — Emergency Department (HOSPITAL_BASED_OUTPATIENT_CLINIC_OR_DEPARTMENT_OTHER): Payer: No Typology Code available for payment source

## 2022-07-12 DIAGNOSIS — R1030 Lower abdominal pain, unspecified: Secondary | ICD-10-CM | POA: Insufficient documentation

## 2022-07-12 DIAGNOSIS — R051 Acute cough: Secondary | ICD-10-CM

## 2022-07-12 DIAGNOSIS — R059 Cough, unspecified: Secondary | ICD-10-CM | POA: Diagnosis present

## 2022-07-12 DIAGNOSIS — Z20822 Contact with and (suspected) exposure to covid-19: Secondary | ICD-10-CM | POA: Diagnosis not present

## 2022-07-12 DIAGNOSIS — N3001 Acute cystitis with hematuria: Secondary | ICD-10-CM

## 2022-07-12 LAB — CBC
HCT: 34.9 % — ABNORMAL LOW (ref 36.0–46.0)
Hemoglobin: 11.6 g/dL — ABNORMAL LOW (ref 12.0–15.0)
MCH: 26 pg (ref 26.0–34.0)
MCHC: 33.2 g/dL (ref 30.0–36.0)
MCV: 78.1 fL — ABNORMAL LOW (ref 80.0–100.0)
Platelets: 67 10*3/uL — ABNORMAL LOW (ref 150–400)
RBC: 4.47 MIL/uL (ref 3.87–5.11)
RDW: 15.4 % (ref 11.5–15.5)
WBC: 2.5 10*3/uL — ABNORMAL LOW (ref 4.0–10.5)
nRBC: 0 % (ref 0.0–0.2)

## 2022-07-12 LAB — COMPREHENSIVE METABOLIC PANEL
ALT: 13 U/L (ref 0–44)
AST: 32 U/L (ref 15–41)
Albumin: 3.9 g/dL (ref 3.5–5.0)
Alkaline Phosphatase: 57 U/L (ref 38–126)
Anion gap: 11 (ref 5–15)
BUN: 16 mg/dL (ref 8–23)
CO2: 24 mmol/L (ref 22–32)
Calcium: 9 mg/dL (ref 8.9–10.3)
Chloride: 104 mmol/L (ref 98–111)
Creatinine, Ser: 0.87 mg/dL (ref 0.44–1.00)
GFR, Estimated: >60 mL/min (ref >60)
Glucose, Bld: 184 mg/dL — ABNORMAL HIGH (ref 70–99)
Potassium: 4.3 mmol/L (ref 3.5–5.1)
Sodium: 139 mmol/L (ref 135–145)
Total Bilirubin: 2.4 mg/dL — ABNORMAL HIGH (ref 0.3–1.2)
Total Protein: 7.1 g/dL (ref 6.5–8.1)

## 2022-07-12 LAB — URINALYSIS, ROUTINE W REFLEX MICROSCOPIC
Bilirubin Urine: NEGATIVE
Glucose, UA: NEGATIVE mg/dL
Hgb urine dipstick: NEGATIVE
Ketones, ur: NEGATIVE mg/dL
Leukocytes,Ua: NEGATIVE
Nitrite: POSITIVE — AB
Specific Gravity, Urine: 1.022 (ref 1.005–1.030)
pH: 7 (ref 5.0–8.0)

## 2022-07-12 LAB — LIPASE, BLOOD: Lipase: 44 U/L (ref 11–51)

## 2022-07-12 MED ORDER — SODIUM CHLORIDE 0.9 % IV SOLN
1.0000 g | Freq: Once | INTRAVENOUS | Status: AC
  Administered 2022-07-12: 1 g via INTRAVENOUS
  Filled 2022-07-12: qty 10

## 2022-07-12 MED ORDER — IOHEXOL 300 MG/ML  SOLN
80.0000 mL | Freq: Once | INTRAMUSCULAR | Status: AC | PRN
  Administered 2022-07-12: 80 mL via INTRAVENOUS

## 2022-07-12 MED ORDER — MORPHINE SULFATE (PF) 4 MG/ML IV SOLN
4.0000 mg | Freq: Once | INTRAVENOUS | Status: DC
  Filled 2022-07-12: qty 1

## 2022-07-12 MED ORDER — ONDANSETRON HCL 4 MG/2ML IJ SOLN
4.0000 mg | Freq: Once | INTRAMUSCULAR | Status: DC
  Filled 2022-07-12: qty 2

## 2022-07-12 MED ORDER — TRAMADOL HCL 50 MG PO TABS
50.0000 mg | ORAL_TABLET | Freq: Once | ORAL | Status: AC
  Administered 2022-07-12: 50 mg via ORAL
  Filled 2022-07-12: qty 1

## 2022-07-12 MED ORDER — CEPHALEXIN 500 MG PO CAPS: 500.0000 mg | ORAL_CAPSULE | Freq: Two times a day (BID) | ORAL | 0 refills | Status: AC

## 2022-07-12 NOTE — ED Triage Notes (Signed)
Pt has URI and was dx with RSV.  Pt reports soreness from coughing and worsening umbilical hernia. Pt is wearing abdominal binder for support but reports that it is painful when coughing. Pt has had some nausea with this.  LBM this am.

## 2022-07-12 NOTE — Discharge Instructions (Addendum)
Seen today for cough and having some abdominal pain.  You were found to have a urinary tract infection.  The urine for culture but we will start you on antibiotics.  You got your first dose in the ER.  Please start the Keflex tomorrow.  Your CT scan showed some fluid in your hernia but no bowel incarceration.  Per your request you are given information to follow-up with a surgeon for a second opinion regarding hernia repair.  Your cough is likely due to your RSV which can be persistent.  Your lungs are clear and you already had antibiotics.  There is no concern at this time for pneumonia.  Continue with honey and humidifier and supportive care.  Come back to the ER for any new or worsening symptoms.

## 2022-07-12 NOTE — ED Notes (Signed)
Discharge paperwork given and verbally understood. 

## 2022-07-12 NOTE — ED Provider Notes (Signed)
[facility] EMERGENCY DEPT Provider Note   CSN: [id number] Arrival date & time: 07/12/22  1340     History  Chief Complaint  Patient presents with   URI   Hernia    [name] is a 67 y.o. female.  She reports that she had a cough for the past week, she was diagnosed with RSV.  States she was given antibiotics which she is finished and cough medication but states she still coughing a lot.  It hurts a lot when she coughs and she has an umbilical hernia that she states is now hurting more when she coughs.  Has been wearing an abdominal binder but states the pain is still getting somewhat worse in the area.  She denies any pleurisy or shortness of breath, no hemoptysis.  Dizziness or syncope, her pain is only with coughing.  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  She denies fevers or chills or purulent sputum.  She does report suprapubic pressure and pain with frequency seems to worsen at night for the past several days.  URI Presenting symptoms: cough        Home Medications Prior to Admission medications   Medication Sig Start Date End Date Taking? Authorizing Provider  CALCIUM PO Take 1 tablet by mouth 2 (two) times daily.    [provider]  esomeprazole (NEXIUM) 40 MG capsule Take 1 capsule (40 mg total) by mouth 2 (two) times daily before a meal. 05/25/19 06/24/19  Vanga, Tally Due, MD  nadolol (CORGARD) 20 MG tablet Take 1 tablet (20 mg total) by mouth daily. 05/21/19 08/19/19  Lin Landsman, MD  ondansetron (ZOFRAN ODT) 4 MG disintegrating tablet Take 1 tablet (4 mg total) by mouth every 8 (eight) hours as needed for nausea or vomiting. 02/21/19   Gouru, Aruna, MD  POTASSIUM PO Take 1 tablet by mouth daily.    [provider]  PRESCRIPTION MEDICATION Take 1 tablet by mouth 2 (two) times daily.    [provider]  promethazine (PHENERGAN) 25 MG tablet Take 25 mg by mouth daily as needed. 04/15/22    [provider]  rizatriptan (MAXALT) 10 MG tablet Take by mouth. 04/19/22   [provider]  traMADol (ULTRAM) 50 MG tablet Take 1 tablet (50 mg total) by mouth daily as needed for moderate pain or severe pain. 04/13/19   Cammie Sickle, MD  verapamil (CALAN-SR) 120 MG CR tablet Take by mouth. 04/15/22   [provider]      Allergies    Hydrocodone-acetaminophen, Codeine, and Lactose intolerance (gi)    Review of Systems   Review of Systems  Respiratory:  Positive for cough.     Physical Exam Updated Vital Signs BP (!) 150/91   Pulse 77   Temp 97.8 F (36.6 C) (Oral)   Resp 18   Wt 73.5 kg   SpO2 97%   BMI 29.63 kg/m  Physical Exam Vitals and nursing note reviewed.  Constitutional:      General: She is not in acute distress.    Appearance: She is well-developed.  HENT:     Head: Normocephalic and atraumatic.  Eyes:     Conjunctiva/sclera: Conjunctivae normal.  Cardiovascular:     Rate and Rhythm: Normal rate and regular rhythm.     Heart sounds: No murmur heard. Pulmonary:     Effort: Pulmonary effort is normal. No respiratory distress.     Breath sounds: Normal breath sounds.  Abdominal:  General: There is no distension or abdominal bruit.     Palpations: Abdomen is soft.     Comments: Moderate tenderness in the right periumbilical area.  Musculoskeletal:        General: No swelling.     Cervical back: Neck supple.  Skin:    General: Skin is warm and dry.     Capillary Refill: Capillary refill takes less than 2 seconds.  Neurological:     Mental Status: She is alert.  Psychiatric:        Mood and Affect: Mood normal.     ED Results / Procedures / Treatments   Labs (all labs ordered are listed, but only abnormal results are displayed) Labs Reviewed  URINALYSIS, ROUTINE W REFLEX MICROSCOPIC - Abnormal; Notable for the following components:      Result Value   APPearance HAZY (*)    Protein, ur TRACE (*)    Nitrite  POSITIVE (*)    Bacteria, UA MANY (*)    All other components within normal limits  COMPREHENSIVE METABOLIC PANEL - Abnormal; Notable for the following components:   Glucose, Bld 184 (*)    Total Bilirubin 2.4 (*)    All other components within normal limits  CBC - Abnormal; Notable for the following components:   WBC 2.5 (*)    Hemoglobin 11.6 (*)    HCT 34.9 (*)    MCV 78.1 (*)    Platelets 67 (*)    All other components within normal limits  LIPASE, BLOOD    EKG None  Radiology No results found.  Procedures Procedures    Medications Ordered in ED Medications - No data to display  ED Course/ Medical Decision Making/ A&P Clinical Course as of 07/12/22 1634  Mon Jul 12, 2022  1623 Total Bilirubin(!): 2.4 [CB]  1624 Comprehensive metabolic panel(!) [CB]  1610 Platelets(!): 67 [CB]    Clinical Course User Index [CB] Gwenevere Abbot, PA-C                           Medical Decision Making This patient presents to the ED for concern of cough, abdominal pain, this involves an extensive number of treatment options, and is a complaint that carries with it a high risk of complications and morbidity.  The differential diagnosis includes RSV, bronchitis, pneumonia, incarcerated hernia, UTI, other   Co morbidities that complicate the patient evaluation  Liver failure   Additional history obtained:  Additional history obtained from EMR External records from outside source obtained and reviewed including [facility] outpatient visit for bronchitis on 07/07/2022   Lab Tests:  I Ordered, and personally interpreted labs.  The pertinent results include: CBC-values are at patient's baseline including her thrombocytopenia.  CMP shows mild hyperglycemia, UA shows positive nitrite and bacteria with no WBCs or RBCs   Imaging Studies ordered:  I ordered imaging studies including CT abdomen pelvis, I independently visualized and interpreted imaging which showed  no bowel obstruction, no  acute abnormalities, I agree with the radiologist interpretation.  I spoke with the radiologist Dr. Mckinley Jewel verification, she reports there is no bowel located in the hernia only a small amount of ascites.   Cardiac Monitoring: / EKG:  The patient was maintained on a cardiac monitor.  I personally viewed and interpreted the cardiac monitored which showed an underlying rhythm of: Normal sinus rhythm     Problem List / ED Course / Critical interventions / Medication management  Cough-patient  has known RSV, already had a course of antibiotics, lungs are clear, she is not hypoxic or tachycardic not tachypneic.  Likely viral.  Will continue supportive care. Hernia-patient does not have any bowel contained within the hernia.  She is having some pain in this area.  Given tramadol as she states she cannot have morphine and tramadol works well for her she reports.  CT does show sequelae of her liver failure.  She already follows up with specialist at the [facility] for this.  She has been seen by a surgeon for the hernia as well who did not recommend surgical repair and the defect is very small per the CT read.  Patient is requesting referral for second opinion of the hernia because she states it is quite bothersome to her basis. I ordered medication including tramadol for pain Reevaluation of the patient after these medicines showed that the patient improved I have reviewed the patients home medicines and have made adjustments as needed      Amount and/or Complexity of Data Reviewed Labs: ordered. Decision-making details documented in ED Course. Radiology: ordered.  Risk Prescription drug management.           Final Clinical Impression(s) / ED Diagnoses Final diagnoses:  None    Rx / DC Orders ED Discharge Orders     None         Josem Kaufmann 07/12/22 1830    Vanetta Mulders, MD 07/16/22 2034

## 2022-07-14 LAB — URINE CULTURE: Culture: 40000 — AB

## 2022-07-15 ENCOUNTER — Telehealth (HOSPITAL_BASED_OUTPATIENT_CLINIC_OR_DEPARTMENT_OTHER): Payer: Self-pay | Admitting: *Deleted

## 2022-07-15 NOTE — Telephone Encounter (Signed)
Post ED Visit - Positive Culture Follow-up  Culture report reviewed by antimicrobial stewardship pharmacist: [facility] Team []  Elenor Quinones, Pharm.D. []  Heide Guile, Pharm.D., BCPS AQ-ID []  Parks Neptune, Pharm.D., BCPS []  Alycia Rossetti, Pharm.D., BCPS []  [place], Pharm.D., BCPS, AAHIVP []  Legrand Como, Pharm.D., BCPS, AAHIVP []  Salome Arnt, PharmD, BCPS []  Johnnette Gourd, PharmD, BCPS []  Hughes Better, PharmD, BCPS []  Leeroy Cha, PharmD []  Laqueta Linden, PharmD, BCPS []  Albertina Parr, PharmD  [facility] Team []  Leodis Sias, PharmD []  Lindell Spar, PharmD []  Royetta Asal, PharmD []  Graylin Shiver, Rph []  Rema Fendt) Glennon Mac, PharmD []  Arlyn Dunning, PharmD []  Netta Cedars, PharmD []  Dia Sitter, PharmD []  Leone Haven, PharmD []  Gretta Arab, PharmD []  Theodis Shove, PharmD []  Peggyann Juba, PharmD []  Reuel Boom, PharmD   Positive urine culture Treated with Cephalexin, organism sensitive to the same and no further patient follow-up is required at this time.  Luisa Hart, PharmD  Harlon Flor Talley 07/15/2022, 1:04 PM

## 2022-08-30 ENCOUNTER — Emergency Department
Admission: EM | Admit: 2022-08-30 | Discharge: 2022-08-30 | Disposition: A | Discharge status: Home / Self Care | Payer: BC Managed Care – PPO | Attending: Emergency Medicine | Admitting: Emergency Medicine

## 2022-08-30 ENCOUNTER — Other Ambulatory Visit: Payer: Self-pay

## 2022-08-30 ENCOUNTER — Encounter: Payer: Self-pay | Admitting: Emergency Medicine

## 2022-08-30 ENCOUNTER — Emergency Department: Payer: BC Managed Care – PPO

## 2022-08-30 DIAGNOSIS — K7460 Unspecified cirrhosis of liver: Secondary | ICD-10-CM

## 2022-08-30 DIAGNOSIS — K7031 Alcoholic cirrhosis of liver with ascites: Secondary | ICD-10-CM | POA: Diagnosis not present

## 2022-08-30 DIAGNOSIS — K4090 Unilateral inguinal hernia, without obstruction or gangrene, not specified as recurrent: Secondary | ICD-10-CM | POA: Diagnosis present

## 2022-08-30 LAB — CBC
HCT: 35.9 % — ABNORMAL LOW (ref 36.0–46.0)
Hemoglobin: 12.2 g/dL (ref 12.0–15.0)
MCH: 27.2 pg (ref 26.0–34.0)
MCHC: 34 g/dL (ref 30.0–36.0)
MCV: 80.1 fL (ref 80.0–100.0)
Platelets: 70 10*3/uL — ABNORMAL LOW (ref 150–400)
RBC: 4.48 MIL/uL (ref 3.87–5.11)
RDW: 16.8 % — ABNORMAL HIGH (ref 11.5–15.5)
WBC: 2 10*3/uL — ABNORMAL LOW (ref 4.0–10.5)
nRBC: 0 % (ref 0.0–0.2)

## 2022-08-30 LAB — COMPREHENSIVE METABOLIC PANEL
ALT: 16 U/L (ref 0–44)
AST: 37 U/L (ref 15–41)
Albumin: 3.6 g/dL (ref 3.5–5.0)
Alkaline Phosphatase: 76 U/L (ref 38–126)
Anion gap: 6 (ref 5–15)
BUN: 20 mg/dL (ref 8–23)
CO2: 23 mmol/L (ref 22–32)
Calcium: 8.4 mg/dL — ABNORMAL LOW (ref 8.9–10.3)
Chloride: 108 mmol/L (ref 98–111)
Creatinine, Ser: 0.95 mg/dL (ref 0.44–1.00)
GFR, Estimated: >60 mL/min (ref >60)
Glucose, Bld: 134 mg/dL — ABNORMAL HIGH (ref 70–99)
Potassium: 3.8 mmol/L (ref 3.5–5.1)
Sodium: 137 mmol/L (ref 135–145)
Total Bilirubin: 2.4 mg/dL — ABNORMAL HIGH (ref 0.3–1.2)
Total Protein: 7.1 g/dL (ref 6.5–8.1)

## 2022-08-30 LAB — URINALYSIS, ROUTINE W REFLEX MICROSCOPIC
Bilirubin Urine: NEGATIVE
Glucose, UA: NEGATIVE mg/dL
Hgb urine dipstick: NEGATIVE
Ketones, ur: NEGATIVE mg/dL
Leukocytes,Ua: NEGATIVE
Nitrite: NEGATIVE
Protein, ur: NEGATIVE mg/dL
Specific Gravity, Urine: 1.021 (ref 1.005–1.030)
pH: 6 (ref 5.0–8.0)

## 2022-08-30 LAB — LIPASE, BLOOD: Lipase: 49 U/L (ref 11–51)

## 2022-08-30 MED ORDER — IOHEXOL 300 MG/ML  SOLN
100.0000 mL | Freq: Once | INTRAMUSCULAR | Status: AC | PRN
  Administered 2022-08-30: 100 mL via INTRAVENOUS

## 2022-08-30 MED ORDER — LIDOCAINE HCL (PF) 1 % IJ SOLN
10.0000 mL | Freq: Once | INTRAMUSCULAR | Status: AC
  Administered 2022-08-30: 10 mL via INTRADERMAL

## 2022-08-30 NOTE — ED Provider Notes (Signed)
[facility] Provider Note    Event Date/Time   First MD Initiated Contact with Patient 08/30/22 [phone]     (approximate)   History   Hernia   HPI  [name] is a 67 y.o. 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  Patient notes over the last several weeks she has felt very bloated and her abdomen has been distended.  She is wondering if this can be related to her hernia.  She has never had any fluid in her abdomen before     Physical Exam   Triage Vital Signs: ED Triage Vitals  Enc Vitals Group     BP 08/30/22 0857 (!) 173/95     Pulse Rate 08/30/22 0857 66     Resp 08/30/22 0857 18     Temp 08/30/22 0857 98.2 F (36.8 C)     Temp Source 08/30/22 0857 Oral     SpO2 08/30/22 0857 97 %     Weight 08/30/22 0925 73.5 kg (162 lb 0.6 oz)     Height 08/30/22 0925 1.575 m ('5\' 2"'$ )     Head Circumference --      Peak Flow --      Pain Score 08/30/22 0857 6     Pain Loc --      Pain Edu? --      Excl. in [place]? --     Most recent vital signs: Vitals:   08/30/22 0857 08/30/22 1301  BP: (!) 173/95 (!) 168/88  Pulse: 66 68  Resp: 18 18  Temp: 98.2 F (36.8 C) 98 F (36.7 C)  SpO2: 97% 98%     General: Awake, no distress.  CV:  Good peripheral perfusion.  Resp:  Normal effort.  Abd:  Distended abdomen, most suspicious for ascites, nontender, hernia easily reducible.  Positive caput medusa Other:     ED Results / Procedures / Treatments   Labs (all labs ordered are listed, but only abnormal results are displayed) Labs Reviewed  COMPREHENSIVE METABOLIC PANEL - Abnormal; Notable for the following components:      Result Value   Glucose, Bld 134 (*)    Calcium 8.4 (*)    Total Bilirubin 2.4 (*)    All other components within normal limits  CBC - Abnormal; Notable for the following components:   WBC 2.0 (*)    HCT 35.9 (*)     RDW 16.8 (*)    Platelets 70 (*)    All other components within normal limits  URINALYSIS, ROUTINE W REFLEX MICROSCOPIC - Abnormal; Notable for the following components:   Color, Urine YELLOW (*)    APPearance CLEAR (*)    All other components within normal limits  LIPASE, BLOOD     EKG     RADIOLOGY CT abdomen pelvis viewed interpreted by me, consistent with ascites    PROCEDURES:  Critical Care performed:   Procedures   MEDICATIONS ORDERED IN ED: Medications  iohexol (OMNIPAQUE) 300 MG/ML solution 100 mL (100 mLs Intravenous Contrast Given 08/30/22 1123)     IMPRESSION / MDM / ASSESSMENT AND PLAN / ED COURSE  I reviewed the triage vital signs and the nursing notes. Patient's presentation is most consistent with acute presentation with potential threat to life or bodily function.  Patient presents with abdominal distention as detailed above.  Differential is most consistent with ascites related to  cirrhosis, less likely consistent with small bowel obstruction or hernia related  She has not had any ascites before on prior imaging  Will obtain CT abdomen pelvis to confirm ascites  ----------------------------------------- 12:36 PM on 08/30/2022 ----------------------------------------- CT scan is consistent with ascites, discussed aneurysm with patient and the need for vascular surgery follow-up.  IR consultation for paracentesis        FINAL CLINICAL IMPRESSION(S) / ED DIAGNOSES   Final diagnoses:  Cirrhosis of liver with ascites, unspecified hepatic cirrhosis type ([place])     Rx / DC Orders   ED Discharge Orders     None        Note:  This document was prepared using Dragon voice recognition software and may include unintentional dictation errors.   Lavonia Drafts, MD 08/30/22 1322

## 2022-08-30 NOTE — ED Notes (Signed)
Back from [place]  States she feels much better

## 2022-08-30 NOTE — Procedures (Signed)
PROCEDURE SUMMARY:  Successful image-guided paracentesis from the right lower abdomen.  Yielded 5.8 liters of amber fluid.  No immediate complications.  EBL = trace. Patient tolerated well.   Specimen was not sent for labs.  Please see imaging section of Epic for full dictation.   Lura Em PA-C 08/30/2022 3:07 PM

## 2022-08-30 NOTE — ED Triage Notes (Signed)
Patient to ED for hernia pain. States pain has been ongoing x6 months. Patient states it is getting bigger with abd size increasing/getting hard. Been seen at [facility] for same.

## 2022-09-11 ENCOUNTER — Other Ambulatory Visit: Payer: Self-pay

## 2022-09-11 ENCOUNTER — Encounter: Payer: Self-pay | Admitting: Emergency Medicine

## 2022-09-11 ENCOUNTER — Emergency Department
Admission: EM | Admit: 2022-09-11 | Discharge: 2022-09-11 | Disposition: A | Discharge status: Home / Self Care | Payer: No Typology Code available for payment source | Attending: Emergency Medicine | Admitting: Emergency Medicine

## 2022-09-11 DIAGNOSIS — R188 Other ascites: Secondary | ICD-10-CM | POA: Diagnosis not present

## 2022-09-11 DIAGNOSIS — R140 Abdominal distension (gaseous): Secondary | ICD-10-CM | POA: Diagnosis present

## 2022-09-11 DIAGNOSIS — I10 Essential (primary) hypertension: Secondary | ICD-10-CM | POA: Insufficient documentation

## 2022-09-11 HISTORY — DX: Unspecified cirrhosis of liver: K74.60

## 2022-09-11 LAB — CBC WITH DIFFERENTIAL/PLATELET
Abs Immature Granulocytes: 0 10*3/uL (ref 0.00–0.07)
Basophils Absolute: 0 10*3/uL (ref 0.0–0.1)
Basophils Relative: 0 %
Eosinophils Absolute: 0 10*3/uL (ref 0.0–0.5)
Eosinophils Relative: 1 %
HCT: 38.6 % (ref 36.0–46.0)
Hemoglobin: 13 g/dL (ref 12.0–15.0)
Immature Granulocytes: 0 %
Lymphocytes Relative: 27 %
Lymphs Abs: 0.7 10*3/uL (ref 0.7–4.0)
MCH: 27.1 pg (ref 26.0–34.0)
MCHC: 33.7 g/dL (ref 30.0–36.0)
MCV: 80.4 fL (ref 80.0–100.0)
Monocytes Absolute: 0.1 10*3/uL (ref 0.1–1.0)
Monocytes Relative: 5 %
Neutro Abs: 1.6 10*3/uL — ABNORMAL LOW (ref 1.7–7.7)
Neutrophils Relative %: 67 %
Platelets: 59 10*3/uL — ABNORMAL LOW (ref 150–400)
RBC: 4.8 MIL/uL (ref 3.87–5.11)
RDW: 16.2 % — ABNORMAL HIGH (ref 11.5–15.5)
Smear Review: DECREASED
WBC: 2.4 10*3/uL — ABNORMAL LOW (ref 4.0–10.5)
nRBC: 0 % (ref 0.0–0.2)

## 2022-09-11 LAB — COMPREHENSIVE METABOLIC PANEL
ALT: 15 U/L (ref 0–44)
AST: 35 U/L (ref 15–41)
Albumin: 3.7 g/dL (ref 3.5–5.0)
Alkaline Phosphatase: 82 U/L (ref 38–126)
Anion gap: 6 (ref 5–15)
BUN: 21 mg/dL (ref 8–23)
CO2: 23 mmol/L (ref 22–32)
Calcium: 8.6 mg/dL — ABNORMAL LOW (ref 8.9–10.3)
Chloride: 108 mmol/L (ref 98–111)
Creatinine, Ser: 1.02 mg/dL — ABNORMAL HIGH (ref 0.44–1.00)
GFR, Estimated: 60 mL/min — ABNORMAL LOW (ref >60)
Glucose, Bld: 121 mg/dL — ABNORMAL HIGH (ref 70–99)
Potassium: 4 mmol/L (ref 3.5–5.1)
Sodium: 137 mmol/L (ref 135–145)
Total Bilirubin: 2.1 mg/dL — ABNORMAL HIGH (ref 0.3–1.2)
Total Protein: 7.3 g/dL (ref 6.5–8.1)

## 2022-09-11 LAB — PROTIME-INR
INR: 1.2 (ref 0.8–1.2)
Prothrombin Time: 15.3 seconds — ABNORMAL HIGH (ref 11.4–15.2)

## 2022-09-11 NOTE — ED Provider Notes (Signed)
[facility] Provider Note    Event Date/Time   First MD Initiated Contact with Patient 09/11/22 1323     (approximate)   History   Bloated   HPI  [name] is a 67 y.o. female   Past medical history of liver cirrhosis, esophageal varices, iron deficiency anemia, hypertension, ascites requiring therapeutic paracentesis in late February 2024 who returns to the emergency department today due to progressive increase in her abdominal distention and discomfort related to abdominal distention and shortness of breath now.  Denies pain fever or chills.  She is [facility] called her [place] yesterday who advised that she come back to the emergency department at [facility] to get a paracentesis again since their facility has no resources to perform this procedure.  She has no other acute medical complaints.  Independent Historian contributed to assessment above: Husband who is at bedside  External Medical Documents Reviewed: Emergency department notes dated 08/30/2022 for abdominal section got a CT scan that showed ascites and had a IR procedure therapeutic paracentesis with 6 L removed with no complications and the patient felt much better after the procedure.      Physical Exam   Triage Vital Signs: ED Triage Vitals  Enc Vitals Group     BP 09/11/22 1316 (!) 141/89     Pulse Rate 09/11/22 1316 67     Resp 09/11/22 1316 18     Temp 09/11/22 1316 97.7 F (36.5 C)     Temp Source 09/11/22 1316 Oral     SpO2 09/11/22 1316 99 %     Weight 09/11/22 1313 167 lb (75.8 kg)     Height 09/11/22 1313 '5\' 2"'$  (1.575 m)     Head Circumference --      Peak Flow --      Pain Score 09/11/22 1313 8     Pain Loc --      Pain Edu? --      Excl. in [place]? --     Most recent vital signs: Vitals:   09/11/22 1330 09/11/22 1400  BP: (!) 167/83 (!) 141/81  Pulse: 64 61  Resp:  10  Temp:    SpO2: 100% 96%    General: Awake, no distress.  CV:  Good peripheral  perfusion.  Resp:  Normal effort.  Abd:  No distention.  Other:  Awake alert no acute distress with a distended abdomen that is nontender to palpation and normal vital signs slightly hypertensive no fever.   ED Results / Procedures / Treatments   Labs (all labs ordered are listed, but only abnormal results are displayed) Labs Reviewed  COMPREHENSIVE METABOLIC PANEL - Abnormal; Notable for the following components:      Result Value   Glucose, Bld 121 (*)    Creatinine, Ser 1.02 (*)    Calcium 8.6 (*)    Total Bilirubin 2.1 (*)    GFR, Estimated 60 (*)    All other components within normal limits  CBC WITH DIFFERENTIAL/PLATELET - Abnormal; Notable for the following components:   WBC 2.4 (*)    RDW 16.2 (*)    Platelets 59 (*)    Neutro Abs 1.6 (*)    All other components within normal limits  PROTIME-INR - Abnormal; Notable for the following components:   Prothrombin Time 15.3 (*)    All other components within normal limits     I ordered and reviewed the above labs they are notable for creatinine  is at baseline and platelets are low, consistent with prior   PROCEDURES:  Critical Care performed: No  Procedures   MEDICATIONS ORDERED IN ED: Medications - No data to display  External physician / consultants:  I spoke with IR consultant regarding care plan for this patient.   IMPRESSION / MDM / ASSESSMENT AND PLAN / ED COURSE  I reviewed the triage vital signs and the nursing notes.                                Patient's presentation is most consistent with acute presentation with potential threat to life or bodily function.  Differential diagnosis includes, but is not limited to, abdominal distention due to ascites in the setting of cirrhosis, SBP, intra-abdominal infection or obstruction   The patient is on the cardiac monitor to evaluate for evidence of arrhythmia and/or significant heart rate changes.  MDM: Patient with decompensated cirrhosis with  abdominal distention and a known ascites requiring therapeutic paracentesis approximately 3 weeks ago with gradual worsening of symptoms will need another therapeutic paracentesis today.  I considered but doubt intra-abdominal infection or SBP given no pain, benign exam, no fever.  Get basic labs including PT and INR and consult with IR for procedure today.   Interventional radiology Dr. Earleen Newport consulted and will attempt to come to get drainage for patient today but is uncertain on timing-also offered to schedule the patient for procedure on Monday morning as an alternative.  I discussed this alternative with the patient and she states that her symptoms are manageable enough that she would like to do it on Monday rather than wait further in the emergency department.  I considered hospitalization for admission or observation however given that the patient has IR procedure scheduled for Monday morning, and feels well and elects to await procedure on Monday rather than wait in the emergency department for potential IR drainage, labs are within normal limits and at baseline and normal vital signs she can go home now for drainage on Monday.  She knows to come back if any new or worsening symptoms.        FINAL CLINICAL IMPRESSION(S) / ED DIAGNOSES   Final diagnoses:  Abdominal distension  Other ascites     Rx / DC Orders   ED Discharge Orders     None        Note:  This document was prepared using Dragon voice recognition software and may include unintentional dictation errors.    Lucillie Garfinkel, MD 09/11/22 [phone]

## 2022-09-11 NOTE — Discharge Instructions (Signed)
Your abdominal distention and tightness is likely due to ascites or extra fluid in your abdomen that needs to be removed much like you had done 3 weeks ago.  The interventional radiology team will reach out to you about your Monday appointment to have this procedure done.  If in the meantime you develop any new or worsening symptoms come back to the emergency department.  Thank you for choosing [place] for your health care today!  Please see your primary doctor this week for a follow up appointment.   Sometimes, in the early stages of certain disease courses it is difficult to detect in the emergency department evaluation -- so, it is important that you continue to monitor your symptoms and call your doctor right away or return to the emergency department if you develop any new or worsening symptoms.  Please go to the following website to schedule new (and existing) patient appointments:   [email or web address]  If you do not have a primary doctor try calling the following clinics to establish care:  If you have insurance:  [facility] [id number] [place] [address]   [facility]  [phone] [place] [place] [address]   If you do not have insurance:  [facility]  [phone] [address] [place] [address]   The following is another list of primary care offices in the area who are accepting new patients at this time.  Please reach out to one of them directly and let them know you would like to schedule an appointment to follow up on an Emergency Department visit, and/or to establish a new primary care provider (PCP).  There are likely other primary care clinics in the are who are accepting new patients, but this is an excellent place to start:  [place] physician: Dr Lavon Paganini [address] [place], [place]  [address] [phone]  [facility] Lead Physician: Dr Steele Sizer [address], [place], [place] [address] [phone]  [place] Physician: Dr Park Liter [address] [place], [place] [address] [phone]  [facility] Lead Physician: Dr Dewaine Oats [place], [place], [place] [address] [phone]  [facility] Physician: Dr Halina Maidens [address] [place], [place] [address] [phone]   It was my pleasure to care for you today.   Hoover Brunette Jacelyn Grip, MD

## 2022-09-11 NOTE — ED Triage Notes (Signed)
C/O abdominal swelling x 10 days.  States was seen through ED for same 3 weeks ago and paracentesis done, 6L removed..    Abdomen swollen and firm

## 2022-09-13 ENCOUNTER — Ambulatory Visit
Admission: RE | Admit: 2022-09-13 | Discharge: 2022-09-13 | Disposition: A | Discharge status: Home / Self Care | Payer: Medicare PPO | Source: Ambulatory Visit | Attending: Radiology | Admitting: Radiology

## 2022-09-13 ENCOUNTER — Encounter: Payer: Self-pay | Admitting: Internal Medicine

## 2022-09-13 ENCOUNTER — Other Ambulatory Visit: Payer: Self-pay | Admitting: Radiology

## 2022-09-13 DIAGNOSIS — R188 Other ascites: Secondary | ICD-10-CM

## 2022-09-13 MED ORDER — LIDOCAINE HCL (PF) 1 % IJ SOLN
10.0000 mL | Freq: Once | INTRAMUSCULAR | Status: AC
  Administered 2022-09-13: 10 mL via INTRADERMAL
  Filled 2022-09-13: qty 10

## 2022-09-13 NOTE — Discharge Instructions (Signed)
Reviewed with patient

## 2022-09-15 NOTE — Progress Notes (Signed)
   Portal Hypertension Clinic Screening Evaluation   Indication for evaluation: [name] is a 67 y.o. female undergoing preliminary evaluation in the [facility] due to recurrent ascites.  Referring Physician/Established Gastroenterologist:  [facility]/Dr. Marius Ditch  There is a paucity of GI/Hepatology information in the available EHR.  Etiology of cirrhosis: uncertain Initially diagnosed: uncertain # of paracentesis in last month: 2 # of paracentesis in last 2 months: 2 History of hepatic hydrothorax:  no History of hepatic encephalopathy: no  Prior evaluation for liver transplant: uncertain History of hepatocellular carcinoma: no  Prior esophagogastroduodenoscopy/intervention: 05/25/19 large esophageal varcies incompletely eradicated with banding, portal hypertensive gastropathy Current esophageal varices: yes Current gastric varices: no History of hematemesis: yes  Current diuretic regimen: none Current pharmacologic encephalopathy prophylaxis/treatment: none  History of renal dysfunction: CKD 2 History of hemodialysis: No  History of cardiac dysfunction: no  Other pertinent past medical history: iron deficiency anemia, hypertension, irritable bowel syndrome   Imaging: Prior cross sectional imaging of portal system: 08/30/22  Patent, dilated main portal and splenic veins.  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  No significant varices or portosystemic shunts.  Echocardiogram:  02/19/19 IMPRESSIONS   1. The left ventricle has normal systolic function, with an ejection  fraction of 55-60%. The cavity size was normal. Left ventricular diastolic  Doppler parameters are consistent with impaired relaxation.   2. The right ventricle has normal systolic function. The cavity was  normal. There is no increase in right ventricular wall thickness. Right  ventricular systolic pressure is normal with an estimated  pressure of 30.2  mmHg.   3. Left atrial size was mild-moderately dilated.   4. The mitral valve is grossly normal.   5. The aortic valve is grossly normal. No stenosis of the aortic valve.   6. The aorta is normal unless otherwise noted.    Labs: Creatinine: 1.02 Total Bilirubin: 2.1 INR: 1.2 Sodium: 137 Albumin: 3.7  Child-Pugh = 8 points, class B MELD = 11 (6.0% estimated 54-month mortality) [place] Index of Post-TIPS Survival (FIPS) = 0.88 (overall survival predicted at 1 month 89.5%, 3 months 68.5%, and 6 months 56.4%)    Assessment: [name] is a 67 y.o. 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  Additionally, she has gradually worsening renal failure with a presumed component of that being hepatorenal. Recent CT demonstrates a dilated portal system with thrombus of the central IMV and SMV.  No record of diuresis, though the patient receives most care at the [place] and there is limited data in our records.  After preliminary evaluation, this patient may be a good candidate for TIPS creation, should diuretic maximization be unsuccessful in controlling ascites or if renal function limits diuretic use.  Recommendation: Formal consult for TIPS creation could be considered.  The patient's [facility] team will be contacted for further discussion.    Electronically Signed: Suzette Battiest, MD 09/15/2022, 4:29 PM

## 2022-09-23 ENCOUNTER — Emergency Department (HOSPITAL_COMMUNITY): Payer: No Typology Code available for payment source

## 2022-09-23 ENCOUNTER — Other Ambulatory Visit: Payer: Self-pay

## 2022-09-23 ENCOUNTER — Encounter (HOSPITAL_COMMUNITY): Payer: Self-pay

## 2022-09-23 ENCOUNTER — Inpatient Hospital Stay (HOSPITAL_COMMUNITY)
Admission: EM | Admit: 2022-09-23 | Discharge: 2022-09-28 | DRG: 433 | Disposition: A | Discharge status: Home / Self Care | Payer: No Typology Code available for payment source | Attending: Internal Medicine | Admitting: Internal Medicine

## 2022-09-23 DIAGNOSIS — K7290 Hepatic failure, unspecified without coma: Secondary | ICD-10-CM | POA: Diagnosis present

## 2022-09-23 DIAGNOSIS — R188 Other ascites: Secondary | ICD-10-CM | POA: Diagnosis not present

## 2022-09-23 DIAGNOSIS — K921 Melena: Secondary | ICD-10-CM | POA: Diagnosis present

## 2022-09-23 DIAGNOSIS — K7460 Unspecified cirrhosis of liver: Principal | ICD-10-CM | POA: Diagnosis present

## 2022-09-23 DIAGNOSIS — Z885 Allergy status to narcotic agent status: Secondary | ICD-10-CM

## 2022-09-23 DIAGNOSIS — D696 Thrombocytopenia, unspecified: Secondary | ICD-10-CM | POA: Diagnosis present

## 2022-09-23 DIAGNOSIS — I8510 Secondary esophageal varices without bleeding: Secondary | ICD-10-CM | POA: Diagnosis present

## 2022-09-23 DIAGNOSIS — K2970 Gastritis, unspecified, without bleeding: Secondary | ICD-10-CM | POA: Diagnosis present

## 2022-09-23 DIAGNOSIS — Z79899 Other long term (current) drug therapy: Secondary | ICD-10-CM

## 2022-09-23 DIAGNOSIS — Z8261 Family history of arthritis: Secondary | ICD-10-CM

## 2022-09-23 DIAGNOSIS — I10 Essential (primary) hypertension: Secondary | ICD-10-CM | POA: Diagnosis present

## 2022-09-23 DIAGNOSIS — E876 Hypokalemia: Secondary | ICD-10-CM | POA: Diagnosis not present

## 2022-09-23 DIAGNOSIS — K219 Gastro-esophageal reflux disease without esophagitis: Secondary | ICD-10-CM | POA: Diagnosis present

## 2022-09-23 DIAGNOSIS — D500 Iron deficiency anemia secondary to blood loss (chronic): Secondary | ICD-10-CM | POA: Diagnosis present

## 2022-09-23 DIAGNOSIS — Z811 Family history of alcohol abuse and dependence: Secondary | ICD-10-CM

## 2022-09-23 DIAGNOSIS — E669 Obesity, unspecified: Secondary | ICD-10-CM | POA: Diagnosis present

## 2022-09-23 DIAGNOSIS — Z809 Family history of malignant neoplasm, unspecified: Secondary | ICD-10-CM

## 2022-09-23 DIAGNOSIS — N289 Disorder of kidney and ureter, unspecified: Secondary | ICD-10-CM | POA: Diagnosis present

## 2022-09-23 DIAGNOSIS — B9620 Unspecified Escherichia coli [E. coli] as the cause of diseases classified elsewhere: Secondary | ICD-10-CM | POA: Diagnosis present

## 2022-09-23 DIAGNOSIS — E8770 Fluid overload, unspecified: Secondary | ICD-10-CM | POA: Diagnosis present

## 2022-09-23 DIAGNOSIS — Z1611 Resistance to penicillins: Secondary | ICD-10-CM | POA: Diagnosis present

## 2022-09-23 DIAGNOSIS — Z91011 Allergy to milk products: Secondary | ICD-10-CM

## 2022-09-23 DIAGNOSIS — T24021A Burn of unspecified degree of right knee, initial encounter: Secondary | ICD-10-CM | POA: Diagnosis present

## 2022-09-23 DIAGNOSIS — K7581 Nonalcoholic steatohepatitis (NASH): Secondary | ICD-10-CM | POA: Diagnosis present

## 2022-09-23 DIAGNOSIS — Z6830 Body mass index (BMI) 30.0-30.9, adult: Secondary | ICD-10-CM

## 2022-09-23 DIAGNOSIS — N390 Urinary tract infection, site not specified: Secondary | ICD-10-CM | POA: Diagnosis present

## 2022-09-23 DIAGNOSIS — Z96659 Presence of unspecified artificial knee joint: Secondary | ICD-10-CM | POA: Diagnosis present

## 2022-09-23 DIAGNOSIS — X19XXXA Contact with other heat and hot substances, initial encounter: Secondary | ICD-10-CM | POA: Diagnosis present

## 2022-09-23 DIAGNOSIS — K3189 Other diseases of stomach and duodenum: Secondary | ICD-10-CM | POA: Diagnosis present

## 2022-09-23 DIAGNOSIS — E8809 Other disorders of plasma-protein metabolism, not elsewhere classified: Secondary | ICD-10-CM | POA: Diagnosis present

## 2022-09-23 DIAGNOSIS — E872 Acidosis, unspecified: Secondary | ICD-10-CM | POA: Diagnosis present

## 2022-09-23 DIAGNOSIS — K766 Portal hypertension: Secondary | ICD-10-CM | POA: Diagnosis present

## 2022-09-23 LAB — COMPREHENSIVE METABOLIC PANEL
ALT: 16 U/L (ref 0–44)
AST: 31 U/L (ref 15–41)
Albumin: 3 g/dL — ABNORMAL LOW (ref 3.5–5.0)
Alkaline Phosphatase: 90 U/L (ref 38–126)
Anion gap: 6 (ref 5–15)
BUN: 16 mg/dL (ref 8–23)
CO2: 22 mmol/L (ref 22–32)
Calcium: 8.6 mg/dL — ABNORMAL LOW (ref 8.9–10.3)
Chloride: 109 mmol/L (ref 98–111)
Creatinine, Ser: 1.02 mg/dL — ABNORMAL HIGH (ref 0.44–1.00)
GFR, Estimated: 60 mL/min — ABNORMAL LOW (ref >60)
Glucose, Bld: 135 mg/dL — ABNORMAL HIGH (ref 70–99)
Potassium: 4 mmol/L (ref 3.5–5.1)
Sodium: 137 mmol/L (ref 135–145)
Total Bilirubin: 1.1 mg/dL (ref 0.3–1.2)
Total Protein: 6.7 g/dL (ref 6.5–8.1)

## 2022-09-23 LAB — CBC WITH DIFFERENTIAL/PLATELET
Abs Immature Granulocytes: 0.01 10*3/uL (ref 0.00–0.07)
Basophils Absolute: 0 10*3/uL (ref 0.0–0.1)
Basophils Relative: 0 %
Eosinophils Absolute: 0.1 10*3/uL (ref 0.0–0.5)
Eosinophils Relative: 2 %
HCT: 37.3 % (ref 36.0–46.0)
Hemoglobin: 12.6 g/dL (ref 12.0–15.0)
Immature Granulocytes: 0 %
Lymphocytes Relative: 16 %
Lymphs Abs: 0.7 10*3/uL (ref 0.7–4.0)
MCH: 27.4 pg (ref 26.0–34.0)
MCHC: 33.8 g/dL (ref 30.0–36.0)
MCV: 81.1 fL (ref 80.0–100.0)
Monocytes Absolute: 0.3 10*3/uL (ref 0.1–1.0)
Monocytes Relative: 7 %
Neutro Abs: 3.4 10*3/uL (ref 1.7–7.7)
Neutrophils Relative %: 75 %
Platelets: 73 10*3/uL — ABNORMAL LOW (ref 150–400)
RBC: 4.6 MIL/uL (ref 3.87–5.11)
RDW: 15.8 % — ABNORMAL HIGH (ref 11.5–15.5)
WBC: 4.5 10*3/uL (ref 4.0–10.5)
nRBC: 0 % (ref 0.0–0.2)

## 2022-09-23 LAB — URINALYSIS, ROUTINE W REFLEX MICROSCOPIC
Bilirubin Urine: NEGATIVE
Glucose, UA: NEGATIVE mg/dL
Hgb urine dipstick: NEGATIVE
Ketones, ur: NEGATIVE mg/dL
Nitrite: POSITIVE — AB
Protein, ur: NEGATIVE mg/dL
Specific Gravity, Urine: 1.023 (ref 1.005–1.030)
pH: 5 (ref 5.0–8.0)

## 2022-09-23 LAB — PROTIME-INR
INR: 1.2 (ref 0.8–1.2)
Prothrombin Time: 14.9 seconds (ref 11.4–15.2)

## 2022-09-23 MED ORDER — ONDANSETRON HCL 4 MG/2ML IJ SOLN
4.0000 mg | Freq: Once | INTRAMUSCULAR | Status: AC
  Administered 2022-09-24: 4 mg via INTRAVENOUS
  Filled 2022-09-23: qty 2

## 2022-09-23 MED ORDER — FENTANYL CITRATE PF 50 MCG/ML IJ SOSY
50.0000 ug | PREFILLED_SYRINGE | Freq: Once | INTRAMUSCULAR | Status: AC
  Administered 2022-09-23: 50 ug via INTRAVENOUS
  Filled 2022-09-23: qty 1

## 2022-09-23 NOTE — ED Triage Notes (Signed)
Pt reports abdominal swelling for a "couple of days." Hx of ascites and reports she had a paracentesis done 2 weeks ago and had 6L removed.

## 2022-09-23 NOTE — ED Provider Notes (Signed)
[facility] Provider Note   CSN: [id number] Arrival date & time: 09/23/22  1524     History  Chief Complaint  Patient presents with   Abdominal Pain    [name] is a 67 y.o. female.  Patient presents to the emergency department for evaluation of abdominal pain and distention.  Patient does report a history of cirrhosis.  She reports that she had distention of her abdomen a couple of weeks ago, went to [facility] and was diagnosed with ascites.  She had paracentesis of 6 L.  That was the first time she had ascites like that, it was her first paracentesis.  She reports that her pain started 2 days ago and has progressively worsened.  Abdominal distention has increased over this time as well.  Patient reports that she has specific pain in the area of a known umbilical hernia.       Home Medications Prior to Admission medications   Medication Sig Start Date End Date Taking? Authorizing Provider  CALCIUM PO Take 1 tablet by mouth 2 (two) times daily.    [provider]  esomeprazole (NEXIUM) 40 MG capsule Take 1 capsule (40 mg total) by mouth 2 (two) times daily before a meal. 05/25/19 06/24/19  Vanga, Tally Due, MD  nadolol (CORGARD) 20 MG tablet Take 1 tablet (20 mg total) by mouth daily. 05/21/19 08/19/19  Lin Landsman, MD  ondansetron (ZOFRAN ODT) 4 MG disintegrating tablet Take 1 tablet (4 mg total) by mouth every 8 (eight) hours as needed for nausea or vomiting. 02/21/19   Gouru, Aruna, MD  POTASSIUM PO Take 1 tablet by mouth daily.    [provider]  PRESCRIPTION MEDICATION Take 1 tablet by mouth 2 (two) times daily.    [provider]  promethazine (PHENERGAN) 25 MG tablet Take 25 mg by mouth daily as needed. 04/15/22   [provider]  rizatriptan (MAXALT) 10 MG tablet Take by mouth. 04/19/22   [provider]  traMADol (ULTRAM) 50 MG tablet Take 1 tablet (50 mg  total) by mouth daily as needed for moderate pain or severe pain. 04/13/19   Cammie Sickle, MD  verapamil (CALAN-SR) 120 MG CR tablet Take by mouth. 04/15/22   [provider]      Allergies    Hydrocodone-acetaminophen, Codeine, and Lactose intolerance (gi)    Review of Systems   Review of Systems  Physical Exam Updated Vital Signs BP 115/67   Pulse 62   Temp 97.8 F (36.6 C) (Oral)   Resp 18   Ht 5\' 2"  (1.575 m)   Wt 75.8 kg   SpO2 96%   BMI 30.54 kg/m  Physical Exam Vitals and nursing note reviewed.  Constitutional:      General: She is not in acute distress.    Appearance: She is well-developed.  HENT:     Head: Normocephalic and atraumatic.     Mouth/Throat:     Mouth: Mucous membranes are moist.  Eyes:     General: Vision grossly intact. Gaze aligned appropriately.     Extraocular Movements: Extraocular movements intact.     Conjunctiva/sclera: Conjunctivae normal.  Cardiovascular:     Rate and Rhythm: Normal rate and regular rhythm.     Pulses: Normal pulses.     Heart sounds: Normal heart sounds, S1 normal and S2 normal. No murmur heard.    No friction rub. No gallop.  Pulmonary:  Effort: Pulmonary effort is normal. No respiratory distress.     Breath sounds: Normal breath sounds.  Abdominal:     General: Bowel sounds are normal. There is distension.     Palpations: Abdomen is soft.     Tenderness: There is generalized abdominal tenderness. There is no guarding or rebound.     Hernia: A hernia is present. Hernia is present in the umbilical area.  Musculoskeletal:        General: No swelling.     Cervical back: Full passive range of motion without pain, normal range of motion and neck supple. No spinous process tenderness or muscular tenderness. Normal range of motion.     Right lower leg: No edema.     Left lower leg: No edema.  Skin:    General: Skin is warm and dry.     Capillary Refill: Capillary refill takes less than 2 seconds.      Findings: No ecchymosis, erythema, rash or wound.  Neurological:     General: No focal deficit present.     Mental Status: She is alert and oriented to person, place, and time.     GCS: GCS eye subscore is 4. GCS verbal subscore is 5. GCS motor subscore is 6.     Cranial Nerves: Cranial nerves 2-12 are intact.     Sensory: Sensation is intact.     Motor: Motor function is intact.     Coordination: Coordination is intact.  Psychiatric:        Attention and Perception: Attention normal.        Mood and Affect: Mood normal.        Speech: Speech normal.        Behavior: Behavior normal.     ED Results / Procedures / Treatments   Labs (all labs ordered are listed, but only abnormal results are displayed) Labs Reviewed  COMPREHENSIVE METABOLIC PANEL - Abnormal; Notable for the following components:      Result Value   Glucose, Bld 135 (*)    Creatinine, Ser 1.02 (*)    Calcium 8.6 (*)    Albumin 3.0 (*)    GFR, Estimated 60 (*)    All other components within normal limits  CBC WITH DIFFERENTIAL/PLATELET - Abnormal; Notable for the following components:   RDW 15.8 (*)    Platelets 73 (*)    All other components within normal limits  URINALYSIS, ROUTINE W REFLEX MICROSCOPIC - Abnormal; Notable for the following components:   Color, Urine AMBER (*)    APPearance HAZY (*)    Nitrite POSITIVE (*)    Leukocytes,Ua TRACE (*)    Bacteria, UA MANY (*)    All other components within normal limits  PROTIME-INR  HIV ANTIBODY (ROUTINE TESTING W REFLEX)    EKG None  Radiology CT ABDOMEN PELVIS W CONTRAST  Result Date: 09/24/2022 CLINICAL DATA:  Abdominal distension, pain EXAM: CT ABDOMEN AND PELVIS WITH CONTRAST TECHNIQUE: Multidetector CT imaging of the abdomen and pelvis was performed using the standard protocol following bolus administration of intravenous contrast. RADIATION DOSE REDUCTION: This exam was performed according to the departmental dose-optimization program which  includes automated exposure control, adjustment of the mA and/or kV according to patient size and/or use of iterative reconstruction technique. CONTRAST:  100mL OMNIPAQUE IOHEXOL 350 MG/ML SOLN COMPARISON:  08/30/2022 FINDINGS: Lower chest: No acute findings Hepatobiliary: Shrunken, nodular liver compatible with cirrhosis. Layering gallstones within the gallbladder. No focal hepatic abnormality. Pancreas: No focal abnormality or ductal dilatation.  Spleen: Splenomegaly with a craniocaudal length of 22 cm. Adrenals/Urinary Tract: No adrenal abnormality. No focal renal abnormality. No stones or hydronephrosis. Urinary bladder is unremarkable. Stomach/Bowel: Sigmoid diverticulosis. No active diverticulitis. Stomach and small bowel decompressed, unremarkable. Vascular/Lymphatic: Shotty retroperitoneal lymph nodes, none pathologically enlarged. No evidence of aneurysm or adenopathy. Reproductive: Prior hysterectomy.  No adnexal masses. Other: Large volume ascites. Small umbilical hernia containing ascites. Musculoskeletal: No acute bony abnormality. IMPRESSION: Changes of cirrhosis with associated splenomegaly and large volume ascites. Sigmoid diverticulosis. No acute findings. Electronically Signed   By: Rolm Baptise M.D.   On: 09/24/2022 01:00   DG Chest 2 View  Result Date: 09/23/2022 CLINICAL DATA:  67 year old female presents for evaluation of shortness of breath. EXAM: CHEST - 2 VIEW COMPARISON:  February 16, 2019 FINDINGS: Cardiomediastinal contours and hilar structures are stable. No pneumothorax. No pleural effusion. On limited assessment no acute skeletal findings. IMPRESSION: No acute findings. Electronically Signed   By: Zetta Bills M.D.   On: 09/23/2022 16:18    Procedures Procedures    Medications Ordered in ED Medications  fentaNYL (SUBLIMAZE) injection 50 mcg (50 mcg Intravenous Given 09/23/22 2359)  ondansetron (ZOFRAN) injection 4 mg (4 mg Intravenous Given 09/24/22 0000)  iohexol (OMNIPAQUE)  350 MG/ML injection 75 mL (75 mLs Intravenous Contrast Given 09/24/22 0055)    ED Course/ Medical Decision Making/ A&P                             Medical Decision Making Amount and/or Complexity of Data Reviewed Radiology: ordered.  Risk Prescription drug management. Decision regarding hospitalization.   Patient presents to the emergency department for evaluation of abdominal pain and distention  Differential diagnosis considered includes, but not limited to: Tense ascites; SBP; small bowel obstruction; incarcerated hernia  Patient with known nonalcoholic cirrhosis presents to the emergency department for evaluation of diffuse abdominal pain and distention.  Additionally, she has an umbilical hernia that is larger than usual and tender to the touch.  Patient does have some focal tenderness in the area of the hernia, generalized tenderness but no obvious peritonitis.  Lab work was unremarkable.  Patient recently started accumulating ascites and has had prior drainage through the ER at [facility], has not established gastroenterology care.  It appears that she will need recurrent paracentesis.  I doubt SBP at this time, but cannot exclude it.  Because she does not have outpatient gastroenterology follow-up and her need for paracentesis appears to be fairly frequent currently, will ask hospitalist to admit to help manage.        Final Clinical Impression(s) / ED Diagnoses Final diagnoses:  Other ascites    Rx / DC Orders ED Discharge Orders     None         Abygail Galeno, Gwenyth Allegra, MD 09/24/22 [phone]

## 2022-09-23 NOTE — ED Provider Triage Note (Signed)
Emergency Medicine Provider Triage Evaluation Note  [name] , a 67 y.o. female  was evaluated in triage.  Pt complains of abdominal distention with shortness of breath.  Patient states that for the past 2 days she has noticed swelling in her abdomen.  She has a history of ascites and decompensated liver failure.  She most recently had a paracentesis a few weeks ago.  She states the current accumulation of seem to happen over 2 days time.  She complains of abdominal pressure, shortness of breath related to the apparent excess fluid  Review of Systems  Positive: As above Negative: As above  Physical Exam  BP (!) 146/92 (BP Location: Right Arm)   Pulse 77   Temp 98.2 F (36.8 C)   Resp 20   SpO2 100%  Gen:   Awake, no distress   Resp:  Normal effort  MSK:   Moves extremities without difficulty  Other:    Medical Decision Making  Medically screening exam initiated at 3:40 PM.  Appropriate orders placed.  [name] was informed that the remainder of the evaluation will be completed by another provider, this initial triage assessment does not replace that evaluation, and the importance of remaining in the ED until their evaluation is complete.     Dorothyann Peng, PA-C 09/23/22 1541

## 2022-09-24 ENCOUNTER — Emergency Department (HOSPITAL_COMMUNITY): Payer: No Typology Code available for payment source

## 2022-09-24 ENCOUNTER — Inpatient Hospital Stay (HOSPITAL_COMMUNITY): Payer: No Typology Code available for payment source

## 2022-09-24 DIAGNOSIS — K219 Gastro-esophageal reflux disease without esophagitis: Secondary | ICD-10-CM | POA: Diagnosis present

## 2022-09-24 DIAGNOSIS — I8510 Secondary esophageal varices without bleeding: Secondary | ICD-10-CM | POA: Diagnosis present

## 2022-09-24 DIAGNOSIS — E669 Obesity, unspecified: Secondary | ICD-10-CM | POA: Diagnosis present

## 2022-09-24 DIAGNOSIS — K766 Portal hypertension: Secondary | ICD-10-CM | POA: Diagnosis present

## 2022-09-24 DIAGNOSIS — E872 Acidosis, unspecified: Secondary | ICD-10-CM | POA: Diagnosis present

## 2022-09-24 DIAGNOSIS — Z91011 Allergy to milk products: Secondary | ICD-10-CM | POA: Diagnosis not present

## 2022-09-24 DIAGNOSIS — I8500 Esophageal varices without bleeding: Secondary | ICD-10-CM | POA: Diagnosis not present

## 2022-09-24 DIAGNOSIS — E876 Hypokalemia: Secondary | ICD-10-CM | POA: Diagnosis not present

## 2022-09-24 DIAGNOSIS — N390 Urinary tract infection, site not specified: Secondary | ICD-10-CM | POA: Diagnosis present

## 2022-09-24 DIAGNOSIS — K2950 Unspecified chronic gastritis without bleeding: Secondary | ICD-10-CM | POA: Diagnosis not present

## 2022-09-24 DIAGNOSIS — Z1611 Resistance to penicillins: Secondary | ICD-10-CM | POA: Diagnosis present

## 2022-09-24 DIAGNOSIS — K259 Gastric ulcer, unspecified as acute or chronic, without hemorrhage or perforation: Secondary | ICD-10-CM | POA: Diagnosis not present

## 2022-09-24 DIAGNOSIS — E8809 Other disorders of plasma-protein metabolism, not elsewhere classified: Secondary | ICD-10-CM | POA: Diagnosis present

## 2022-09-24 DIAGNOSIS — K7290 Hepatic failure, unspecified without coma: Secondary | ICD-10-CM

## 2022-09-24 DIAGNOSIS — D500 Iron deficiency anemia secondary to blood loss (chronic): Secondary | ICD-10-CM | POA: Diagnosis present

## 2022-09-24 DIAGNOSIS — R195 Other fecal abnormalities: Secondary | ICD-10-CM | POA: Diagnosis not present

## 2022-09-24 DIAGNOSIS — Z79899 Other long term (current) drug therapy: Secondary | ICD-10-CM | POA: Diagnosis not present

## 2022-09-24 DIAGNOSIS — K7460 Unspecified cirrhosis of liver: Secondary | ICD-10-CM | POA: Diagnosis present

## 2022-09-24 DIAGNOSIS — K7581 Nonalcoholic steatohepatitis (NASH): Secondary | ICD-10-CM | POA: Diagnosis present

## 2022-09-24 DIAGNOSIS — X19XXXA Contact with other heat and hot substances, initial encounter: Secondary | ICD-10-CM | POA: Diagnosis present

## 2022-09-24 DIAGNOSIS — K921 Melena: Secondary | ICD-10-CM | POA: Diagnosis present

## 2022-09-24 DIAGNOSIS — R188 Other ascites: Secondary | ICD-10-CM | POA: Diagnosis present

## 2022-09-24 DIAGNOSIS — N289 Disorder of kidney and ureter, unspecified: Secondary | ICD-10-CM | POA: Diagnosis present

## 2022-09-24 DIAGNOSIS — E8770 Fluid overload, unspecified: Secondary | ICD-10-CM | POA: Diagnosis present

## 2022-09-24 DIAGNOSIS — D696 Thrombocytopenia, unspecified: Secondary | ICD-10-CM

## 2022-09-24 DIAGNOSIS — D509 Iron deficiency anemia, unspecified: Secondary | ICD-10-CM

## 2022-09-24 DIAGNOSIS — Z6830 Body mass index (BMI) 30.0-30.9, adult: Secondary | ICD-10-CM | POA: Diagnosis not present

## 2022-09-24 DIAGNOSIS — Z885 Allergy status to narcotic agent status: Secondary | ICD-10-CM | POA: Diagnosis not present

## 2022-09-24 DIAGNOSIS — K2970 Gastritis, unspecified, without bleeding: Secondary | ICD-10-CM | POA: Diagnosis present

## 2022-09-24 DIAGNOSIS — B9620 Unspecified Escherichia coli [E. coli] as the cause of diseases classified elsewhere: Secondary | ICD-10-CM | POA: Diagnosis present

## 2022-09-24 DIAGNOSIS — I10 Essential (primary) hypertension: Secondary | ICD-10-CM | POA: Diagnosis present

## 2022-09-24 HISTORY — PX: IR PARACENTESIS: IMG2679

## 2022-09-24 LAB — LACTATE DEHYDROGENASE, PLEURAL OR PERITONEAL FLUID: LD, Fluid: 33 U/L — ABNORMAL HIGH (ref 3–23)

## 2022-09-24 LAB — CBC WITH DIFFERENTIAL/PLATELET
Abs Immature Granulocytes: 0.02 10*3/uL (ref 0.00–0.07)
Basophils Absolute: 0 10*3/uL (ref 0.0–0.1)
Basophils Relative: 0 %
Eosinophils Absolute: 0.1 10*3/uL (ref 0.0–0.5)
Eosinophils Relative: 2 %
HCT: 31.5 % — ABNORMAL LOW (ref 36.0–46.0)
Hemoglobin: 11.2 g/dL — ABNORMAL LOW (ref 12.0–15.0)
Immature Granulocytes: 1 %
Lymphocytes Relative: 21 %
Lymphs Abs: 0.9 10*3/uL (ref 0.7–4.0)
MCH: 27.9 pg (ref 26.0–34.0)
MCHC: 35.6 g/dL (ref 30.0–36.0)
MCV: 78.6 fL — ABNORMAL LOW (ref 80.0–100.0)
Monocytes Absolute: 0.3 10*3/uL (ref 0.1–1.0)
Monocytes Relative: 6 %
Neutro Abs: 2.9 10*3/uL (ref 1.7–7.7)
Neutrophils Relative %: 70 %
Platelets: 73 10*3/uL — ABNORMAL LOW (ref 150–400)
RBC: 4.01 MIL/uL (ref 3.87–5.11)
RDW: 15.8 % — ABNORMAL HIGH (ref 11.5–15.5)
WBC: 4.1 10*3/uL (ref 4.0–10.5)
nRBC: 0 % (ref 0.0–0.2)

## 2022-09-24 LAB — COMPREHENSIVE METABOLIC PANEL
ALT: 11 U/L (ref 0–44)
AST: 25 U/L (ref 15–41)
Albumin: 3 g/dL — ABNORMAL LOW (ref 3.5–5.0)
Alkaline Phosphatase: 70 U/L (ref 38–126)
Anion gap: 8 (ref 5–15)
BUN: 15 mg/dL (ref 8–23)
CO2: 20 mmol/L — ABNORMAL LOW (ref 22–32)
Calcium: 8.2 mg/dL — ABNORMAL LOW (ref 8.9–10.3)
Chloride: 109 mmol/L (ref 98–111)
Creatinine, Ser: 0.98 mg/dL (ref 0.44–1.00)
GFR, Estimated: >60 mL/min (ref >60)
Glucose, Bld: 134 mg/dL — ABNORMAL HIGH (ref 70–99)
Potassium: 3.6 mmol/L (ref 3.5–5.1)
Sodium: 137 mmol/L (ref 135–145)
Total Bilirubin: 0.8 mg/dL (ref 0.3–1.2)
Total Protein: 6 g/dL — ABNORMAL LOW (ref 6.5–8.1)

## 2022-09-24 LAB — ALBUMIN, PLEURAL OR PERITONEAL FLUID: Albumin, Fluid: <1.5 g/dL

## 2022-09-24 LAB — GLUCOSE, PLEURAL OR PERITONEAL FLUID: Glucose, Fluid: 116 mg/dL

## 2022-09-24 LAB — MRSA NEXT GEN BY PCR, NASAL: MRSA by PCR Next Gen: NOT DETECTED

## 2022-09-24 LAB — GRAM STAIN: Gram Stain: NONE SEEN

## 2022-09-24 LAB — MAGNESIUM: Magnesium: 1.8 mg/dL (ref 1.7–2.4)

## 2022-09-24 LAB — BODY FLUID CELL COUNT WITH DIFFERENTIAL
Eos, Fluid: 0 %
Lymphs, Fluid: 65 %
Monocyte-Macrophage-Serous Fluid: 29 % — ABNORMAL LOW (ref 50–90)
Neutrophil Count, Fluid: 5 % (ref 0–25)
Other Cells, Fluid: 1 %
Total Nucleated Cell Count, Fluid: 81 cu mm (ref 0–1000)

## 2022-09-24 LAB — PHOSPHORUS: Phosphorus: 2.9 mg/dL (ref 2.5–4.6)

## 2022-09-24 LAB — HIV ANTIBODY (ROUTINE TESTING W REFLEX): HIV Screen 4th Generation wRfx: NONREACTIVE

## 2022-09-24 MED ORDER — IOHEXOL 350 MG/ML SOLN
75.0000 mL | Freq: Once | INTRAVENOUS | Status: AC | PRN
  Administered 2022-09-24: 75 mL via INTRAVENOUS

## 2022-09-24 MED ORDER — FUROSEMIDE 20 MG PO TABS
20.0000 mg | ORAL_TABLET | Freq: Every day | ORAL | Status: DC
  Administered 2022-09-24 – 2022-09-25 (×2): 20 mg via ORAL
  Filled 2022-09-24 (×2): qty 1

## 2022-09-24 MED ORDER — POLYETHYLENE GLYCOL 3350 17 G PO PACK: 17.0000 g | PACK | Freq: Every day | ORAL | Status: DC | PRN

## 2022-09-24 MED ORDER — OXYCODONE HCL 5 MG PO TABS
5.0000 mg | ORAL_TABLET | Freq: Four times a day (QID) | ORAL | Status: DC | PRN
  Filled 2022-09-24 (×2): qty 1

## 2022-09-24 MED ORDER — PROCHLORPERAZINE EDISYLATE 10 MG/2ML IJ SOLN
5.0000 mg | Freq: Four times a day (QID) | INTRAMUSCULAR | Status: DC | PRN
  Administered 2022-09-27: 5 mg via INTRAVENOUS
  Filled 2022-09-24: qty 2

## 2022-09-24 MED ORDER — MELATONIN 3 MG PO TABS
3.0000 mg | ORAL_TABLET | Freq: Every evening | ORAL | Status: DC | PRN
  Filled 2022-09-24: qty 1

## 2022-09-24 MED ORDER — SPIRONOLACTONE 25 MG PO TABS
25.0000 mg | ORAL_TABLET | Freq: Every day | ORAL | Status: DC
  Administered 2022-09-24 – 2022-09-25 (×2): 25 mg via ORAL
  Filled 2022-09-24 (×2): qty 1

## 2022-09-24 MED ORDER — ALBUMIN HUMAN 25 % IV SOLN
25.0000 g | Freq: Four times a day (QID) | INTRAVENOUS | Status: AC
  Administered 2022-09-24: 25 g via INTRAVENOUS
  Filled 2022-09-24: qty 100

## 2022-09-24 MED ORDER — SODIUM CHLORIDE 0.9 % IV SOLN
2.0000 g | INTRAVENOUS | Status: DC
  Administered 2022-09-24 – 2022-09-28 (×5): 2 g via INTRAVENOUS
  Filled 2022-09-24 (×5): qty 20

## 2022-09-24 MED ORDER — MEDIHONEY WOUND/BURN DRESSING EX PSTE
1.0000 | PASTE | Freq: Every day | CUTANEOUS | Status: DC
  Administered 2022-09-25 – 2022-09-28 (×4): 1 via TOPICAL
  Filled 2022-09-24: qty 44

## 2022-09-24 MED ORDER — ACETAMINOPHEN 325 MG PO TABS
650.0000 mg | ORAL_TABLET | Freq: Four times a day (QID) | ORAL | Status: DC | PRN
  Administered 2022-09-24 – 2022-09-26 (×2): 650 mg via ORAL
  Filled 2022-09-24 (×3): qty 2

## 2022-09-24 MED ORDER — LIDOCAINE HCL 1 % IJ SOLN
INTRAMUSCULAR | Status: AC
  Filled 2022-09-24: qty 20

## 2022-09-24 MED ORDER — FENTANYL CITRATE PF 50 MCG/ML IJ SOSY
12.5000 ug | PREFILLED_SYRINGE | INTRAMUSCULAR | Status: DC | PRN
  Administered 2022-09-24: 25 ug via INTRAVENOUS
  Administered 2022-09-25: 12.5 ug via INTRAVENOUS
  Administered 2022-09-25 – 2022-09-28 (×10): 25 ug via INTRAVENOUS
  Filled 2022-09-24 (×14): qty 1

## 2022-09-24 MED ORDER — PANTOPRAZOLE SODIUM 40 MG PO TBEC
40.0000 mg | DELAYED_RELEASE_TABLET | Freq: Every day | ORAL | Status: DC
  Administered 2022-09-24 – 2022-09-25 (×2): 40 mg via ORAL
  Filled 2022-09-24 (×2): qty 1
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
## 2022-09-24 NOTE — Consult Note (Signed)
WOC Nurse Consult Note: Reason for Consult: R knee wound  Patient states she had knee surgery years ago, her knee was hurting and she placed a heating pad on the knee which left a burn.  Area has not healed, was given po antibiotics as an outpatient.  No local wound care recommended by outpatient provider.   Wound type: full thickness, former surgical wound   Pressure Injury POA: NA, not pressure  Measurement: 1.5 cms x 1 cm  Wound bed: 100% dry brown yellow devitalized tissue  Drainage (amount, consistency, odor) minimal serosanguinous on band aid that was covering  Periwound: well healed surgical scar superior to wound  Dressing procedure/placement/frequency: Clean R knee wound with NS, apply Medihoney to wound bed daily, cover with foam dressing.  May lift foam to replace Medihoney daily, change foam dressing q3 days and prn soiling.   Discussed POC with patient and bedside nurse.   WOC will not follow at this time.  Re-consult if further needs arise.   Thank you,    Shelton Silvas MSN, RN-BC, [employer] [phone]

## 2022-09-24 NOTE — Progress Notes (Signed)
Initial Nutrition Assessment  DOCUMENTATION CODES:   Not applicable  INTERVENTION:  "Low Sodium Nutrition Therapy" handout added to AVS Monitor diet tolerance, will add nutrition supplements as appropriate to augment PO intake  NUTRITION DIAGNOSIS:   Increased nutrient needs related to acute illness as evidenced by estimated needs.  GOAL:   Patient will meet greater than or equal to 90% of their needs  MONITOR:   PO intake, Labs, Weight trends, I & O's  REASON FOR ASSESSMENT:   Consult Assessment of nutrition requirement/status (NASH, Liver cirrhosis with large ascites, low-sodium diet)  ASSESSMENT:   Pt admitted with abdominal distension and discomfort. PMH significant for NASH cirrhosis, large ascites s/p multiple paracentesis, iron deficiency anemia, ventral hernia.  Prior OP paracentesis- 2/26 yield 6L, 3/11 yied 6.9L 3/22 s/p paracentesis yield 7L   Continues medical management of decompensated hepatic cirrhosis.   GI following. Plans to increase diuretic, recommend outpatient endoscopy for evaluation of varices d/t prior h/o variceal bleeding.   RD working remotely. Unsuccessful attempt to reach pt via phone call to room. Unable to obtain detailed nutrition related history at this time.   Meal completions: 100% x2 recorded meals (3/22)  There is limited documentation of weight history on file to review. Pt's admit weight is documented to be 75.8 kg but suspect this to be stated as it is consistent with weight on 3/9. D/t history of recurrent significant ascites, unclear of pt's dry weight. Suspect pt to have weight fluctuations r/t to this.   Medications: lasix 40mg  daily, medihoney, protonix, IV abx  Labs: Cr 1.05, GFR 58  NUTRITION - FOCUSED PHYSICAL EXAM: RD working remotely. Deferred to follow up.   Diet Order:   Diet Order             Diet 2 gram sodium Room service appropriate? Yes; Fluid consistency: Thin  Diet effective now                    EDUCATION NEEDS:   No education needs have been identified at this time  Skin:  Skin Assessment: Skin Integrity Issues: Skin Integrity Issues:: Other (Comment) Other: R knee burn: 1.5 cms x 1 cm  Last BM:  3/21  Height:   Ht Readings from Last 1 Encounters:  09/23/22 5\' 2"  (1.575 m)    Weight:   Wt Readings from Last 1 Encounters:  09/23/22 75.8 kg    Ideal Body Weight:  50 kg  BMI:  Body mass index is 30.54 kg/m.  Estimated Nutritional Needs:   Kcal:  1400-1600  Protein:  70-85g  Fluid:  >/=1.5L  [name], RDN, LDN Clinical Nutrition

## 2022-09-24 NOTE — ED Notes (Signed)
Patient transported to IR for Pericentesis.

## 2022-09-24 NOTE — ED Notes (Signed)
Patient ambulatory to rr and back

## 2022-09-24 NOTE — Procedures (Signed)
PROCEDURE SUMMARY:  Successful ultrasound guided paracentesis from the left  lower quadrant.  Yielded 7 liters of steaw colored fluid.  No immediate complications.  The patient tolerated the procedure well.   Specimen was sent for labs.  EBL < 64mL  The patient has required >/=2 paracenteses in a 30 day period and a screening evaluation by the [facility] has been arranged.

## 2022-09-24 NOTE — Progress Notes (Signed)
Started prior to midnight in the emergency room and patient was admitted early this morning after midnight by Dr. Irene Pap and I am in current agreement with our assessment and plan.  Additional changes to the plan of care been made accordingly.  The patient is a 67 year old female with a past medical history significant for #2 NASH cirrhosis with large ascites, iron deficiency anemia, ventral hernia as well as other comorbidities including irritable bowel syndrome who presented to the [facility] ED with chief complaints of abdominal distention and discomfort.  She had her first paracentesis on August 30, 2022 with nearly 6 L of abdominal fluid removed.  Return to the ED 12 days later and then on 09/13/2022 had recurrent large ascites.  She was referred to GI and had another paracentesis by IR 6.9 L of ascitic fluid and discharged home.  11 days later she returned again for the same plane and endorses abdominal distention as well as some abdominal discomfort with diffuse abdominal tenderness with palpation.  She is worked up and found to have decompensated liver cirrhosis with large ascites and a CT scan revealed changes cirrhosis associated splenomegaly and large volume ascites.  TRH was asked to admit this patient for further evaluation and IR was consulted for another large-volume paracentesis.  GI was consulted for further evaluation.  Currently she is being admitted and treated following but not limited to:  Decompensated Nash/hepatic cirrhosis with large ascites -Hx of prior 2 large volume paracentesis -CT scan of the abdomen pelvis done and showed "Changes of cirrhosis with associated splenomegaly and large volume ascites. Sigmoid diverticulosis. No acute findings." -IR consulted for a 3rd paracentesis and removed 7 L and will need further analysis of the fluid -Paracentesis therapeutic and diagnostic. -No reported nausea or vomiting. -IV albumin 25g q6h x 2 doses -Low-sodium diet.  Dietitian  consult for patient's education -Po lasix and po spironolactone initiated -INR 1.2 -Meld score 12, estimated less than 2% 90-day mortality. -GI consulted to assist with the management and appreciate their recommendations   Left Knee Burn with concern for developing cellulitis -Follow MRSA screening test and peripheral blood cultures x 2 -C/w Rocephin 2 g IV empirically.  -Analgesics as needed -Wound care Nurse Consulted and Local wound care with wound care specialist's guidance is now getting Medihoney   Presumptive UTI, POA -UA positive for pyuria -Initiated Rocephin 2 g daily -Follow urine culture for ID and sensitivities -Continue to Monitor fever curve and WBC  Metabolic Acidosis -Patient has a CO2 of 20, AG of 8, and Chloride Level of 109 -Continue to Monitor and Trend   GERD -Resume home PPI Substitution with Pantoprazole 40 mg Daily  Microcytic Anemia -Hgb/Hct Trend: Recent Labs  Lab 08/30/22 0859 09/11/22 1345 09/23/22 1550 09/24/22 1203  HGB 12.2 13.0 12.6 11.2*  HCT 35.9* 38.6 37.3 31.5*  MCV 80.1 80.4 81.1 78.6*  -Check Anemia Panel in the AM -Continue to Monitor for S/Sx of Bleeding; No overt bleeding noted -Repeat CBC in the AM   Thrombocytopenia -Platelet Count Trend: Recent Labs  Lab 08/30/22 0859 09/11/22 1345 09/23/22 1550 09/24/22 1203  PLT 70* 59* 73* 73*  -Continue to monitor for signs and symptoms bleeding; no overt bleeding noted -Repeat CBC in a.m.  Hypoalbuminemia -Patient's Albumin Trend: Recent Labs  Lab 08/30/22 0859 09/11/22 1345 09/23/22 1550  ALBUMIN 3.6 3.7 3.0*  -Continue to Monitor and Trend and repeat CMP in the AM  Obesity -Complicates overall prognosis and care -Estimated body mass index  is 30.54 kg/m as calculated from the following:   Height as of this encounter: 5\' 2"  (1.575 m).   Weight as of this encounter: 75.8 kg.  -Weight Loss and Dietary Counseling given  Will continue to monitor patient's clinical  response to intervention and follow-up on specialist recommendations

## 2022-09-24 NOTE — ED Notes (Signed)
Patient transported to IR 

## 2022-09-24 NOTE — Consult Note (Incomplete)
Consultation Note   Referring Provider:  Tr***iad Hospitalist PCP: [facility] Primary Gastroenterologist: [facility]        Reason for consultation: [facility] Day: 2   Assessment   67 yo female with the following:  Decompensated NASH cirrhosis with ascites requiring frequent LVPs. Also history of variceal bleed requiring EBL  MELD 3.0: 10 Na 137. Cr 0.98 INR 1.2 S/p 7 L  LVP today. No SBP   Possible UTI  History of H.pylori infection S/p treatment Stool h.pylori Ag testing for eradication was negative      Plan   - continue 2 gram NA restricted diet       History of Present Illness   Patient is a 67 y.o. year old female with a past medical history of  decompensated NASH cirrhosis with ascites,  variceal bleed s/p band ligation DM, IBS, HTN H.pylori infection  See PMH for any additional medical problems.  Patient was seen by GI at [facility] for cirrhosis follow up yesterday.      Patient presented to ED yesterday with abdominal swelling. Had a 6 L paracentesis done 2 weeks ago. ED labs notable for hgb 11.2, MCV 78. Normal INR    Previous GI History / Evaluation :     EGD 02/17/2019 - Normal duodenal bulb and second portion of the duodenum. - Portal hypertensive gastropathy. - Recently bleeding large (> 5 mm) esophageal varices. Incompletely eradicated. Banded. - No specimens collecte   Imaging:  02/23/22 CT ABD/PEL WITH IV CONTRAST  Report Status: Verified Date Reported: Feb 23, 2022   Report: History: early satiety/loss appetite   Comparison: February 19, 2020; June 09, 2017   Technique: CT of the abdomen and pelvis was performed after IV  administration of 100 cc Omnipaque 350. Coronal and sagittal  reformats were generated.   Findings:   No consolidative change or pleural effusion is seen in the lung  bases.   Cirrhosis and splenomegaly are again seen with sequela of portal   hypertension. An area of rounded hypoattenuation in the left  hepatic lobe on image 46, series 3 is unchanged. No foci of  enhancement or definite washout are identified. No worrisome  pancreatic lesion or dilatation of the biliary tree or pancreatic  duct is seen. The gallbladder is partially filled with multiple  tiny layering stones. Small lymph nodes are identified in the  gastrohepatic ligament with additional lymph nodes seen in the  retroperitoneum, measuring up to 10 mm in short axis. Small lymph  nodes are also seen within the central mesentery with central  mesenteric stranding. The largest central mesenteric lymph node  measures 11 mm in short axis, slightly more prominent than on the  prior study..   The right kidney shows no worrisome lesion; scarring is  identified over the anterior lower pole. The left kidney is  displaced medially and inferiorly by the splenomegaly.   The abdominal aorta shows no aneurysm formation. Atherosclerotic  changes are seen in the branches of the aorta.   A small hernia adjacent to the umbilicus contains fluid; the  sagittal image, image 80, series 602 demonstrates the  communication with the peritoneal cavity.   There is no evidence of bowel obstruction. Hyperattenuation  within the appendix suggests appendicolith formation. The uterus  is not seen. Areas of fluid are identified within the pelvis and  along the paracolic gutters.   Nerve stimulation hardware is seen in the left posterior gluteal  subcutaneous fat with a wire extending through a right-sided  sacral foramen.   The urinary bladder is unremarkable. There is a rounded area of  hyperattenuation along the left aspect of the labia; query  Bartholin's cyst formation.   No aggressive osseous lesion is seen. Mild degenerative changes  are seen in the spine. Grade 1 anterolisthesis of L3 on L4 is  present.    Impression: 1. Cirrhosis with sequela of portal hypertension  including  splenomegaly with increased ascites.   2. Cholelithiasis.   3. Stranding within the mesentery and prominent lymph nodes,  likely sequela of the previously mentioned portal hypertension.      Labs:   Recent Labs    09/23/22 1550 09/24/22 1203  WBC 4.5 4.1  HGB 12.6 11.2*  HCT 37.3 31.5*  PLT 73* 73*   Recent Labs    09/23/22 1550 09/24/22 1203  NA 137 137  K 4.0 3.6  CL 109 109  CO2 22 20*  GLUCOSE 135* 134*  BUN 16 15  CREATININE 1.02* 0.98  CALCIUM 8.6* 8.2*   Recent Labs    09/24/22 1203  PROT 6.0*  ALBUMIN 3.0*  AST 25  ALT 11  ALKPHOS 70  BILITOT 0.8   No results for input(s): "HEPBSAG", "HCVAB", "HEPAIGM", "HEPBIGM" in the last 72 hours. Recent Labs    09/23/22 1550  LABPROT 14.9  INR 1.2    Past Medical History:  Diagnosis Date   Hypertension    Iron deficiency anemia due to chronic blood loss 03/02/2019   Irritable bowel    Liver cirrhosis [facility])     Past Surgical History:  Procedure Laterality Date   ESOPHAGOGASTRODUODENOSCOPY N/A 02/17/2019   Procedure: ESOPHAGOGASTRODUODENOSCOPY (EGD);  Surgeon: Lin Landsman, MD;  Location: [facility] ENDOSCOPY;  Service: Gastroenterology;  Laterality: N/A;   ESOPHAGOGASTRODUODENOSCOPY (EGD) WITH PROPOFOL N/A 05/25/2019   Procedure: ESOPHAGOGASTRODUODENOSCOPY (EGD) WITH PROPOFOL;  Surgeon: Lin Landsman, MD;  Location: [facility] ENDOSCOPY;  Service: Gastroenterology;  Laterality: N/A;   IR PARACENTESIS  09/24/2022   SHOULDER ARTHROTOMY     TOTAL KNEE ARTHROPLASTY      Family History  Problem Relation Age of Onset   Osteoarthritis Mother    Cancer Father        cirrhosis; alcoholic   Cirrhosis Sister        alcoholic   Cirrhosis Brother        alcoholic    Prior to Admission medications   Medication Sig Start Date End Date Taking? Authorizing Provider  CALCIUM PO Take 1 tablet by mouth 2 (two) times daily.    [provider]  esomeprazole (NEXIUM) 40 MG capsule Take 1  capsule (40 mg total) by mouth 2 (two) times daily before a meal. 05/25/19 06/24/19  Vanga, Tally Due, MD  nadolol (CORGARD) 20 MG tablet Take 1 tablet (20 mg total) by mouth daily. 05/21/19 08/19/19  Lin Landsman, MD  ondansetron (ZOFRAN ODT) 4 MG disintegrating tablet Take 1 tablet (4 mg total) by mouth every 8 (eight) hours as needed for nausea or vomiting. 02/21/19   Gouru, Aruna, MD  POTASSIUM PO Take 1 tablet by mouth daily.    [provider]  PRESCRIPTION MEDICATION Take 1 tablet by mouth 2 (two) times daily.  [provider]  promethazine (PHENERGAN) 25 MG tablet Take 25 mg by mouth daily as needed. 04/15/22   [provider]  rizatriptan (MAXALT) 10 MG tablet Take by mouth. 04/19/22   [provider]  traMADol (ULTRAM) 50 MG tablet Take 1 tablet (50 mg total) by mouth daily as needed for moderate pain or severe pain. 04/13/19   Cammie Sickle, MD  verapamil (CALAN-SR) 120 MG CR tablet Take by mouth. 04/15/22   [provider]    Current Facility-Administered Medications  Medication Dose Route Frequency Provider Last Rate Last Admin   acetaminophen (TYLENOL) tablet 650 mg  650 mg Oral Q6H PRN Sheikh, Omair Latif, DO       albumin human 25 % solution 25 g  25 g Intravenous Q6H Irene Pap N, DO 60 mL/hr at 09/24/22 1148 Restarted at 09/24/22 1148   cefTRIAXone (ROCEPHIN) 2 g in sodium chloride 0.9 % 100 mL IVPB  2 g Intravenous Q24H Irene Pap N, DO   Stopped at 09/24/22 [id number]   furosemide (LASIX) tablet 20 mg  20 mg Oral Daily Irene Pap N, DO   20 mg at 09/24/22 1210   leptospermum manuka honey (MEDIHONEY) paste 1 Application  1 Application Topical Daily Sheikh, Omair Latif, DO       melatonin tablet 3 mg  3 mg Oral QHS PRN Irene Pap N, DO       oxyCODONE (Oxy IR/ROXICODONE) immediate release tablet 5 mg  5 mg Oral Q6H PRN Hall, Carole N, DO       polyethylene glycol (MIRALAX / GLYCOLAX) packet 17 g  17 g Oral Daily PRN  Irene Pap N, DO       prochlorperazine (COMPAZINE) injection 5 mg  5 mg Intravenous Q6H PRN Irene Pap N, DO       spironolactone (ALDACTONE) tablet 25 mg  25 mg Oral Daily Irene Pap N, DO   25 mg at 09/24/22 1211    Allergies as of 09/23/2022 - Review Complete 09/23/2022  Allergen Reaction Noted   Hydrocodone-acetaminophen Nausea And Vomiting 04/28/2012   Codeine Nausea And Vomiting 05/07/2017   Lactose intolerance (gi)  05/07/2017    Social History   Socioeconomic History   Marital status: Married    Spouse name: Not on file   Number of children: Not on file   Years of education: Not on file   Highest education level: Not on file  Occupational History   Not on file  Tobacco Use   Smoking status: Never   Smokeless tobacco: Never  Vaping Use   Vaping Use: Never used  Substance and Sexual Activity   Alcohol use: No   Drug use: No   Sexual activity: Not on file  Other Topics Concern   Not on file  Social History Narrative   Lives in [place] with husband; never smoked; no current alcohol; prior ocassional alcohol. [facility]   Social Determinants of Health   Financial Resource Strain: Not on file  Food Insecurity: Not on file  Transportation Needs: Not on file  Physical Activity: Not on file  Stress: Not on file  Social Connections: Not on file  Intimate Partner Violence: Not on file    Review of Systems: All systems reviewed and negative except where noted in HPI.  Physical Exam: Vital signs in last 24 hours: Temp:  [97.8 F (36.6 C)-99.1 F (37.3 C)] 98.7 F (37.1 C) (03/22 1126) Pulse Rate:  [59-77] 69 (03/22 1126) Resp:  [13-24] 17 (  03/22 1126) BP: (106-163)/(62-111) 139/79 (03/22 1126) SpO2:  [93 %-100 %] 100 % (03/22 1126) Weight:  [75.8 kg] 75.8 kg (03/21 1542) Last BM Date : 09/23/22  General:  Alert ***female in NAD Psych:  Pleasant, cooperative. Normal mood and affect Eyes: Pupils equal Ears:  Normal auditory acuity Nose: No  deformity, discharge or lesions Neck:  Supple, no masses felt Lungs:  Clear to auscultation.  Heart:  Regular rate, regular rhythm.  Abdomen:  Soft, nondistended, nontender, active bowel sounds, no masses felt Rectal :  Deferred Msk: Symmetrical without gross deformities.  Neurologic:  Alert, oriented, grossly normal neurologically Extremities : No edema Skin:  Intact without significant lesions.    Intake/Output from previous day: 03/21 0701 - 03/22 0700 In: 100 [IV Piggyback:100] Out: -  Intake/Output this shift:  Total I/O In: 240 [P.O.:240] Out: -     Principal Problem:   Decompensated hepatic cirrhosis ([place])    Tye Savoy, NP-C @  09/24/2022, 1:28 PM

## 2022-09-24 NOTE — ED Notes (Signed)
Pt ambulatory to rr

## 2022-09-24 NOTE — ED Notes (Signed)
ED TO INPATIENT HANDOFF REPORT  ED Nurse Name and Phone #: [phone] Vaneta Hammontree  S Name/Age/Gender [name] 67 y.o. female Room/Bed: 035C/035C  Code Status   Code Status: Full Code  Home/SNF/Other Home  Is this baseline? Yes   Triage Complete: Triage complete  Chief Complaint Decompensated hepatic cirrhosis ([place]) [K72.90, [id number]  Triage Note Pt reports abdominal swelling for a "couple of days." Hx of ascites and reports she had a paracentesis done 2 weeks ago and had 6L removed.   Allergies Allergies  Allergen Reactions   Hydrocodone-Acetaminophen Nausea And Vomiting   Codeine Nausea And Vomiting   Lactose Intolerance (Gi)     Level of Care/Admitting Diagnosis ED Disposition     ED Disposition  Admit   Condition  --   Comment  Hospital Area: [place] [[id number]]  Level of Care: Telemetry Medical [104]  May admit patient to [facility] or [facility] if equivalent level of care is available:: Yes  Covid Evaluation: Asymptomatic - no recent exposure (last 10 days) testing not required  Diagnosis: Decompensated hepatic cirrhosis [facility]) [id number]  Admitting Physician: Kayleen Memos [id number]  Attending Physician: Kayleen Memos [id number]  Certification:: I certify this patient will need inpatient services for at least 2 midnights  Estimated Length of Stay: 2          B Medical/Surgery History Past Medical History:  Diagnosis Date   Hypertension    Iron deficiency anemia due to chronic blood loss 03/02/2019   Irritable bowel    Liver cirrhosis ([place])    Past Surgical History:  Procedure Laterality Date   ESOPHAGOGASTRODUODENOSCOPY N/A 02/17/2019   Procedure: ESOPHAGOGASTRODUODENOSCOPY (EGD);  Surgeon: Lin Landsman, MD;  Location[facility] ENDOSCOPY;  Service: Gastroenterology;  Laterality: N/A;   ESOPHAGOGASTRODUODENOSCOPY (EGD) WITH PROPOFOL N/A 05/25/2019   Procedure: ESOPHAGOGASTRODUODENOSCOPY (EGD) WITH PROPOFOL;  Surgeon:  Lin Landsman, MD;  Location: [facility] ENDOSCOPY;  Service: Gastroenterology;  Laterality: N/A;   SHOULDER ARTHROTOMY     TOTAL KNEE ARTHROPLASTY       A IV Location/Drains/Wounds Patient Lines/Drains/Airways Status     Active Line/Drains/Airways     Name Placement date Placement time Site Days   Peripheral IV 09/23/22 18 G Left Antecubital 09/23/22  2357  Antecubital  1            Intake/Output Last 24 hours No intake or output data in the 24 hours ending 09/24/22 0202  Labs/Imaging Results for orders placed or performed during the hospital encounter of 09/23/22 (from the past 48 hour(s))  Urinalysis, Routine w reflex microscopic -Urine, Clean Catch     Status: Abnormal   Collection Time: 09/23/22  3:39 PM  Result Value Ref Range   Color, Urine AMBER (A) YELLOW    Comment: BIOCHEMICALS MAY BE AFFECTED BY COLOR   APPearance HAZY (A) CLEAR   Specific Gravity, Urine 1.023 1.005 - 1.030   pH 5.0 5.0 - 8.0   Glucose, UA NEGATIVE NEGATIVE mg/dL   Hgb urine dipstick NEGATIVE NEGATIVE   Bilirubin Urine NEGATIVE NEGATIVE   Ketones, ur NEGATIVE NEGATIVE mg/dL   Protein, ur NEGATIVE NEGATIVE mg/dL   Nitrite POSITIVE (A) NEGATIVE   Leukocytes,Ua TRACE (A) NEGATIVE   RBC / HPF 6-10 0 - 5 RBC/hpf   WBC, UA 11-20 0 - 5 WBC/hpf   Bacteria, UA MANY (A) NONE SEEN   Squamous Epithelial / HPF 6-10 0 - 5 /HPF   Mucus PRESENT    Ca Oxalate Crys, UA  PRESENT     Comment: Performed at [facility], [place] [address], [place], [place] [address]  Comprehensive metabolic panel     Status: Abnormal   Collection Time: 09/23/22  3:50 PM  Result Value Ref Range   Sodium 137 135 - 145 mmol/L   Potassium 4.0 3.5 - 5.1 mmol/L   Chloride 109 98 - 111 mmol/L   CO2 22 22 - 32 mmol/L   Glucose, Bld 135 (H) 70 - 99 mg/dL    Comment: Glucose reference range applies only to samples taken after fasting for at least 8 hours.   BUN 16 8 - 23 mg/dL   Creatinine, Ser 1.02 (H) 0.44 - 1.00 mg/dL    Calcium 8.6 (L) 8.9 - 10.3 mg/dL   Total Protein 6.7 6.5 - 8.1 g/dL   Albumin 3.0 (L) 3.5 - 5.0 g/dL   AST 31 15 - 41 U/L   ALT 16 0 - 44 U/L   Alkaline Phosphatase 90 38 - 126 U/L   Total Bilirubin 1.1 0.3 - 1.2 mg/dL   GFR, Estimated 60 (L) >60 mL/min    Comment: (NOTE) Calculated using the CKD-EPI Creatinine Equation (2021)    Anion gap 6 5 - 15    Comment: Performed at [facility], [place] [address], [place], [place] [address]  CBC with Differential     Status: Abnormal   Collection Time: 09/23/22  3:50 PM  Result Value Ref Range   WBC 4.5 4.0 - 10.5 K/uL   RBC 4.60 3.87 - 5.11 MIL/uL   Hemoglobin 12.6 12.0 - 15.0 g/dL   HCT 37.3 36.0 - 46.0 %   MCV 81.1 80.0 - 100.0 fL   MCH 27.4 26.0 - 34.0 pg   MCHC 33.8 30.0 - 36.0 g/dL   RDW 15.8 (H) 11.5 - 15.5 %   Platelets 73 (L) 150 - 400 K/uL    Comment: Immature Platelet Fraction may be clinically indicated, consider ordering this additional test [id number] REPEATED TO VERIFY PLATELET COUNT CONFIRMED BY SMEAR    nRBC 0.0 0.0 - 0.2 %   Neutrophils Relative % 75 %   Neutro Abs 3.4 1.7 - 7.7 K/uL   Lymphocytes Relative 16 %   Lymphs Abs 0.7 0.7 - 4.0 K/uL   Monocytes Relative 7 %   Monocytes Absolute 0.3 0.1 - 1.0 K/uL   Eosinophils Relative 2 %   Eosinophils Absolute 0.1 0.0 - 0.5 K/uL   Basophils Relative 0 %   Basophils Absolute 0.0 0.0 - 0.1 K/uL   Immature Granulocytes 0 %   Abs Immature Granulocytes 0.01 0.00 - 0.07 K/uL    Comment: Performed at [facility] [address] [place], [place] [address]  Protime-INR     Status: None   Collection Time: 09/23/22  3:50 PM  Result Value Ref Range   Prothrombin Time 14.9 11.4 - 15.2 seconds   INR 1.2 0.8 - 1.2    Comment: (NOTE) INR goal varies based on device and disease states. Performed at [facility], [place] [address] [place], [place] [address]    CT ABDOMEN PELVIS W CONTRAST  Result Date: 09/24/2022 CLINICAL DATA:  Abdominal distension, pain  EXAM: CT ABDOMEN AND PELVIS WITH CONTRAST TECHNIQUE: Multidetector CT imaging of the abdomen and pelvis was performed using the standard protocol following bolus administration of intravenous contrast. RADIATION DOSE REDUCTION: This exam was performed according to the departmental dose-optimization program which includes automated exposure control, adjustment of  the mA and/or kV according to patient size and/or use of iterative reconstruction technique. CONTRAST:  56mL OMNIPAQUE IOHEXOL 350 MG/ML SOLN COMPARISON:  08/30/2022 FINDINGS: Lower chest: No acute findings Hepatobiliary: Shrunken, nodular liver compatible with cirrhosis. Layering gallstones within the gallbladder. No focal hepatic abnormality. Pancreas: No focal abnormality or ductal dilatation. Spleen: Splenomegaly with a craniocaudal length of 22 cm. Adrenals/Urinary Tract: No adrenal abnormality. No focal renal abnormality. No stones or hydronephrosis. Urinary bladder is unremarkable. Stomach/Bowel: Sigmoid diverticulosis. No active diverticulitis. Stomach and small bowel decompressed, unremarkable. Vascular/Lymphatic: Shotty retroperitoneal lymph nodes, none pathologically enlarged. No evidence of aneurysm or adenopathy. Reproductive: Prior hysterectomy.  No adnexal masses. Other: Large volume ascites. Small umbilical hernia containing ascites. Musculoskeletal: No acute bony abnormality. IMPRESSION: Changes of cirrhosis with associated splenomegaly and large volume ascites. Sigmoid diverticulosis. No acute findings. Electronically Signed   By: Rolm Baptise M.D.   On: 09/24/2022 01:00   DG Chest 2 View  Result Date: 09/23/2022 CLINICAL DATA:  67 year old female presents for evaluation of shortness of breath. EXAM: CHEST - 2 VIEW COMPARISON:  February 16, 2019 FINDINGS: Cardiomediastinal contours and hilar structures are stable. No pneumothorax. No pleural effusion. On limited assessment no acute skeletal findings. IMPRESSION: No acute findings.  Electronically Signed   By: Zetta Bills M.D.   On: 09/23/2022 16:18    Pending Labs Unresulted Labs (From admission, onward)     Start     Ordered   09/24/22 0203  HIV Antibody (routine testing w rflx)  (HIV Antibody (Routine testing w reflex) panel)  Once,   R        09/24/22 0202            Vitals/Pain Today's Vitals   09/24/22 0039 09/24/22 0100 09/24/22 0115 09/24/22 0130  BP:  (!) 155/81 (!) 156/87 (!) 163/79  Pulse:  70 72 72  Resp:  15 16 15   Temp:      TempSrc:      SpO2:  99% 97% 99%  Weight:      Height:      PainSc: 4        Isolation Precautions No active isolations  Medications Medications  fentaNYL (SUBLIMAZE) injection 50 mcg (50 mcg Intravenous Given 09/23/22 2359)  ondansetron (ZOFRAN) injection 4 mg (4 mg Intravenous Given 09/24/22 0000)  iohexol (OMNIPAQUE) 350 MG/ML injection 75 mL (75 mLs Intravenous Contrast Given 09/24/22 0055)    Mobility walks     Focused Assessments   Patient here for sob and worsening ascites secondary to liver failure   R Recommendations: See Admitting Provider Note  Report given to:   Additional Notes:  Pt a/o x 4 here for sob secondary to abdominal ascites r/t liver failure ambulates with steady gait abdomen rigid tender distended needs paracentisis to remove excess fluid on room air vitals wnl

## 2022-09-25 DIAGNOSIS — K7290 Hepatic failure, unspecified without coma: Secondary | ICD-10-CM | POA: Diagnosis not present

## 2022-09-25 DIAGNOSIS — K921 Melena: Secondary | ICD-10-CM

## 2022-09-25 DIAGNOSIS — B9620 Unspecified Escherichia coli [E. coli] as the cause of diseases classified elsewhere: Secondary | ICD-10-CM

## 2022-09-25 DIAGNOSIS — N390 Urinary tract infection, site not specified: Secondary | ICD-10-CM

## 2022-09-25 DIAGNOSIS — K7460 Unspecified cirrhosis of liver: Secondary | ICD-10-CM | POA: Diagnosis not present

## 2022-09-25 LAB — CBC WITH DIFFERENTIAL/PLATELET
Abs Immature Granulocytes: 0.01 10*3/uL (ref 0.00–0.07)
Basophils Absolute: 0 10*3/uL (ref 0.0–0.1)
Basophils Relative: 0 %
Eosinophils Absolute: 0.1 10*3/uL (ref 0.0–0.5)
Eosinophils Relative: 2 %
HCT: 30.9 % — ABNORMAL LOW (ref 36.0–46.0)
Hemoglobin: 10.5 g/dL — ABNORMAL LOW (ref 12.0–15.0)
Immature Granulocytes: 0 %
Lymphocytes Relative: 21 %
Lymphs Abs: 0.9 10*3/uL (ref 0.7–4.0)
MCH: 27.3 pg (ref 26.0–34.0)
MCHC: 34 g/dL (ref 30.0–36.0)
MCV: 80.3 fL (ref 80.0–100.0)
Monocytes Absolute: 0.3 10*3/uL (ref 0.1–1.0)
Monocytes Relative: 7 %
Neutro Abs: 3.1 10*3/uL (ref 1.7–7.7)
Neutrophils Relative %: 70 %
Platelets: 77 10*3/uL — ABNORMAL LOW (ref 150–400)
RBC: 3.85 MIL/uL — ABNORMAL LOW (ref 3.87–5.11)
RDW: 16 % — ABNORMAL HIGH (ref 11.5–15.5)
WBC: 4.4 10*3/uL (ref 4.0–10.5)
nRBC: 0 % (ref 0.0–0.2)

## 2022-09-25 LAB — COMPREHENSIVE METABOLIC PANEL
ALT: 13 U/L (ref 0–44)
AST: 27 U/L (ref 15–41)
Albumin: 2.7 g/dL — ABNORMAL LOW (ref 3.5–5.0)
Alkaline Phosphatase: 73 U/L (ref 38–126)
Anion gap: 5 (ref 5–15)
BUN: 15 mg/dL (ref 8–23)
CO2: 21 mmol/L — ABNORMAL LOW (ref 22–32)
Calcium: 8 mg/dL — ABNORMAL LOW (ref 8.9–10.3)
Chloride: 109 mmol/L (ref 98–111)
Creatinine, Ser: 1.05 mg/dL — ABNORMAL HIGH (ref 0.44–1.00)
GFR, Estimated: 58 mL/min — ABNORMAL LOW (ref >60)
Glucose, Bld: 151 mg/dL — ABNORMAL HIGH (ref 70–99)
Potassium: 3.6 mmol/L (ref 3.5–5.1)
Sodium: 135 mmol/L (ref 135–145)
Total Bilirubin: 0.9 mg/dL (ref 0.3–1.2)
Total Protein: 5.6 g/dL — ABNORMAL LOW (ref 6.5–8.1)

## 2022-09-25 LAB — PHOSPHORUS: Phosphorus: 3.5 mg/dL (ref 2.5–4.6)

## 2022-09-25 LAB — HEMOGLOBIN AND HEMATOCRIT, BLOOD
HCT: 32.6 % — ABNORMAL LOW (ref 36.0–46.0)
HCT: 32.3 % — ABNORMAL LOW (ref 36.0–46.0)
Hemoglobin: 11.4 g/dL — ABNORMAL LOW (ref 12.0–15.0)
Hemoglobin: 10.9 g/dL — ABNORMAL LOW (ref 12.0–15.0)

## 2022-09-25 LAB — VITAMIN B12: Vitamin B-12: 554 pg/mL (ref 180–914)

## 2022-09-25 LAB — MAGNESIUM: Magnesium: 1.9 mg/dL (ref 1.7–2.4)

## 2022-09-25 MED ORDER — FUROSEMIDE 40 MG PO TABS
40.0000 mg | ORAL_TABLET | Freq: Every day | ORAL | Status: AC
  Administered 2022-09-26: 40 mg via ORAL
  Filled 2022-09-25: qty 1

## 2022-09-25 MED ORDER — SPIRONOLACTONE 25 MG PO TABS
50.0000 mg | ORAL_TABLET | Freq: Every day | ORAL | Status: AC
  Administered 2022-09-26: 50 mg via ORAL
  Filled 2022-09-25: qty 2

## 2022-09-25 MED ORDER — LIP MEDEX EX OINT
1.0000 | TOPICAL_OINTMENT | CUTANEOUS | Status: DC | PRN
  Administered 2022-09-25: 1 via TOPICAL
  Filled 2022-09-25: qty 7

## 2022-09-25 MED ORDER — PANTOPRAZOLE SODIUM 40 MG IV SOLR
40.0000 mg | Freq: Two times a day (BID) | INTRAVENOUS | Status: DC
  Administered 2022-09-25 – 2022-09-28 (×6): 40 mg via INTRAVENOUS
  Filled 2022-09-25 (×6): qty 10

## 2022-09-25 NOTE — Consult Note (Signed)
[facility] Consultation Note  Referring Provider: [facility] Primary Care Physician:  [facility]  Reason for Consultation:  cirrhosis  HPI: [name] is a 67 y.o. female admitted with ascites.  She reports having abdominal distention and, less so, lower extremity swelling, all starting about 2 months ago.  Has prior history of cirrhosis known > 10 years ago, felt to be due to NSAIDs(?).  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  Is on Nadolol.  No current or prior heavy alcohol use.   Past Medical History:  Diagnosis Date   Hypertension    Iron deficiency anemia due to chronic blood loss 03/02/2019   Irritable bowel    Liver cirrhosis [facility])     Past Surgical History:  Procedure Laterality Date   ESOPHAGOGASTRODUODENOSCOPY N/A 02/17/2019   Procedure: ESOPHAGOGASTRODUODENOSCOPY (EGD);  Surgeon: Lin Landsman, MD;  [facility] ENDOSCOPY;  Service: Gastroenterology;  Laterality: N/A;   ESOPHAGOGASTRODUODENOSCOPY (EGD) WITH PROPOFOL N/A 05/25/2019   Procedure: ESOPHAGOGASTRODUODENOSCOPY (EGD) WITH PROPOFOL;  Surgeon: Lin Landsman, MD;  Location: [facility] ENDOSCOPY;  Service: Gastroenterology;  Laterality: N/A;   IR PARACENTESIS  09/24/2022   SHOULDER ARTHROTOMY     TOTAL KNEE ARTHROPLASTY      Prior to Admission medications   Medication Sig Start Date End Date Taking? Authorizing Provider  acetaminophen (TYLENOL) 500 MG tablet Take 500 mg by mouth every 6 (six) hours as needed for moderate pain.   Yes [provider]  ondansetron (ZOFRAN ODT) 4 MG disintegrating tablet Take 1 tablet (4 mg total) by mouth every 8 (eight) hours as needed for nausea or vomiting. 02/21/19  Yes Gouru, Aruna, MD  traMADol (ULTRAM) 50 MG tablet Take 1 tablet (50 mg total) by mouth daily as needed for moderate pain or severe pain. 04/13/19  Yes Cammie Sickle, MD  esomeprazole (NEXIUM) 40 MG capsule Take 1  capsule (40 mg total) by mouth 2 (two) times daily before a meal. 05/25/19 06/24/19  Vanga, Tally Due, MD  nadolol (CORGARD) 20 MG tablet Take 1 tablet (20 mg total) by mouth daily. 05/21/19 08/19/19  Lin Landsman, MD    Current Facility-Administered Medications  Medication Dose Route Frequency Provider Last Rate Last Admin   acetaminophen (TYLENOL) tablet 650 mg  650 mg Oral Q6H PRN Raiford Noble [place], DO   650 mg at 09/24/22 1533   cefTRIAXone (ROCEPHIN) 2 g in sodium chloride 0.9 % 100 mL IVPB  2 g Intravenous Q24H Irene Pap N, DO 200 mL/hr at 09/25/22 0540 2 g at 09/25/22 0540   fentaNYL (SUBLIMAZE) injection 12.5-25 mcg  12.5-25 mcg Intravenous Q2H PRN Raiford Noble Latif, DO   25 mcg at 09/24/22 1824   furosemide (LASIX) tablet 20 mg  20 mg Oral Daily Irene Pap N, DO   20 mg at 09/25/22 0940   leptospermum manuka honey (MEDIHONEY) paste 1 Application  1 Application Topical Daily Raiford Noble [place], [place]   1 Application at [id number]   melatonin tablet 3 mg  3 mg Oral QHS PRN Irene Pap N, DO       oxyCODONE (Oxy IR/ROXICODONE) immediate release tablet 5 mg  5 mg Oral Q6H PRN Irene Pap N, DO       pantoprazole (PROTONIX) EC tablet 40 mg  40 mg Oral Daily Raiford Noble [place], DO   40 mg at 09/25/22 0940   polyethylene glycol (MIRALAX / GLYCOLAX) packet 17 g  17 g Oral Daily PRN  Irene Pap N, DO       prochlorperazine (COMPAZINE) injection 5 mg  5 mg Intravenous Q6H PRN Irene Pap N, DO       spironolactone (ALDACTONE) tablet 25 mg  25 mg Oral Daily Irene Pap N, DO   25 mg at 09/25/22 0940    Allergies as of 09/23/2022 - Review Complete 09/23/2022  Allergen Reaction Noted   Hydrocodone-acetaminophen Nausea And Vomiting 04/28/2012   Codeine Nausea And Vomiting 05/07/2017   Lactose intolerance (gi)  05/07/2017    Family History  Problem Relation Age of Onset   Osteoarthritis Mother    Cancer Father        cirrhosis; alcoholic   Cirrhosis Sister         alcoholic   Cirrhosis Brother        alcoholic    Social History   Socioeconomic History   Marital status: Married    Spouse name: Not on file   Number of children: Not on file   Years of education: Not on file   Highest education level: Not on file  Occupational History   Not on file  Tobacco Use   Smoking status: Never   Smokeless tobacco: Never  Vaping Use   Vaping Use: Never used  Substance and Sexual Activity   Alcohol use: No   Drug use: No   Sexual activity: Not on file  Other Topics Concern   Not on file  Social History Narrative   Lives in [place] with husband; never smoked; no current alcohol; prior ocassional alcohol. [facility]   Social Determinants of Health   Financial Resource Strain: Not on file  Food Insecurity: No Food Insecurity (09/24/2022)   Hunger Vital Sign    Worried About Running Out of Food in the Last Year: Never true    Ran Out of Food in the Last Year: Never true  Transportation Needs: No Transportation Needs (09/24/2022)   PRAPARE - Hydrologist (Medical): No    Lack of Transportation (Non-Medical): No  Physical Activity: Not on file  Stress: Not on file  Social Connections: Not on file  Intimate Partner Violence: Not At Risk (09/24/2022)   Humiliation, Afraid, Rape, and Kick questionnaire    Fear of Current or Ex-Partner: No    Emotionally Abused: No    Physically Abused: No    Sexually Abused: No    Review of Systems: As per HPI, all others negative  Physical Exam: Vital signs in last 24 hours: Temp:  [98 F (36.7 C)-98.9 F (37.2 C)] 98 F (36.7 C) (03/23 0832) Pulse Rate:  [69-86] 73 (03/23 0832) Resp:  [16-17] 16 (03/23 0832) BP: (101-139)/(59-79) 110/73 (03/23 0832) SpO2:  [95 %-100 %] 95 % (03/23 0832) Last BM Date : 09/23/22 General:   Alert,  Well-developed, well-nourished, pleasant and cooperative in NAD Head:  Normocephalic and atraumatic. Eyes:  Sclera clear, no icterus.    Conjunctiva pink. Ears:  Normal auditory acuity. Nose:  No deformity, discharge,  or lesions. Mouth:  No deformity or lesions.  Oropharynx pink & moist. Neck:  Supple; no masses or thyromegaly. Lungs:  No respiratory distress Abdomen:  Soft, nontender moderate distended; internal periumbilical hernia ~ 3 x 3 cm in size palpated; No masses, hepatosplenomegaly or hernias noted. Normal bowel sounds, without guarding, and without rebound.     Msk:  Symmetrical without gross deformities. Normal posture. Pulses:  Normal pulses noted. Extremities:  Without clubbing or edema. Neurologic:  Alert and  oriented x4;  grossly normal neurologically. Skin:  Intact without significant lesions or rashes. Psych:  Alert and cooperative. Normal mood and affect.   Lab Results: Recent Labs    09/23/22 1550 09/24/22 1203 09/25/22 0156  WBC 4.5 4.1 4.4  HGB 12.6 11.2* 10.5*  HCT 37.3 31.5* 30.9*  PLT 73* 73* 77*   BMET Recent Labs    09/23/22 1550 09/24/22 1203 09/25/22 0156  NA 137 137 135  K 4.0 3.6 3.6  CL 109 109 109  CO2 22 20* 21*  GLUCOSE 135* 134* 151*  BUN 16 15 15   CREATININE 1.02* 0.98 1.05*  CALCIUM 8.6* 8.2* 8.0*   LFT Recent Labs    09/25/22 0156  PROT 5.6*  ALBUMIN 2.7*  AST 27  ALT 13  ALKPHOS 73  BILITOT 0.9   PT/INR Recent Labs    09/23/22 1550  LABPROT 14.9  INR 1.2    Studies/Results: IR Paracentesis  Result Date: 09/24/2022 INDICATION: 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 Patient presents for therapeutic and diagnostic paracentesis EXAM: ULTRASOUND GUIDED THERAPEUTIC AND DIAGNOSTIC PARACENTESIS MEDICATIONS: Lidocaine 1% 10 mL COMPLICATIONS: None immediate. PROCEDURE: Informed written consent was obtained from the patient after a discussion of the risks, benefits and alternatives to treatment. A timeout was performed prior to the initiation of the procedure. Initial ultrasound scanning demonstrates a large amount of  ascites within the left lower abdominal quadrant. The left lower abdomen was prepped and draped in the usual sterile fashion. 1% lidocaine was used for local anesthesia. Following this, a 19 gauge, 7-cm, Yueh catheter was introduced. An ultrasound image was saved for documentation purposes. The paracentesis was performed. The catheter was removed and a dressing was applied. The patient tolerated the procedure well without immediate post procedural complication. Patient received post-procedure intravenous albumin; see nursing notes for details. FINDINGS: A total of approximately 7 L of straw-colored fluid was removed. Samples were sent to the laboratory as requested by the clinical team. IMPRESSION: Successful ultrasound-guided therapeutic and diagnostic paracentesis yielding 7 liters of peritoneal fluid. Read by: Rushie Nyhan, NP PLAN: The patient has required >/=2 paracenteses in a 30 day period and a formal evaluation by the [facility] has been arranged. Electronically Signed   By: Sandi Mariscal M.D.   On: 09/24/2022 12:53   CT ABDOMEN PELVIS W CONTRAST  Result Date: 09/24/2022 CLINICAL DATA:  Abdominal distension, pain EXAM: CT ABDOMEN AND PELVIS WITH CONTRAST TECHNIQUE: Multidetector CT imaging of the abdomen and pelvis was performed using the standard protocol following bolus administration of intravenous contrast. RADIATION DOSE REDUCTION: This exam was performed according to the departmental dose-optimization program which includes automated exposure control, adjustment of the mA and/or kV according to patient size and/or use of iterative reconstruction technique. CONTRAST:  48mL OMNIPAQUE IOHEXOL 350 MG/ML SOLN COMPARISON:  08/30/2022 FINDINGS: Lower chest: No acute findings Hepatobiliary: Shrunken, nodular liver compatible with cirrhosis. Layering gallstones within the gallbladder. No focal hepatic abnormality. Pancreas: No focal abnormality or  ductal dilatation. Spleen: Splenomegaly with a craniocaudal length of 22 cm. Adrenals/Urinary Tract: No adrenal abnormality. No focal renal abnormality. No stones or hydronephrosis. Urinary bladder is unremarkable. Stomach/Bowel: Sigmoid diverticulosis. No active diverticulitis. Stomach and small bowel decompressed, unremarkable. Vascular/Lymphatic: Shotty retroperitoneal lymph nodes, none pathologically enlarged. No evidence of aneurysm or adenopathy. Reproductive: Prior hysterectomy.  No adnexal masses. Other: Large volume ascites. Small umbilical hernia containing ascites. Musculoskeletal: No acute bony abnormality. IMPRESSION: Changes  of cirrhosis with associated splenomegaly and large volume ascites. Sigmoid diverticulosis. No acute findings. Electronically Signed   By: Rolm Baptise M.D.   On: 09/24/2022 01:00   DG Chest 2 View  Result Date: 09/23/2022 CLINICAL DATA:  67 year old female presents for evaluation of shortness of breath. EXAM: CHEST - 2 VIEW COMPARISON:  February 16, 2019 FINDINGS: Cardiomediastinal contours and hilar structures are stable. No pneumothorax. No pleural effusion. On limited assessment no acute skeletal findings. IMPRESSION: No acute findings. Electronically Signed   By: Zetta Bills M.D.   On: 09/23/2022 16:18    Impression:   Idiopathic cirrhosis (NSAID-mediated?; viral hepatitis negative; does not consume alcohol), decompensated (prior hematemesis due to varices with banding, multiple procedures done, in 2020) and now recent onset ascites.  Ascites SAAG > 1.1 (c/w portal hypertension) and no SBP.  Plan:   Low sodium (</= 2 grams per day) diet. Increase diuretics; she is on very low dose furosemide and spironolactone at present; increase furosemide to 40 mg po qd and increase spironolactone to 50 mg po qd, eventually, if tolerates from renal perspective, likely to need at least 60 mg po qd of furosemide and 100 mg po qd of spironolactone. Consideration of TIPS at this  point is premature, as patient has not come close to maximizing our attempts at medical therapy. Eventually (outpatient fine) she will need repeat endoscopy to reassess varices given prior variceal bleeding. Outpatient new patient consultation already scheduled with my partner Dr. Randel Pigg for early April. [facility] will follow.   LOS: 1 day   Wagner Tanzi M  09/25/2022, 11:02 AM  Cell [phone] If no answer or after 5 PM call [phone]

## 2022-09-25 NOTE — Discharge Instructions (Signed)

## 2022-09-25 NOTE — Progress Notes (Signed)
PROGRESS NOTE    [name]  [id number] DOB: Oct 20, 1955 DOA: 09/23/2022 PCP: [facility]   Brief Narrative:  The patient is a 67 year old female with a past medical history significant for #2 NASH cirrhosis with large ascites, iron deficiency anemia, ventral hernia as well as other comorbidities including irritable bowel syndrome who presented to the [facility] ED with chief complaints of abdominal distention and discomfort. She had her first paracentesis on August 30, 2022 with nearly 6 L of abdominal fluid removed. Return to the ED 12 days later and then on 09/13/2022 had recurrent large ascites. She was referred to GI and had another paracentesis by IR 6.9 L of ascitic fluid and discharged home. 11 days later she returned again for the same plane and endorses abdominal distention as well as some abdominal discomfort with diffuse abdominal tenderness with palpation. She is worked up and found to have decompensated liver cirrhosis with large ascites and a CT scan revealed changes cirrhosis associated splenomegaly and large volume ascites. TR[facility] was asked to admit this patient for further evaluation and IR was consulted for another large-volume paracentesis. GI was consulted for further evaluation.    Assessment and Plan:  Decompensated Nash/hepatic cirrhosis with large ascites -Hx of prior 2 large volume paracentesis -CT scan of the abdomen pelvis done and showed "Changes of cirrhosis with associated splenomegaly and large volume ascites. Sigmoid diverticulosis. No acute findings." -IR consulted for a 3rd paracentesis and removed 7 L and will need further analysis of the fluid -Paracentesis therapeutic and diagnostic. -No reported nausea or vomiting. -IV albumin 25g q6h x 2 doses -C/w Low-sodium diet.  Dietitian consult for patient's education and given the "Low Sodium Nutrition Therapy" handout -Po lasix and po spironolactone initiated and now GI is increasing her diuretics  and increased the furosemide to 40 g p.o. daily and increase to spironolactone 50 mg p.o. daily if she tolerates from a renal perspective though will need to titrate to at least 60 mg p.o. daily furosemide and 100 mg p.o. daily spironolactone -GI does not feel she needs a TIPS at this point and consideration of TIPS at this point is premature given that they want to try and maximize her attempts at medical therapy first -INR 1.2 -Meld score 12, estimated less than 2% 90-day mortality. -GI consulted to assist with the management and appreciate their recommendations  Dark black stools -Concern for GI bleed given her history of esophageal varices -Check FOBT -Monitor hemoglobin/hematocrit every 6h -Continue IV PPI as below   Left Knee Burn with concern for developing cellulitis -Follow MRSA screening test and peripheral blood cultures x 2 -C/w Rocephin 2 g IV empirically.  -Analgesics as needed -Wound care Nurse Consulted and Local wound care with wound care specialist's guidance is now getting Medihoney   E. coli UTI, present on admission -UA positive for pyuria and urinalysis showed hazy appearance with amber color urine, trace leukocytes, positive nitrites, many bacteria, 6-10 RBCs per high-power field, 6-10 squames bacterial cells, 1-20 WBCs and greater than 100,000 colony-forming units of E. coli with sensitivities pending -Initiated Rocephin 2 g daily -Follow urine culture for ID and sensitivities -Continue to Monitor fever curve and WBC   Metabolic Acidosis -Patient has a CO2 of 20, AG of 8, and Chloride Level of 109 on admission and now  -Continue to Monitor and Trend and repeat CMP in a.m.  Renal Insufficiency -In the setting of diuresis -BUN/Cr Trend: Recent Labs  Lab 08/30/22 0859 09/11/22 1345 09/23/22  1550 09/24/22 1203 09/25/22 0156  BUN 20 21 16 15 15   CREATININE 0.95 1.02* 1.02* 0.98 1.05*  -Avoid Nephrotoxic Medications if possible, Contrast Dyes, Hypotension and  Dehydration to Ensure Adequate Renal Perfusion and will need to Renally Adjust Meds but she remains on 40 mg of furosemide and 50 mg of spironolactone -Continue to Monitor and Trend Renal Function carefully and repeat CMP in the AM    GERD -Resumed home PPI Substitution with Pantoprazole 40 mg Daily however given her black stools now we will stop p.o. and start IV PPI with pantoprazole every 12 monitor for signs and symptoms of further bleeding   Microcytic Anemia -Hgb/Hct Trend: Recent Labs  Lab 08/30/22 0859 09/11/22 1345 09/23/22 1550 09/24/22 1203 09/25/22 0156  HGB 12.2 13.0 12.6 11.2* 10.5*  HCT 35.9* 38.6 37.3 31.5* 30.9*  MCV 80.1 80.4 81.1 78.6* 80.3  -Check Anemia Panel in the AM -Continue to Monitor for S/Sx of Bleeding; No overt bleeding noted -Repeat CBC in the AM    Thrombocytopenia -Platelet Count Trend: Recent Labs  Lab 08/30/22 0859 09/11/22 1345 09/23/22 1550 09/24/22 1203 09/25/22 0156  PLT 70* 59* 73* 73* 77*  -Continue to monitor for signs and symptoms bleeding; no overt bleeding noted -Repeat CBC in a.m.   Hypoalbuminemia -Patient's Albumin Trend: Recent Labs  Lab 08/30/22 0859 09/11/22 1345 09/23/22 1550 09/24/22 1203 09/25/22 0156  ALBUMIN 3.6 3.7 3.0* 3.0* 2.7*  -Continue to Monitor and Trend and repeat CMP in the AM   Obesity -Complicates overall prognosis and care -Estimated body mass index is 30.54 kg/m as calculated from the following:   Height as of this encounter: 5\' 2"  (1.575 m).   Weight as of this encounter: 75.8 kg.  -Weight Loss and Dietary Counseling given  DVT prophylaxis: SCDs Start: 09/24/22 0203    Code Status: Full Code Family Communication: Discussed with husband at bedside  Disposition Plan:  Level of care: Telemetry Medical Status is: Inpatient Remains inpatient appropriate because: Needs further clinical improvement and clearance by the GI physician; now having some dark black stools and will need to evaluate  for GI bleeding   Consultants:  Gastroenterology  Procedures:  As delineated as above  Antimicrobials:  Anti-infectives (From admission, onward)    Start     Dose/Rate Route Frequency Ordered Stop   09/24/22 0545  cefTRIAXone (ROCEPHIN) 2 g in sodium chloride 0.9 % 100 mL IVPB        2 g 200 mL/hr over 30 Minutes Intravenous Every 24 hours 09/24/22 0530         Subjective: Seen and examined at bedside and the patient was complaining some abdominal soreness and tenderness.  No nausea or vomiting.  States that anytime she tries to move her abdomen hurts.  Thinks belly is less swollen however states that she continues to hurt when she tries to get up to go urinate.  No other concerns or complaints at this time.  Objective: Vitals:   09/24/22 1707 09/24/22 2106 09/25/22 0546 09/25/22 0832  BP: 117/74 (!) 101/59 103/62 110/73  Pulse: 86 79 79 73  Resp: 17  16 16   Temp: 98.6 F (37 C) 98 F (36.7 C) 98.9 F (37.2 C) 98 F (36.7 C)  TempSrc: Oral Oral Oral Oral  SpO2: 98% 96% 96% 95%  Weight:      Height:        Intake/Output Summary (Last 24 hours) at 09/25/2022 1356 Last data filed at 09/25/2022 1346 Gross per 24  hour  Intake 641.52 ml  Output --  Net 641.52 ml   Filed Weights   09/23/22 1542  Weight: 75.8 kg   Examination: Physical Exam:  Constitutional: Chronically ill-appearing female with multiple tattoos noted in no acute distress but appears a little uncomfortable Respiratory: Diminished to auscultation bilaterally with some coarse breath sounds, no wheezing, rales, rhonchi or crackles. Normal respiratory effort and patient is not tachypenic. No accessory muscle use.  Unlabored breathing Cardiovascular: RRR, no murmurs / rubs / gallops. S1 and S2 auscultated.  No appreciable extremity edema Abdomen: Soft, overall tender to palpate.  Distended secondary to body habitus and ascites. Bowel sounds positive.  GU: Deferred. Musculoskeletal: No clubbing / cyanosis of  digits/nails. No joint deformity upper and lower extremities.   Skin: No rashes, lesions, ulcers limited skin evaluation but has multiple tattoos scattered throughout her body. No induration; Warm and dry.  Neurologic: CN 2-12 grossly intact with no focal deficits. Romberg sign and cerebellar reflexes not assessed.  Psychiatric: Normal judgment and insight. Alert and oriented x 3. Normal mood and appropriate affect.   Data Reviewed: I have personally reviewed following labs and imaging studies  CBC: Recent Labs  Lab 09/23/22 1550 09/24/22 1203 09/25/22 0156  WBC 4.5 4.1 4.4  NEUTROABS 3.4 2.9 3.1  HGB 12.6 11.2* 10.5*  HCT 37.3 31.5* 30.9*  MCV 81.1 78.6* 80.3  PLT 73* 73* 77*   Basic Metabolic Panel: Recent Labs  Lab 09/23/22 1550 09/24/22 1203 09/25/22 0156  NA 137 137 135  K 4.0 3.6 3.6  CL 109 109 109  CO2 22 20* 21*  GLUCOSE 135* 134* 151*  BUN 16 15 15   CREATININE 1.02* 0.98 1.05*  CALCIUM 8.6* 8.2* 8.0*  MG  --  1.8 1.9  PHOS  --  2.9 3.5   GFR: Estimated Creatinine Clearance: 48.9 mL/min (A) (by C-G formula based on SCr of 1.05 mg/dL (H)). Liver Function Tests: Recent Labs  Lab 09/23/22 1550 09/24/22 1203 09/25/22 0156  AST 31 25 27   ALT 16 11 13   ALKPHOS 90 70 73  BILITOT 1.1 0.8 0.9  PROT 6.7 6.0* 5.6*  ALBUMIN 3.0* 3.0* 2.7*   No results for input(s): "LIPASE", "AMYLASE" in the last 168 hours. No results for input(s): "AMMONIA" in the last 168 hours. Coagulation Profile: Recent Labs  Lab 09/23/22 1550  INR 1.2   Cardiac Enzymes: No results for input(s): "CKTOTAL", "CKMB", "CKMBINDEX", "TROPONINI" in the last 168 hours. BNP (last 3 results) No results for input(s): "PROBNP" in the last 8760 hours. HbA1C: No results for input(s): "HGBA1C" in the last 72 hours. CBG: No results for input(s): "GLUCAP" in the last 168 hours. Lipid Profile: No results for input(s): "CHOL", "HDL", "LDLCALC", "TRIG", "CHOLHDL", "LDLDIRECT" in the last 72  hours. Thyroid Function Tests: No results for input(s): "TSH", "T4TOTAL", "FREET4", "T3FREE", "THYROIDAB" in the last 72 hours. Anemia Panel: No results for input(s): "VITAMINB12", "FOLATE", "FERRITIN", "TIBC", "IRON", "RETICCTPCT" in the last 72 hours. Sepsis Labs: No results for input(s): "PROCALCITON", "LATICACIDVEN" in the last 168 hours.  Recent Results (from the past 240 hour(s))  Urine Culture (for pregnant, neutropenic or urologic patients or patients with an indwelling urinary catheter)     Status: Abnormal (Preliminary result)   Collection Time: 09/24/22  2:21 AM   Specimen: Urine, Clean Catch  Result Value Ref Range Status   Specimen Description URINE, CLEAN CATCH  Final   Special Requests NONE  Final   Culture (A)  Final    >=  100,000 COLONIES/mL ESCHERICHIA COLI SUSCEPTIBILITIES TO FOLLOW Performed at [facility] [address], [place], [place] [address]    Report Status PENDING  Incomplete  Culture, body fluid w Gram Stain-bottle     Status: None (Preliminary result)   Collection Time: 09/24/22  9:25 AM   Specimen: Peritoneal Washings  Result Value Ref Range Status   Specimen Description PERITONEAL  Final   Special Requests NONE  Final   Culture   Final    NO GROWTH < 24 HOURS Performed at [facility], [place] [address], [place], [place] [address]    Report Status PENDING  Incomplete  Gram stain     Status: None   Collection Time: 09/24/22  9:25 AM   Specimen: Peritoneal Washings  Result Value Ref Range Status   Specimen Description PERITONEAL  Final   Special Requests NONE  Final   Gram Stain   Final    NO WBC SEEN NO ORGANISMS SEEN CYTOSPIN SMEAR Performed at [facility], [address] [place], [place] [address]    Report Status 09/24/2022 FINAL  Final  Culture, blood (Routine X 2) w Reflex to ID Panel     Status: None (Preliminary result)   Collection Time: 09/24/22 11:50 AM   Specimen: BLOOD LEFT HAND  Result Value Ref Range  Status   Specimen Description BLOOD LEFT HAND  Final   Special Requests   Final    Immunocompromised BOTTLES DRAWN AEROBIC AND ANAEROBIC Blood Culture adequate volume   Culture   Final    NO GROWTH < 24 HOURS Performed at [facility], [place] [address] [place], [place] [address]    Report Status PENDING  Incomplete  Culture, blood (Routine X 2) w Reflex to ID Panel     Status: None (Preliminary result)   Collection Time: 09/24/22 11:50 AM   Specimen: BLOOD LEFT WRIST  Result Value Ref Range Status   Specimen Description BLOOD LEFT WRIST  Final   Special Requests   Final    Immunocompromised BOTTLES DRAWN AEROBIC AND ANAEROBIC Blood Culture adequate volume   Culture   Final    NO GROWTH < 24 HOURS Performed at [facility], [place] [address] [place], [place] [address]    Report Status PENDING  Incomplete  MRSA Next Gen by PCR, Nasal     Status: None   Collection Time: 09/24/22  1:10 PM   Specimen: Nasal Mucosa; Nasal Swab  Result Value Ref Range Status   MRSA by PCR Next Gen NOT DETECTED NOT DETECTED Final    Comment: (NOTE) The GeneXpert MRSA Assay (FDA approved for NASAL specimens only), is one component of a comprehensive MRSA colonization surveillance program. It is not intended to diagnose MRSA infection nor to guide or monitor treatment for MRSA infections. Test performance is not FDA approved in patients less than 85 years old. Performed at [facility], [place] [address], [place], [place] [address]     Radiology Studies: IR Paracentesis  Result Date: 09/24/2022 INDICATION: 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 Patient presents for therapeutic and diagnostic paracentesis EXAM: ULTRASOUND GUIDED THERAPEUTIC AND DIAGNOSTIC PARACENTESIS MEDICATIONS: Lidocaine 1% 10 mL COMPLICATIONS: None immediate. PROCEDURE: Informed written consent was obtained from the patient after a discussion of the risks, benefits and alternatives to  treatment. A timeout was performed prior to the initiation of the procedure. Initial ultrasound scanning demonstrates a large amount of ascites within  the left lower abdominal quadrant. The left lower abdomen was prepped and draped in the usual sterile fashion. 1% lidocaine was used for local anesthesia. Following this, a 19 gauge, 7-cm, Yueh catheter was introduced. An ultrasound image was saved for documentation purposes. The paracentesis was performed. The catheter was removed and a dressing was applied. The patient tolerated the procedure well without immediate post procedural complication. Patient received post-procedure intravenous albumin; see nursing notes for details. FINDINGS: A total of approximately 7 L of straw-colored fluid was removed. Samples were sent to the laboratory as requested by the clinical team. IMPRESSION: Successful ultrasound-guided therapeutic and diagnostic paracentesis yielding 7 liters of peritoneal fluid. Read by: Rushie Nyhan, NP PLAN: The patient has required >/=2 paracenteses in a 30 day period and a formal evaluation by the [facility] has been arranged. Electronically Signed   By: Sandi Mariscal M.D.   On: 09/24/2022 12:53   CT ABDOMEN PELVIS W CONTRAST  Result Date: 09/24/2022 CLINICAL DATA:  Abdominal distension, pain EXAM: CT ABDOMEN AND PELVIS WITH CONTRAST TECHNIQUE: Multidetector CT imaging of the abdomen and pelvis was performed using the standard protocol following bolus administration of intravenous contrast. RADIATION DOSE REDUCTION: This exam was performed according to the departmental dose-optimization program which includes automated exposure control, adjustment of the mA and/or kV according to patient size and/or use of iterative reconstruction technique. CONTRAST:  50mL OMNIPAQUE IOHEXOL 350 MG/ML SOLN COMPARISON:  08/30/2022 FINDINGS: Lower chest: No acute findings Hepatobiliary: Shrunken, nodular liver  compatible with cirrhosis. Layering gallstones within the gallbladder. No focal hepatic abnormality. Pancreas: No focal abnormality or ductal dilatation. Spleen: Splenomegaly with a craniocaudal length of 22 cm. Adrenals/Urinary Tract: No adrenal abnormality. No focal renal abnormality. No stones or hydronephrosis. Urinary bladder is unremarkable. Stomach/Bowel: Sigmoid diverticulosis. No active diverticulitis. Stomach and small bowel decompressed, unremarkable. Vascular/Lymphatic: Shotty retroperitoneal lymph nodes, none pathologically enlarged. No evidence of aneurysm or adenopathy. Reproductive: Prior hysterectomy.  No adnexal masses. Other: Large volume ascites. Small umbilical hernia containing ascites. Musculoskeletal: No acute bony abnormality. IMPRESSION: Changes of cirrhosis with associated splenomegaly and large volume ascites. Sigmoid diverticulosis. No acute findings. Electronically Signed   By: Rolm Baptise M.D.   On: 09/24/2022 01:00   DG Chest 2 View  Result Date: 09/23/2022 CLINICAL DATA:  67 year old female presents for evaluation of shortness of breath. EXAM: CHEST - 2 VIEW COMPARISON:  February 16, 2019 FINDINGS: Cardiomediastinal contours and hilar structures are stable. No pneumothorax. No pleural effusion. On limited assessment no acute skeletal findings. IMPRESSION: No acute findings. Electronically Signed   By: Zetta Bills M.D.   On: 09/23/2022 16:18    Scheduled Meds:  [START ON 09/26/2022] furosemide  40 mg Oral Daily   leptospermum manuka honey  1 Application Topical Daily   pantoprazole (PROTONIX) IV  40 mg Intravenous Q12H   [START ON 09/26/2022] spironolactone  50 mg Oral Daily   Continuous Infusions:  cefTRIAXone (ROCEPHIN)  IV Stopped (09/25/22 [id number])    LOS: 1 day   Raiford Noble, DO [facility] Available via Epic secure chat 7am-7pm After these hours, please refer to coverage provider listed on [email or web address] 09/25/2022, 1:56 PM

## 2022-09-25 NOTE — Plan of Care (Signed)

## 2022-09-26 DIAGNOSIS — R195 Other fecal abnormalities: Secondary | ICD-10-CM

## 2022-09-26 DIAGNOSIS — K7290 Hepatic failure, unspecified without coma: Secondary | ICD-10-CM | POA: Diagnosis not present

## 2022-09-26 DIAGNOSIS — E876 Hypokalemia: Secondary | ICD-10-CM

## 2022-09-26 LAB — URINE CULTURE: Culture: >=100000 — AB

## 2022-09-26 LAB — CBC WITH DIFFERENTIAL/PLATELET
Abs Immature Granulocytes: 0.01 10*3/uL (ref 0.00–0.07)
Basophils Absolute: 0 10*3/uL (ref 0.0–0.1)
Basophils Relative: 0 %
Eosinophils Absolute: 0.1 10*3/uL (ref 0.0–0.5)
Eosinophils Relative: 2 %
HCT: 30.5 % — ABNORMAL LOW (ref 36.0–46.0)
Hemoglobin: 10.3 g/dL — ABNORMAL LOW (ref 12.0–15.0)
Immature Granulocytes: 0 %
Lymphocytes Relative: 22 %
Lymphs Abs: 1 10*3/uL (ref 0.7–4.0)
MCH: 27 pg (ref 26.0–34.0)
MCHC: 33.8 g/dL (ref 30.0–36.0)
MCV: 80.1 fL (ref 80.0–100.0)
Monocytes Absolute: 0.3 10*3/uL (ref 0.1–1.0)
Monocytes Relative: 7 %
Neutro Abs: 3.2 10*3/uL (ref 1.7–7.7)
Neutrophils Relative %: 69 %
Platelets: 74 10*3/uL — ABNORMAL LOW (ref 150–400)
RBC: 3.81 MIL/uL — ABNORMAL LOW (ref 3.87–5.11)
RDW: 15.9 % — ABNORMAL HIGH (ref 11.5–15.5)
WBC: 4.6 10*3/uL (ref 4.0–10.5)
nRBC: 0 % (ref 0.0–0.2)

## 2022-09-26 LAB — COMPREHENSIVE METABOLIC PANEL
ALT: 12 U/L (ref 0–44)
AST: 23 U/L (ref 15–41)
Albumin: 2.7 g/dL — ABNORMAL LOW (ref 3.5–5.0)
Alkaline Phosphatase: 66 U/L (ref 38–126)
Anion gap: 6 (ref 5–15)
BUN: 14 mg/dL (ref 8–23)
CO2: 21 mmol/L — ABNORMAL LOW (ref 22–32)
Calcium: 8 mg/dL — ABNORMAL LOW (ref 8.9–10.3)
Chloride: 109 mmol/L (ref 98–111)
Creatinine, Ser: 0.99 mg/dL (ref 0.44–1.00)
GFR, Estimated: >60 mL/min (ref >60)
Glucose, Bld: 129 mg/dL — ABNORMAL HIGH (ref 70–99)
Potassium: 3.3 mmol/L — ABNORMAL LOW (ref 3.5–5.1)
Sodium: 136 mmol/L (ref 135–145)
Total Bilirubin: 1.3 mg/dL — ABNORMAL HIGH (ref 0.3–1.2)
Total Protein: 5.8 g/dL — ABNORMAL LOW (ref 6.5–8.1)

## 2022-09-26 LAB — HEMOGLOBIN AND HEMATOCRIT, BLOOD
HCT: 30.6 % — ABNORMAL LOW (ref 36.0–46.0)
HCT: 33.9 % — ABNORMAL LOW (ref 36.0–46.0)
HCT: 31.3 % — ABNORMAL LOW (ref 36.0–46.0)
Hemoglobin: 10.3 g/dL — ABNORMAL LOW (ref 12.0–15.0)
Hemoglobin: 11.4 g/dL — ABNORMAL LOW (ref 12.0–15.0)
Hemoglobin: 10.9 g/dL — ABNORMAL LOW (ref 12.0–15.0)

## 2022-09-26 LAB — FOLATE: Folate: 7.8 ng/mL (ref >5.9)

## 2022-09-26 LAB — RETICULOCYTES
Immature Retic Fract: 12 % (ref 2.3–15.9)
RBC.: 3.81 MIL/uL — ABNORMAL LOW (ref 3.87–5.11)
Retic Count, Absolute: 104.8 10*3/uL (ref 19.0–186.0)
Retic Ct Pct: 2.8 % (ref 0.4–3.1)

## 2022-09-26 LAB — MAGNESIUM: Magnesium: 1.9 mg/dL (ref 1.7–2.4)

## 2022-09-26 LAB — IRON AND TIBC
Iron: 35 ug/dL (ref 28–170)
Saturation Ratios: 23 % (ref 10.4–31.8)
TIBC: 150 ug/dL — ABNORMAL LOW (ref 250–450)
UIBC: 115 ug/dL

## 2022-09-26 LAB — FERRITIN: Ferritin: 440 ng/mL — ABNORMAL HIGH (ref 11–307)

## 2022-09-26 LAB — PHOSPHORUS: Phosphorus: 3.3 mg/dL (ref 2.5–4.6)

## 2022-09-26 MED ORDER — POTASSIUM CHLORIDE CRYS ER 20 MEQ PO TBCR
40.0000 meq | EXTENDED_RELEASE_TABLET | Freq: Two times a day (BID) | ORAL | Status: AC
  Administered 2022-09-26 (×2): 40 meq via ORAL
  Filled 2022-09-26 (×2): qty 2

## 2022-09-26 NOTE — Progress Notes (Signed)
   09/26/22 0027  Provider Notification  Provider Name/Title Miles Costain  Date Provider Notified 09/26/22  Time Provider Notified [phone]  Method of Notification Page (secure chat)  Notification Reason Other (Comment) (Renewal of telemetry order)  Provider response See new orders  Date of Provider Response 09/26/22  Time of Provider Response [phone]

## 2022-09-26 NOTE — H&P (View-Only) (Signed)
Subjective: Less abdominal distention. Is having some dark stools.  Objective: Vital signs in last 24 hours: Temp:  [97.8 F (36.6 C)-99.3 F (37.4 C)] 97.8 F (36.6 C) (03/24 0800) Pulse Rate:  [73-92] 73 (03/24 0800) Resp:  [17-20] 18 (03/24 0800) BP: (102-120)/(71-79) 102/72 (03/24 0800) SpO2:  [95 %-98 %] 98 % (03/24 0800) Weight change:  Last BM Date : 09/25/22  PE: GEN:  NAD NEURO:  A/O, no encephalopathy ABD:  Mild/mod abdominal distention, reducible umbilical hernia  Lab Results: CBC    Component Value Date/Time   WBC 4.6 09/26/2022 0153   RBC 3.81 (L) 09/26/2022 0153   RBC 3.81 (L) 09/26/2022 0153   HGB 10.3 (L) 09/26/2022 0730   HGB 12.2 04/09/2019 1439   HCT 30.6 (L) 09/26/2022 0730   HCT 37.4 04/09/2019 1439   PLT 74 (L) 09/26/2022 0153   PLT 55 (LL) 04/09/2019 1439   MCV 80.1 09/26/2022 0153   MCV 80 04/09/2019 1439   MCH 27.0 09/26/2022 0153   MCHC 33.8 09/26/2022 0153   RDW 15.9 (H) 09/26/2022 0153   RDW 18.2 (H) 04/09/2019 1439   LYMPHSABS 1.0 09/26/2022 0153   MONOABS 0.3 09/26/2022 0153   EOSABS 0.1 09/26/2022 0153   BASOSABS 0.0 09/26/2022 0153  CMP     Component Value Date/Time   NA 136 09/26/2022 0153   K 3.3 (L) 09/26/2022 0153   CL 109 09/26/2022 0153   CO2 21 (L) 09/26/2022 0153   GLUCOSE 129 (H) 09/26/2022 0153   BUN 14 09/26/2022 0153   CREATININE 0.99 09/26/2022 0153   CALCIUM 8.0 (L) 09/26/2022 0153   PROT 5.8 (L) 09/26/2022 0153   ALBUMIN 2.7 (L) 09/26/2022 0153   AST 23 09/26/2022 0153   ALT 12 09/26/2022 0153   ALKPHOS 66 09/26/2022 0153   BILITOT 1.3 (H) 09/26/2022 0153   GFRNONAA >60 09/26/2022 0153   GFRAA >60 02/13/2020 1024    Assessment:   Idiopathic cirrhosis (NSAID-mediated?; viral hepatitis negative; does not consume alcohol), decompensated (prior hematemesis due to varices with banding, multiple procedures done, in 2020) and now recent onset ascites.  Ascites SAAG > 1.1 (c/w portal hypertension) and no  SBP. Dark stools.  Slight drop Hgb.  BUN normal.  Not convinced this represents GI bleeding.  Plan:   Low sodium diet. Furosemide 40 mg po qd and spironolactone 50 mg po qd. Hold off on TIPS at this point; medical therapy for ascites has not been maximized. Endoscopy tomorrow for evaluation of anemia and dark stools; prior history of esophageal varices and banding. Risks (bleeding, infection, bowel perforation that could require surgery, sedation-related changes in cardiopulmonary systems), benefits (identification and possible treatment of source of symptoms, exclusion of certain causes of symptoms), and alternatives (watchful waiting, radiographic imaging studies, empiric medical treatment) of upper endoscopy (EGD) were explained to patient/family in detail and patient wishes to proceed.  Dr. Randel Pigg to follow tomorrow.   [name] 09/26/2022, 1:49 PM   Cell [phone] If no answer or after 5 PM call [phone]

## 2022-09-26 NOTE — Progress Notes (Signed)
PROGRESS NOTE    [name]  [id number] DOB: February 09, 1956 DOA: 09/23/2022 PCP: [facility]   Brief Narrative:  The patient is a 67 year old female with a past medical history significant for #2 NASH cirrhosis with large ascites, iron deficiency anemia, ventral hernia as well as other comorbidities including irritable bowel syndrome who presented to the [facility] ED with chief complaints of abdominal distention and discomfort. She had her first paracentesis on August 30, 2022 with nearly 6 L of abdominal fluid removed. Return to the ED 12 days later and then on 09/13/2022 had recurrent large ascites. She was referred to GI and had another paracentesis by IR 6.9 L of ascitic fluid and discharged home. 11 days later she returned again for the same plane and endorses abdominal distention as well as some abdominal discomfort with diffuse abdominal tenderness with palpation. She is worked up and found to have decompensated liver cirrhosis with large ascites and a CT scan revealed changes cirrhosis associated splenomegaly and large volume ascites. TRH was asked to admit this patient for further evaluation and IR was consulted for another large-volume paracentesis. GI was consulted for further evaluation and recommending continuing Furosemide 40 mg p.o. daily as well Spironolactone 50 mg p.o. daily and pursuing endoscopy tomorrow given her dark stools.  Assessment and Plan:  Decompensated Nash/hepatic cirrhosis with large Ascites Hyperbilirubinemia -Hx of prior 2 large volume paracentesis -CT scan of the abdomen pelvis done and showed "Changes of cirrhosis with associated splenomegaly and large volume ascites. Sigmoid diverticulosis. No acute findings." -IR consulted for a 3rd paracentesis and removed 7 L and will need further analysis of the fluid -Paracentesis therapeutic and diagnostic. -No reported nausea or vomiting. -IV albumin 25g q6h x 2 doses -C/w Low-sodium diet.  Dietitian  consult for patient's education and given the "Low Sodium Nutrition Therapy" handout -Po lasix and po spironolactone initiated and now GI is increasing her diuretics and increased the furosemide to 40 g p.o. daily and increase to spironolactone 50 mg p.o. daily if she tolerates from a renal perspective though will need to titrate to at least 60 mg p.o. daily furosemide and 100 mg p.o. daily spironolactone -GI does not feel she needs a TIPS at this point and consideration of TIPS at this point is premature given that they want to try and maximize her attempts at medical therapy first -INR 1.2 Recent Labs  Lab 08/30/22 0859 09/11/22 1345 09/23/22 1550 09/24/22 1203 09/25/22 0156 09/26/22 0153  BILITOT 2.4* 2.1* 1.1 0.8 0.9 1.3*  -Meld score 12, estimated less than 2% 90-day mortality. -GI consulted to assist with the management and appreciate their recommendations   Dark black stools -Concern for GI bleed given her history of esophageal varices -Check FOBT and pending -Monitor hemoglobin/hematocrit every 6h: See trend as below -Continue IV PPI as below -GI notified and they are not convinced that this represents GI bleeding but they are going to pursue an endoscopy for her evaluation of anemia and dark stools given that she has a prior history of esophageal varices and banding; Dr. Randel Pigg   Left Knee Burn with concern for developing cellulitis -Follow MRSA screening test and peripheral blood cultures x 2 -C/w Rocephin 2 g IV empirically.  -Analgesics as needed -Wound care Nurse Consulted and Local wound care with wound care specialist's guidance is now getting Medihoney   E. coli UTI, present on admission -UA positive for pyuria and urinalysis showed hazy appearance with amber color urine, trace leukocytes, positive nitrites,  many bacteria, 6-10 RBCs per high-power field, 6-10 squames bacterial cells, 1-20 WBCs and greater than 100,000 colony-forming units of E. coli with sensitivities  showing resistance to ampicillin and Unasyn -Initiated Rocephin 2 g daily -Follow urine culture for ID and sensitivities -Continue to Monitor fever curve and WBC   Metabolic Acidosis -Patient has a CO2 of 20, AG of 8, and Chloride Level of 109 on admission and now improved to having a CO2 of 21, anion gap of 6, chloride level of 109 -Continue to Monitor and Trend and repeat CMP in a.m.   Renal Insufficiency -In the setting of diuresis -BUN/Cr Trend: Recent Labs  Lab 08/30/22 0859 09/11/22 1345 09/23/22 1550 09/24/22 1203 09/25/22 0156 09/26/22 0153  BUN 20 21 16 15 15 14   CREATININE 0.95 1.02* 1.02* 0.98 1.05* 0.99  -Avoid Nephrotoxic Medications if possible, Contrast Dyes, Hypotension and Dehydration to Ensure Adequate Renal Perfusion and will need to Renally Adjust Meds but she remains on 40 mg of furosemide and 50 mg of spironolactone -Continue to Monitor and Trend Renal Function carefully and repeat CMP in the AM   Hypokalemia -Patient's K+ Level Trend: Recent Labs  Lab 08/30/22 0859 09/11/22 1345 09/23/22 1550 09/24/22 1203 09/25/22 0156 09/26/22 0153  K 3.8 4.0 4.0 3.6 3.6 3.3*  -Replete with po Kcl 40 mEQ BID x2 -Continue to Monitor and Replete as Necessary -Repeat CMP in the AM     GERD -Resumed home PPI Substitution with Pantoprazole 40 mg Daily however given her black stools now we will stop p.o. and start IV PPI with pantoprazole every 12 monitor for signs and symptoms of further bleeding   Microcytic Anemia -Hgb/Hct Trend: Recent Labs  Lab 09/23/22 1550 09/24/22 1203 09/25/22 0156 09/25/22 1519 09/25/22 2014 09/26/22 0153 09/26/22 0730  HGB 12.6 11.2* 10.5* 11.4* 10.9* 10.3* 10.3*  HCT 37.3 31.5* 30.9* 32.6* 32.3* 30.5* 30.6*  MCV 81.1 78.6* 80.3  --   --  80.1  --   -Checked Anemia Panel iron level of 35, UIBC 115, TIBC 150, saturation ratios of 20%, ferritin level 440, folate level 7.8, vitamin B12 554 -Continue to Monitor for S/Sx of Bleeding;  No overt bleeding noted -Repeat CBC in the AM    Thrombocytopenia -Platelet Count Trend: Recent Labs  Lab 08/30/22 0859 09/11/22 1345 09/23/22 1550 09/24/22 1203 09/25/22 0156 09/26/22 0153  PLT 70* 59* 73* 73* 77* 74*  -Continue to monitor for signs and symptoms bleeding; no overt bleeding noted -Repeat CBC in a.m.   Hypoalbuminemia -Patient's Albumin Trend: Recent Labs  Lab 08/30/22 0859 09/11/22 1345 09/23/22 1550 09/24/22 1203 09/25/22 0156 09/26/22 0153  ALBUMIN 3.6 3.7 3.0* 3.0* 2.7* 2.7*  -Continue to Monitor and Trend and repeat CMP in the AM   Obesity -Complicates overall prognosis and care -Estimated body mass index is 30.54 kg/m as calculated from the following:   Height as of this encounter: 5\' 2"  (1.575 m).   Weight as of this encounter: 75.8 kg.  -Weight Loss and Dietary Counseling given   DVT prophylaxis: SCDs Start: 09/24/22 0203    Code Status: Full Code Family Communication: No family currently at bedside  Disposition Plan:  Level of care: Telemetry Medical Status is: Inpatient Remains inpatient appropriate because: Needs further clinical improvement and clearance by the GI physician   Consultants:  Gastroenterology  Procedures:  Delineated as above and patient will be undergoing endoscopy in the morning  Antimicrobials:  Anti-infectives (From admission, onward)    Start  Dose/Rate Route Frequency Ordered Stop   09/24/22 0545  cefTRIAXone (ROCEPHIN) 2 g in sodium chloride 0.9 % 100 mL IVPB        2 g 200 mL/hr over 30 Minutes Intravenous Every 24 hours 09/24/22 0530         Subjective: Seen and examined at bedside and thinks her dark stools are getting little bit better but she states initially they are just "very dark black".  Continues to have some abdominal soreness and is less distended.  States that the soreness gets worse when she coughs.  No nausea or vomiting.  No lightheadedness or dizziness.  No other concerns or  complaints at this time.  Objective: Vitals:   09/25/22 1624 09/25/22 2058 09/26/22 0442 09/26/22 0800  BP: 120/79 111/75 110/71 102/72  Pulse: 85 92  73  Resp: 17 20  18   Temp: 98.2 F (36.8 C) 99.3 F (37.4 C)  97.8 F (36.6 C)  TempSrc: Oral Oral  Oral  SpO2: 95% 98% 98% 98%  Weight:      Height:        Intake/Output Summary (Last 24 hours) at 09/26/2022 1402 Last data filed at 09/26/2022 0559 Gross per 24 hour  Intake 360 ml  Output --  Net 360 ml   Filed Weights   09/23/22 1542  Weight: 75.8 kg   Examination: Physical Exam:  Constitutional: Chronically ill-appearing female with multiple tattoos noted in no acute distress and still appears mildly uncomfortable Respiratory: Diminished to auscultation bilaterally with some coarse breath sounds, no wheezing, rales, rhonchi or crackles. Normal respiratory effort and patient is not tachypenic. No accessory muscle use on the skin evaluation.  Cardiovascular: RRR, no murmurs / rubs / gallops. S1 and S2 auscultated.  No appreciable extremity edema Abdomen: Soft, mildly-tender to palpate, distended secondary to body habitus and has ascites noted. Bowel sounds positive.  GU: Deferred. Musculoskeletal: No clubbing / cyanosis of digits/nails. No joint deformity upper and lower extremities. Skin: No rashes, lesions, ulcers on limited skin evaluation as noted has multiple tattoos. No induration; Warm and dry.  Neurologic: CN 2-12 grossly intact with no focal deficits.  Romberg sign and cerebellar reflexes not assessed.  Psychiatric: Normal judgment and insight. Alert and oriented x 3. Normal mood and appropriate affect.   Data Reviewed: I have personally reviewed following labs and imaging studies  CBC: Recent Labs  Lab 09/23/22 1550 09/24/22 1203 09/25/22 0156 09/25/22 1519 09/25/22 2014 09/26/22 0153 09/26/22 0730  WBC 4.5 4.1 4.4  --   --  4.6  --   NEUTROABS 3.4 2.9 3.1  --   --  3.2  --   HGB 12.6 11.2* 10.5* 11.4*  10.9* 10.3* 10.3*  HCT 37.3 31.5* 30.9* 32.6* 32.3* 30.5* 30.6*  MCV 81.1 78.6* 80.3  --   --  80.1  --   PLT 73* 73* 77*  --   --  74*  --    Basic Metabolic Panel: Recent Labs  Lab 09/23/22 1550 09/24/22 1203 09/25/22 0156 09/26/22 0153  NA 137 137 135 136  K 4.0 3.6 3.6 3.3*  CL 109 109 109 109  CO2 22 20* 21* 21*  GLUCOSE 135* 134* 151* 129*  BUN 16 15 15 14   CREATININE 1.02* 0.98 1.05* 0.99  CALCIUM 8.6* 8.2* 8.0* 8.0*  MG  --  1.8 1.9 1.9  PHOS  --  2.9 3.5 3.3   GFR: Estimated Creatinine Clearance: 51.9 mL/min (by C-G formula based on SCr of 0.99  mg/dL). Liver Function Tests: Recent Labs  Lab 09/23/22 1550 09/24/22 1203 09/25/22 0156 09/26/22 0153  AST 31 25 27 23   ALT 16 11 13 12   ALKPHOS 90 70 73 66  BILITOT 1.1 0.8 0.9 1.3*  PROT 6.7 6.0* 5.6* 5.8*  ALBUMIN 3.0* 3.0* 2.7* 2.7*   No results for input(s): "LIPASE", "AMYLASE" in the last 168 hours. No results for input(s): "AMMONIA" in the last 168 hours. Coagulation Profile: Recent Labs  Lab 09/23/22 1550  INR 1.2   Cardiac Enzymes: No results for input(s): "CKTOTAL", "CKMB", "CKMBINDEX", "TROPONINI" in the last 168 hours. BNP (last 3 results) No results for input(s): "PROBNP" in the last 8760 hours. HbA1C: No results for input(s): "HGBA1C" in the last 72 hours. CBG: No results for input(s): "GLUCAP" in the last 168 hours. Lipid Profile: No results for input(s): "CHOL", "HDL", "LDLCALC", "TRIG", "CHOLHDL", "LDLDIRECT" in the last 72 hours. Thyroid Function Tests: No results for input(s): "TSH", "T4TOTAL", "FREET4", "T3FREE", "THYROIDAB" in the last 72 hours. Anemia Panel: Recent Labs    09/25/22 1519 09/26/22 0153  VITAMINB12 554  --   FOLATE  --  7.8  FERRITIN  --  440*  TIBC  --  150*  IRON  --  35  RETICCTPCT  --  2.8   Sepsis Labs: No results for input(s): "PROCALCITON", "LATICACIDVEN" in the last 168 hours.  Recent Results (from the past 240 hour(s))  Urine Culture (for pregnant,  neutropenic or urologic patients or patients with an indwelling urinary catheter)     Status: Abnormal   Collection Time: 09/24/22  2:21 AM   Specimen: Urine, Clean Catch  Result Value Ref Range Status   Specimen Description URINE, CLEAN CATCH  Final   Special Requests   Final    NONE Performed at [facility], [place] [address], [place], [place] [address]    Culture >=100,000 COLONIES/mL ESCHERICHIA COLI (A)  Final   Report Status 09/26/2022 FINAL  Final   Organism ID, Bacteria ESCHERICHIA COLI (A)  Final      Susceptibility   Escherichia coli - MIC*    AMPICILLIN >=32 RESISTANT Resistant     CEFAZOLIN <=4 SENSITIVE Sensitive     CEFEPIME <=0.12 SENSITIVE Sensitive     CEFTRIAXONE <=0.25 SENSITIVE Sensitive     CIPROFLOXACIN <=0.25 SENSITIVE Sensitive     GENTAMICIN <=1 SENSITIVE Sensitive     IMIPENEM <=0.25 SENSITIVE Sensitive     NITROFURANTOIN 32 SENSITIVE Sensitive     TRIMETH/SULFA <=20 SENSITIVE Sensitive     AMPICILLIN/SULBACTAM >=32 RESISTANT Resistant     PIP/TAZO 8 SENSITIVE Sensitive     * >=100,000 COLONIES/mL ESCHERICHIA COLI  Culture, body fluid w Gram Stain-bottle     Status: None (Preliminary result)   Collection Time: 09/24/22  9:25 AM   Specimen: Peritoneal Washings  Result Value Ref Range Status   Specimen Description PERITONEAL  Final   Special Requests NONE  Final   Culture   Final    NO GROWTH 2 DAYS Performed at [facility], [address] [place], [place] [address]    Report Status PENDING  Incomplete  Gram stain     Status: None   Collection Time: 09/24/22  9:25 AM   Specimen: Peritoneal Washings  Result Value Ref Range Status   Specimen Description PERITONEAL  Final   Special Requests NONE  Final   Gram Stain   Final    NO WBC SEEN NO ORGANISMS SEEN CYTOSPIN SMEAR Performed at  [facility], [place] [address] [place], [place] [address]    Report Status 09/24/2022 FINAL  Final  Culture, blood (Routine X 2) w Reflex to ID Panel      Status: None (Preliminary result)   Collection Time: 09/24/22 11:50 AM   Specimen: BLOOD LEFT HAND  Result Value Ref Range Status   Specimen Description BLOOD LEFT HAND  Final   Special Requests   Final    Immunocompromised BOTTLES DRAWN AEROBIC AND ANAEROBIC Blood Culture adequate volume   Culture   Final    NO GROWTH 2 DAYS Performed at [facility], [place] [address], [place], [place] [address]    Report Status PENDING  Incomplete  Culture, blood (Routine X 2) w Reflex to ID Panel     Status: None (Preliminary result)   Collection Time: 09/24/22 11:50 AM   Specimen: BLOOD LEFT WRIST  Result Value Ref Range Status   Specimen Description BLOOD LEFT WRIST  Final   Special Requests   Final    Immunocompromised BOTTLES DRAWN AEROBIC AND ANAEROBIC Blood Culture adequate volume   Culture   Final    NO GROWTH 2 DAYS Performed at [facility], [place] [address], [place], [place] [address]    Report Status PENDING  Incomplete  MRSA Next Gen by PCR, Nasal     Status: None   Collection Time: 09/24/22  1:10 PM   Specimen: Nasal Mucosa; Nasal Swab  Result Value Ref Range Status   MRSA by PCR Next Gen NOT DETECTED NOT DETECTED Final    Comment: (NOTE) The GeneXpert MRSA Assay (FDA approved for NASAL specimens only), is one component of a comprehensive MRSA colonization surveillance program. It is not intended to diagnose MRSA infection nor to guide or monitor treatment for MRSA infections. Test performance is not FDA approved in patients less than 24 years old. Performed at [facility], [place] [address], [place], [place] [address]     Radiology Studies: No results found.  Scheduled Meds:  leptospermum manuka honey  1 Application Topical Daily   pantoprazole (PROTONIX) IV  40 mg Intravenous Q12H   potassium chloride  40 mEq Oral BID   Continuous Infusions:  cefTRIAXone (ROCEPHIN)  IV 2 g (09/26/22 0559)    LOS: 2 days   Raiford Noble, DO [facility] Available via Epic secure chat 7am-7pm After these hours, please refer to coverage provider listed on [email or web address] 09/26/2022, 2:02 PM

## 2022-09-26 NOTE — Progress Notes (Signed)
Subjective: Less abdominal distention. Is having some dark stools.  Objective: Vital signs in last 24 hours: Temp:  [97.8 F (36.6 C)-99.3 F (37.4 C)] 97.8 F (36.6 C) (03/24 0800) Pulse Rate:  [73-92] 73 (03/24 0800) Resp:  [17-20] 18 (03/24 0800) BP: (102-120)/(71-79) 102/72 (03/24 0800) SpO2:  [95 %-98 %] 98 % (03/24 0800) Weight change:  Last BM Date : 09/25/22  PE: GEN:  NAD NEURO:  A/O, no encephalopathy ABD:  Mild/mod abdominal distention, reducible umbilical hernia  Lab Results: CBC    Component Value Date/Time   WBC 4.6 09/26/2022 0153   RBC 3.81 (L) 09/26/2022 0153   RBC 3.81 (L) 09/26/2022 0153   HGB 10.3 (L) 09/26/2022 0730   HGB 12.2 04/09/2019 1439   HCT 30.6 (L) 09/26/2022 0730   HCT 37.4 04/09/2019 1439   PLT 74 (L) 09/26/2022 0153   PLT 55 (LL) 04/09/2019 1439   MCV 80.1 09/26/2022 0153   MCV 80 04/09/2019 1439   MCH 27.0 09/26/2022 0153   MCHC 33.8 09/26/2022 0153   RDW 15.9 (H) 09/26/2022 0153   RDW 18.2 (H) 04/09/2019 1439   LYMPHSABS 1.0 09/26/2022 0153   MONOABS 0.3 09/26/2022 0153   EOSABS 0.1 09/26/2022 0153   BASOSABS 0.0 09/26/2022 0153  CMP     Component Value Date/Time   NA 136 09/26/2022 0153   K 3.3 (L) 09/26/2022 0153   CL 109 09/26/2022 0153   CO2 21 (L) 09/26/2022 0153   GLUCOSE 129 (H) 09/26/2022 0153   BUN 14 09/26/2022 0153   CREATININE 0.99 09/26/2022 0153   CALCIUM 8.0 (L) 09/26/2022 0153   PROT 5.8 (L) 09/26/2022 0153   ALBUMIN 2.7 (L) 09/26/2022 0153   AST 23 09/26/2022 0153   ALT 12 09/26/2022 0153   ALKPHOS 66 09/26/2022 0153   BILITOT 1.3 (H) 09/26/2022 0153   GFRNONAA >60 09/26/2022 0153   GFRAA >60 02/13/2020 1024    Assessment:   Idiopathic cirrhosis (NSAID-mediated?; viral hepatitis negative; does not consume alcohol), decompensated (prior hematemesis due to varices with banding, multiple procedures done, in 2020) and now recent onset ascites.  Ascites SAAG > 1.1 (c/w portal hypertension) and no  SBP. Dark stools.  Slight drop Hgb.  BUN normal.  Not convinced this represents GI bleeding.  Plan:   Low sodium diet. Furosemide 40 mg po qd and spironolactone 50 mg po qd. Hold off on TIPS at this point; medical therapy for ascites has not been maximized. Endoscopy tomorrow for evaluation of anemia and dark stools; prior history of esophageal varices and banding. Risks (bleeding, infection, bowel perforation that could require surgery, sedation-related changes in cardiopulmonary systems), benefits (identification and possible treatment of source of symptoms, exclusion of certain causes of symptoms), and alternatives (watchful waiting, radiographic imaging studies, empiric medical treatment) of upper endoscopy (EGD) were explained to patient/family in detail and patient wishes to proceed.  Dr. Randel Pigg to follow tomorrow.   Landry Dyke 09/26/2022, 1:49 PM   Cell [phone] If no answer or after 5 PM call [phone]

## 2022-09-27 ENCOUNTER — Encounter (HOSPITAL_COMMUNITY): Payer: Self-pay | Admitting: Internal Medicine

## 2022-09-27 ENCOUNTER — Inpatient Hospital Stay (HOSPITAL_COMMUNITY): Payer: No Typology Code available for payment source | Admitting: Anesthesiology

## 2022-09-27 ENCOUNTER — Inpatient Hospital Stay (HOSPITAL_COMMUNITY): Payer: No Typology Code available for payment source

## 2022-09-27 ENCOUNTER — Encounter (HOSPITAL_COMMUNITY)
Admission: EM | Disposition: A | Discharge status: Home / Self Care | Payer: Self-pay | Source: Home / Self Care | Attending: Internal Medicine

## 2022-09-27 DIAGNOSIS — K766 Portal hypertension: Secondary | ICD-10-CM

## 2022-09-27 DIAGNOSIS — K2950 Unspecified chronic gastritis without bleeding: Secondary | ICD-10-CM | POA: Diagnosis not present

## 2022-09-27 DIAGNOSIS — E876 Hypokalemia: Secondary | ICD-10-CM | POA: Diagnosis not present

## 2022-09-27 DIAGNOSIS — I8500 Esophageal varices without bleeding: Secondary | ICD-10-CM | POA: Diagnosis not present

## 2022-09-27 DIAGNOSIS — K259 Gastric ulcer, unspecified as acute or chronic, without hemorrhage or perforation: Secondary | ICD-10-CM

## 2022-09-27 DIAGNOSIS — K7290 Hepatic failure, unspecified without coma: Secondary | ICD-10-CM | POA: Diagnosis not present

## 2022-09-27 DIAGNOSIS — R195 Other fecal abnormalities: Secondary | ICD-10-CM | POA: Diagnosis not present

## 2022-09-27 DIAGNOSIS — M1990 Unspecified osteoarthritis, unspecified site: Secondary | ICD-10-CM

## 2022-09-27 DIAGNOSIS — D649 Anemia, unspecified: Secondary | ICD-10-CM

## 2022-09-27 HISTORY — PX: IR PARACENTESIS: IMG2679

## 2022-09-27 HISTORY — PX: BIOPSY: SHX5522

## 2022-09-27 HISTORY — PX: ESOPHAGOGASTRODUODENOSCOPY (EGD) WITH PROPOFOL: SHX5813

## 2022-09-27 LAB — PH, BODY FLUID: pH, Body Fluid: 7.5

## 2022-09-27 LAB — CYTOLOGY - NON PAP

## 2022-09-27 LAB — CBC WITH DIFFERENTIAL/PLATELET
Abs Immature Granulocytes: 0.01 10*3/uL (ref 0.00–0.07)
Basophils Absolute: 0 10*3/uL (ref 0.0–0.1)
Basophils Relative: 0 %
Eosinophils Absolute: 0.1 10*3/uL (ref 0.0–0.5)
Eosinophils Relative: 2 %
HCT: 29.8 % — ABNORMAL LOW (ref 36.0–46.0)
Hemoglobin: 10.6 g/dL — ABNORMAL LOW (ref 12.0–15.0)
Immature Granulocytes: 0 %
Lymphocytes Relative: 23 %
Lymphs Abs: 1 10*3/uL (ref 0.7–4.0)
MCH: 28 pg (ref 26.0–34.0)
MCHC: 35.6 g/dL (ref 30.0–36.0)
MCV: 78.6 fL — ABNORMAL LOW (ref 80.0–100.0)
Monocytes Absolute: 0.3 10*3/uL (ref 0.1–1.0)
Monocytes Relative: 7 %
Neutro Abs: 2.9 10*3/uL (ref 1.7–7.7)
Neutrophils Relative %: 68 %
Platelets: 73 10*3/uL — ABNORMAL LOW (ref 150–400)
RBC: 3.79 MIL/uL — ABNORMAL LOW (ref 3.87–5.11)
RDW: 15.9 % — ABNORMAL HIGH (ref 11.5–15.5)
WBC: 4.2 10*3/uL (ref 4.0–10.5)
nRBC: 0 % (ref 0.0–0.2)

## 2022-09-27 LAB — COMPREHENSIVE METABOLIC PANEL
ALT: 12 U/L (ref 0–44)
AST: 21 U/L (ref 15–41)
Albumin: 2.6 g/dL — ABNORMAL LOW (ref 3.5–5.0)
Alkaline Phosphatase: 66 U/L (ref 38–126)
Anion gap: 10 (ref 5–15)
BUN: 17 mg/dL (ref 8–23)
CO2: 20 mmol/L — ABNORMAL LOW (ref 22–32)
Calcium: 8.2 mg/dL — ABNORMAL LOW (ref 8.9–10.3)
Chloride: 105 mmol/L (ref 98–111)
Creatinine, Ser: 1 mg/dL (ref 0.44–1.00)
GFR, Estimated: >60 mL/min (ref >60)
Glucose, Bld: 119 mg/dL — ABNORMAL HIGH (ref 70–99)
Potassium: 4 mmol/L (ref 3.5–5.1)
Sodium: 135 mmol/L (ref 135–145)
Total Bilirubin: 1 mg/dL (ref 0.3–1.2)
Total Protein: 5.8 g/dL — ABNORMAL LOW (ref 6.5–8.1)

## 2022-09-27 LAB — BODY FLUID CELL COUNT WITH DIFFERENTIAL
Eos, Fluid: 0 %
Lymphs, Fluid: 28 %
Monocyte-Macrophage-Serous Fluid: 56 % (ref 50–90)
Neutrophil Count, Fluid: 16 % (ref 0–25)
Total Nucleated Cell Count, Fluid: 495 cu mm (ref 0–1000)

## 2022-09-27 LAB — PROTIME-INR
INR: 1.3 — ABNORMAL HIGH (ref 0.8–1.2)
Prothrombin Time: 16.1 seconds — ABNORMAL HIGH (ref 11.4–15.2)

## 2022-09-27 LAB — PROTEIN, PLEURAL OR PERITONEAL FLUID: Total protein, fluid: <3 g/dL

## 2022-09-27 LAB — ALBUMIN, PLEURAL OR PERITONEAL FLUID: Albumin, Fluid: <1.5 g/dL

## 2022-09-27 LAB — GLUCOSE, PLEURAL OR PERITONEAL FLUID: Glucose, Fluid: 110 mg/dL

## 2022-09-27 LAB — MAGNESIUM: Magnesium: 1.9 mg/dL (ref 1.7–2.4)

## 2022-09-27 LAB — PHOSPHORUS: Phosphorus: 3.3 mg/dL (ref 2.5–4.6)

## 2022-09-27 LAB — AMMONIA: Ammonia: 65 umol/L — ABNORMAL HIGH (ref 9–35)

## 2022-09-27 SURGERY — ESOPHAGOGASTRODUODENOSCOPY (EGD) WITH PROPOFOL
Anesthesia: Monitor Anesthesia Care

## 2022-09-27 MED ORDER — PROPOFOL 500 MG/50ML IV EMUL
INTRAVENOUS | Status: DC | PRN
  Administered 2022-09-27: 100 ug/kg/min via INTRAVENOUS

## 2022-09-27 MED ORDER — LIDOCAINE 2% (20 MG/ML) 5 ML SYRINGE
INTRAMUSCULAR | Status: DC | PRN
  Administered 2022-09-27: 70 mg via INTRAVENOUS

## 2022-09-27 MED ORDER — LIDOCAINE HCL 1 % IJ SOLN
INTRAMUSCULAR | Status: AC
  Filled 2022-09-27: qty 20

## 2022-09-27 MED ORDER — NADOLOL 20 MG PO TABS
20.0000 mg | ORAL_TABLET | Freq: Every day | ORAL | Status: DC
  Administered 2022-09-27: 20 mg via ORAL
  Filled 2022-09-27 (×2): qty 1

## 2022-09-27 MED ORDER — LACTATED RINGERS IV SOLN
INTRAVENOUS | Status: AC | PRN
  Administered 2022-09-27: 20 mL/h via INTRAVENOUS

## 2022-09-27 MED ORDER — PROPOFOL 10 MG/ML IV BOLUS
INTRAVENOUS | Status: DC | PRN
  Administered 2022-09-27: 10 mg via INTRAVENOUS
  Administered 2022-09-27: 20 mg via INTRAVENOUS

## 2022-09-27 SURGICAL SUPPLY — 15 items

## 2022-09-27 NOTE — Anesthesia Preprocedure Evaluation (Addendum)
Anesthesia Evaluation  Patient identified by MRN, date of birth, ID band Patient awake    Reviewed: Allergy & Precautions, NPO status , Patient's Chart, lab work & pertinent test results  Airway Mallampati: III  TM Distance: >3 FB Neck ROM: Full  Mouth opening: Limited Mouth Opening Comment: Pt slept in dentures recently and has some jaw pain from it Dental  (+) Edentulous Upper, Edentulous Lower   Pulmonary neg pulmonary ROS   Pulmonary exam normal breath sounds clear to auscultation       Cardiovascular (-) hypertension (111/68 preop)Normal cardiovascular exam Rhythm:Regular Rate:Normal     Neuro/Psych negative neurological ROS  negative psych ROS   GI/Hepatic ,,,(+) Cirrhosis   Esophageal Varices    Last scope 2020 had many bands GIB: melena   Endo/Other  negative endocrine ROS    Renal/GU negative Renal ROS  negative genitourinary   Musculoskeletal  (+) Arthritis , Osteoarthritis,    Abdominal   Peds  Hematology  (+) Blood dyscrasia, anemia Hb 10.6, plt 73   Anesthesia Other Findings   Reproductive/Obstetrics negative OB ROS                              Anesthesia Physical Anesthesia Plan  ASA: 3  Anesthesia Plan: MAC   Post-op Pain Management:    Induction:   PONV Risk Score and Plan: 2 and Propofol infusion and TIVA  Airway Management Planned: Natural Airway and Simple Face Mask  Additional Equipment: None  Intra-op Plan:   Post-operative Plan:   Informed Consent: I have reviewed the patients History and Physical, chart, labs and discussed the procedure including the risks, benefits and alternatives for the proposed anesthesia with the patient or authorized representative who has indicated his/her understanding and acceptance.       Plan Discussed with: CRNA  Anesthesia Plan Comments:          Anesthesia Quick Evaluation

## 2022-09-27 NOTE — Brief Op Note (Addendum)
09/27/2022  1:33 PM  PATIENT:  [name]  67 y.o. female  PRE-OPERATIVE DIAGNOSIS:  melena, anemia, cirrhosis, history of esophageal varices  POST-OPERATIVE DIAGNOSIS: small esophageal varices (2 columns) flattened with insufflation not amenable to esophageal band ligation, clean based gastric ulcerations in gastric antrum, portal hypertensive gastropathy mild, gastritis in gastric fundus  PROCEDURE:  Procedure(s): ESOPHAGOGASTRODUODENOSCOPY (EGD) WITH PROPOFOL (N/A) BIOPSY  SURGEON:  Surgeon(s) and Role:    Loney Laurence, DO - Primary  Recommendations: -Continue beta blocker therapy, repeat EGD for variceal surveillance in 1 year -Follow up pathology results -Monitor for signs of GI blood loss -Recommend oral PPI therapy once daily x 2 months (pantoprazole 40 mg PO daily) -Continue diuretic therapy, may require outpatient paracentesis, may require TIPS evaluation with Interventional Radiology in future -Advance diet as tolerated -Follow up as outpatient with [facility] in office -No further workup planned from gastroenterology standpoint, ok for discharge from GI standpoint -Gastroenterology team will respectfully sign off and be available for any questions that arise  Danton Clap, DO [facility]

## 2022-09-27 NOTE — Op Note (Signed)
[facility] Patient Name: [name] Procedure Date : 09/27/2022 MRN: [id number] Attending MD: Danton Clap DO, DO, [id number] Date of Birth: 05-02-1956 CSN: [id number] Age: 67 Admit Type: Inpatient Procedure:                Upper GI endoscopy Indications:              Iron deficiency anemia secondary to chronic blood                            loss, Follow-up of esophageal varices Providers:                Danton Clap DO, DO, Vladimir Crofts, RN, Brien Mates, Technician Referring MD:              Medicines:                See the Anesthesia note for documentation of the                            administered medications Complications:            No immediate complications. Estimated Blood Loss:     Estimated blood loss was minimal. Procedure:                Pre-Anesthesia Assessment:                           - ASA Grade Assessment: III - A patient with severe                            systemic disease.                           - The risks and benefits of the procedure and the                            sedation options and risks were discussed with the                            patient. All questions were answered and informed                            consent was obtained.                           After obtaining informed consent, the endoscope was                            passed under direct vision. Throughout the                            procedure, the patient's blood pressure, pulse, and                            oxygen saturations were  monitored continuously. The                            [id number] Olympus endoscope was introduced                            through the mouth, and advanced to the second part                            of duodenum. The upper GI endoscopy was                            accomplished without difficulty. The patient                            tolerated the procedure well. Scope  In: Scope Out: Findings:      Grade I varices were found in the lower third of the esophagus. They       were small in size and flattened with insufflation.      The Z-line was regular and was found 35 cm from the incisors.      Many non-bleeding cratered, linear and superficial gastric ulcers with a       clean ulcer base (Forrest Class III) were found in the gastric antrum.       The largest lesion was 4 mm in largest dimension. Biopsies were taken       with a cold forceps for Helicobacter pylori testing.      Diffuse mild inflammation characterized by congestion (edema) and       erosions was found in the gastric fundus.      Portal hypertensive gastropathy was found in the gastric body.      The examined duodenum was normal. Impression:               - Grade I esophageal varices.                           - Z-line regular, 35 cm from the incisors.                           - Non-bleeding gastric ulcers with a clean ulcer                            base (Forrest Class III). Biopsied.                           - Gastritis.                           - Portal hypertensive gastropathy.                           - Normal examined duodenum. Recommendation:           - Use Protonix (pantoprazole) 40 mg PO daily for 2                            months.                           -  Advance diet as tolerated.                           - Continue present medications.                           - Repeat upper endoscopy in 1 year for surveillance.                           - Return to GI office in 1 month. Procedure Code(s):        --- Professional ---                           [phone], Esophagogastroduodenoscopy, flexible,                            transoral; with biopsy, single or multiple Diagnosis Code(s):        --- Professional ---                           I85.00, Esophageal varices without bleeding                           K25.9, Gastric ulcer, unspecified as acute or                             chronic, without hemorrhage or perforation                           K29.70, Gastritis, unspecified, without bleeding                           K76.6, Portal hypertension CPT copyright 2022 American Medical Association. All rights reserved. The codes documented in this report are preliminary and upon coder review may  be revised to meet current compliance requirements. Dr Danton Clap, [place], DO 09/27/2022 2:05:25 PM Number of Addenda: 0

## 2022-09-27 NOTE — Progress Notes (Signed)
PROGRESS NOTE    [name]  [id number] DOB: [birth year] DOA: 09/23/2022 PCP: [facility]   Brief Narrative:  The patient is a 67 year old female with a past medical history significant for #2 NASH cirrhosis with large ascites, iron deficiency anemia, ventral hernia as well as other comorbidities including irritable bowel syndrome who presented to the [facility] ED with chief complaints of abdominal distention and discomfort. She had her first paracentesis on August 30, 2022 with nearly 6 L of abdominal fluid removed. Return to the ED 12 days later and then on 09/13/2022 had recurrent large ascites. She was referred to GI and had another paracentesis by IR 6.9 L of ascitic fluid and discharged home. 11 days later she returned again for the same plane and endorses abdominal distention as well as some abdominal discomfort with diffuse abdominal tenderness with palpation. She is worked up and found to have decompensated liver cirrhosis with large ascites and a CT scan revealed changes cirrhosis associated splenomegaly and large volume ascites. TR[facility] was asked to admit this patient for further evaluation and IR was consulted for another large-volume paracentesis. GI was consulted for further evaluation and recommending continuing Furosemide 40 mg p.o. daily as well Spironolactone 50 mg p.o. daily and pursuing endoscopy tomorrow given her dark stools.  Endoscopy done and showed "Grade I esophageal varices. Z-line regular, 35 cm from the incisors. Non-bleeding gastric ulcers with a clean ulcer base (Forrest Class III). Biopsied. Gastritis. Portal hypertensive gastropathy. Normal examined duodenum."  Gastroenterology is recommending p.o. Protonix for 2 months and continuing diuretics and resuming nadolol.  Will pursuing repeat paracentesis given her worsening abdominal distention.   Assessment and Plan:  Decompensated NASH/Hepatic Cirrhosis with large Ascites Hyperbilirubinemia -Hx of  prior 2 large volume paracentesis -CT scan of the abdomen pelvis done and showed "Changes of cirrhosis with associated splenomegaly and large volume ascites. Sigmoid diverticulosis. No acute findings." -IR consulted for a 3rd paracentesis and removed 7 L and will need further analysis of the fluid -Paracentesis therapeutic and diagnostic. -No reported nausea or vomiting. -IV albumin 25g q6h x 2 doses -C/w Low-sodium diet.  Dietitian consult for patient's education and given the "Low Sodium Nutrition Therapy" handout -Po lasix and po spironolactone initiated and now GI is increasing her diuretics and increased the furosemide to 40 g p.o. daily and increase to spironolactone 50 mg p.o. daily if she tolerates from a renal perspective though will need to titrate to at least 60 mg p.o. daily furosemide and 100 mg p.o. daily spironolactone eventually  -GI does not feel she needs a TIPS at this point and consideration of TIPS at this point is premature given that they want to try and maximize her attempts at medical therapy first -INR 1.2 Recent Labs  Lab 08/30/22 0859 09/11/22 1345 09/23/22 1550 09/24/22 1203 09/25/22 0156 09/26/22 0153 09/27/22 0259  BILITOT 2.4* 2.1* 1.1 0.8 0.9 1.3* 1.0  -Meld score 12, estimated less than 2% 90-day mortality. -GI consulted to assist with the management and appreciate their recommendations -See below -Continue to be volume overloaded so we will reevaluate a ultrasound for another repeat paracentesis prior to discharge but in the interim we will continue diuretics as above -Now Will resume Nadolol 20 mg po Daily    Dark Black Stools in the setting of gastric ulcers and gastritis -Concern for GI bleed given her history of esophageal varices -Check FOBT and pending -Monitor hemoglobin/hematocrit every 6h: See trend as below -Continue IV PPI as below -GI  notified and they are not convinced that this represents GI bleeding but they are going to pursue an  endoscopy for her evaluation of anemia and dark stools given that she has a prior history of esophageal varices and banding; -EGD done and showed "    - Grade I esophageal varices.                           - Z-line regular, 35 cm from the incisors.                           - Non-bleeding gastric ulcers with a clean ulcer                            base (Forrest Class III). Biopsied.                           - Gastritis.                           - Portal hypertensive gastropathy.                           - Normal examined duodenum." -GI is recommending p.o. Protonix 40 mg p.o. daily for 2 months and then repeating endoscopy in 1 year for surveillance   Left Knee Burn with concern for developing cellulitis -Follow MRSA screening test and peripheral blood cultures x 2 -C/w Rocephin 2 g IV empirically.  -Analgesics as needed -Wound care Nurse Consulted and Local wound care with wound care specialist's guidance is now getting Medihoney   E. coli UTI, present on admission -UA positive for pyuria and urinalysis showed hazy appearance with amber color urine, trace leukocytes, positive nitrites, many bacteria, 6-10 RBCs per high-power field, 6-10 squames bacterial cells, 1-20 WBCs and greater than 100,000 colony-forming units of E. coli with sensitivities showing resistance to ampicillin and Unasyn -Initiated Rocephin 2 g daily -Follow urine culture for ID and sensitivities and continue current treatment  -Continue to Monitor fever curve and WBC and transition to po closer to D/C    Metabolic Acidosis -Patient has a CO2 of 20, AG of 8, and Chloride Level of 109 on admission and now improved to having a CO2 of 21, anion gap of 6, chloride level of 109 -Continue to Monitor and Trend and repeat CMP in a.m.   Renal Insufficiency -In the setting of diuresis -BUN/Cr Trend: Recent Labs  Lab 08/30/22 0859 09/11/22 1345 09/23/22 1550 09/24/22 1203 09/25/22 0156 09/26/22 0153 09/27/22 0259   BUN 20 21 16 15 15 14 17   CREATININE 0.95 1.02* 1.02* 0.98 1.05* 0.99 1.00   -Avoid Nephrotoxic Medications if possible, Contrast Dyes, Hypotension and Dehydration to Ensure Adequate Renal Perfusion and will need to Renally Adjust Meds but she remains on 40 mg of furosemide and 50 mg of spironolactone -Continue to Monitor and Trend Renal Function carefully and repeat CMP in the AM    Hypokalemia -Patient's K+ Level Trend: Recent Labs  Lab 09/25/22 1519 09/25/22 2014 09/26/22 0153 09/26/22 0730 09/26/22 1428 09/26/22 2007 09/27/22 0259  HGB 11.4* 10.9* 10.3* 10.3* 11.4* 10.9* 10.6*  HCT 32.6* 32.3* 30.5* 30.6* 33.9* 31.3* 29.8*  MCV  --   --  80.1  --   --   --  78.6*  -Continue to Monitor and Replete as Necessary -Repeat CMP in the AM    GERD -Resumed home PPI Substitution with Pantoprazole 40 mg Daily however given her black stools now we will stop p.o. and start IV PPI with pantoprazole every 12 monitor for signs and symptoms of further bleeding -After EGD now GI recommending po Pantoprazole 40 mg Daily x2 months   Microcytic Anemia -Hgb/Hct Trend: Recent Labs  Lab 09/25/22 1519 09/25/22 2014 09/26/22 0153 09/26/22 0730 09/26/22 1428 09/26/22 2007 09/27/22 0259  HGB 11.4* 10.9* 10.3* 10.3* 11.4* 10.9* 10.6*  HCT 32.6* 32.3* 30.5* 30.6* 33.9* 31.3* 29.8*  MCV  --   --  80.1  --   --   --  78.6*  -Checked Anemia Panel iron level of 35, UIBC 115, TIBC 150, saturation ratios of 20%, ferritin level 440, folate level 7.8, vitamin B12 554 -Continue to Monitor for S/Sx of Bleeding; No overt bleeding noted -Repeat CBC in the AM    Thrombocytopenia -Platelet Count Trend: Recent Labs  Lab 08/30/22 0859 09/11/22 1345 09/23/22 1550 09/24/22 1203 09/25/22 0156 09/26/22 0153 09/27/22 0259  PLT 70* 59* 73* 73* 77* 74* 73*  -Continue to monitor for signs and symptoms bleeding; no overt bleeding noted -Repeat CBC in a.m.   Hypoalbuminemia -Patient's Albumin  Trend: Recent Labs  Lab 08/30/22 0859 09/11/22 1345 09/23/22 1550 09/24/22 1203 09/25/22 0156 09/26/22 0153 09/27/22 0259  ALBUMIN 3.6 3.7 3.0* 3.0* 2.7* 2.7* 2.6*  -Continue to Monitor and Trend and repeat CMP in the AM   Obesity -Complicates overall prognosis and care -Estimated body mass index is 30.54 kg/m as calculated from the following:   Height as of this encounter: 5\' 2"  (1.575 m).   Weight as of this encounter: 75.8 kg.  -Weight Loss and Dietary Counseling given   DVT prophylaxis: SCDs Start: 09/24/22 0203    Code Status: Full Code Family Communication: No family currently at bedside  Disposition Plan:  Level of care: Telemetry Medical Status is: Inpatient Remains inpatient appropriate because: Pending discharging home in next 24 to 48 hours given that we will likely get another repeat paracentesis and monitor for any further drop in hemoglobin and tolerance of diet   Consultants:  Gastroenterology   Procedures:  EGD Findings:      Grade I varices were found in the lower third of the esophagus. They       were small in size and flattened with insufflation.      The Z-line was regular and was found 35 cm from the incisors.      Many non-bleeding cratered, linear and superficial gastric ulcers with a       clean ulcer base (Forrest Class III) were found in the gastric antrum.       The largest lesion was 4 mm in largest dimension. Biopsies were taken       with a cold forceps for Helicobacter pylori testing.      Diffuse mild inflammation characterized by congestion (edema) and       erosions was found in the gastric fundus.      Portal hypertensive gastropathy was found in the gastric body.      The examined duodenum was normal. Impression:               - Grade I esophageal varices.                           -  Z-line regular, 35 cm from the incisors.                           - Non-bleeding gastric ulcers with a clean ulcer                            base  (Forrest Class III). Biopsied.                           - Gastritis.                           - Portal hypertensive gastropathy.                           - Normal examined duodenum. Recommendation:           - Use Protonix (pantoprazole) 40 mg PO daily for 2                            months.                           - Advance diet as tolerated.                           - Continue present medications.                           - Repeat upper endoscopy in 1 year for surveillance.                           - Return to GI office in 1 month.  Antimicrobials:  Anti-infectives (From admission, onward)    Start     Dose/Rate Route Frequency Ordered Stop   09/24/22 0545  cefTRIAXone (ROCEPHIN) 2 g in sodium chloride 0.9 % 100 mL IVPB        2 g 200 mL/hr over 30 Minutes Intravenous Every 24 hours 09/24/22 0530         Subjective: Seen and examined at bedside and she just come back from her EGD.  Complain of some lower abdominal discomfort.  Thinks her abdomen is a little bit more distended today.  Had a bowel movement but states it is now brown.  No chest pain or shortness of breath.  Denies any other concerns or complaints at this time.  Objective: Vitals:   09/27/22 1339 09/27/22 1350 09/27/22 1400 09/27/22 1410  BP: 94/64 95/61 95/67  111/73  Pulse: 68 65 65   Resp: 20 19 (!) 21   Temp: 97.9 F (36.6 C)     TempSrc: Temporal     SpO2: 95% 95% 96%   Weight:      Height:        Intake/Output Summary (Last 24 hours) at 09/27/2022 1542 Last data filed at 09/27/2022 1331 Gross per 24 hour  Intake 300 ml  Output --  Net 300 ml   Filed Weights   09/23/22 1542  Weight: 75.8 kg   Examination: Physical Exam:  Constitutional: Chronically ill-appearing female with multiple tattoos noted in no acute distress appears slightly uncomfortable complaining of lower  abdominal discomfort Respiratory: Diminished to auscultation bilaterally with coarse breath sounds, no wheezing, rales,  rhonchi or crackles. Normal respiratory effort and patient is not tachypenic. No accessory muscle use.  Unlabored breathing Cardiovascular: RRR, no murmurs / rubs / gallops. S1 and S2 auscultated.   Abdomen: Soft, somewhat tender to palpate, distended secondary to body habitus and ascites. Bowel sounds positive.  GU: Deferred. Musculoskeletal: No clubbing / cyanosis of digits/nails. No joint deformity upper and lower extremities.   Skin: No rashes, lesions, ulcers on limited skin evaluation. No induration; Warm and dry.  Neurologic: CN 2-12 grossly intact with no focal deficits. Romberg sign and cerebellar reflexes not assessed.  Psychiatric: Normal judgment and insight. Alert and oriented x 3. Normal mood and appropriate affect.   Data Reviewed: I have personally reviewed following labs and imaging studies  CBC: Recent Labs  Lab 09/23/22 1550 09/24/22 1203 09/25/22 0156 09/25/22 1519 09/26/22 0153 09/26/22 0730 09/26/22 1428 09/26/22 2007 09/27/22 0259  WBC 4.5 4.1 4.4  --  4.6  --   --   --  4.2  NEUTROABS 3.4 2.9 3.1  --  3.2  --   --   --  2.9  HGB 12.6 11.2* 10.5*   < > 10.3* 10.3* 11.4* 10.9* 10.6*  HCT 37.3 31.5* 30.9*   < > 30.5* 30.6* 33.9* 31.3* 29.8*  MCV 81.1 78.6* 80.3  --  80.1  --   --   --  78.6*  PLT 73* 73* 77*  --  74*  --   --   --  73*   < > = values in this interval not displayed.   Basic Metabolic Panel: Recent Labs  Lab 09/23/22 1550 09/24/22 1203 09/25/22 0156 09/26/22 0153 09/27/22 0259  NA 137 137 135 136 135  K 4.0 3.6 3.6 3.3* 4.0  CL 109 109 109 109 105  CO2 22 20* 21* 21* 20*  GLUCOSE 135* 134* 151* 129* 119*  BUN 16 15 15 14 17   CREATININE 1.02* 0.98 1.05* 0.99 1.00  CALCIUM 8.6* 8.2* 8.0* 8.0* 8.2*  MG  --  1.8 1.9 1.9 1.9  PHOS  --  2.9 3.5 3.3 3.3   GFR: Estimated Creatinine Clearance: 51.3 mL/min (by C-G formula based on SCr of 1 mg/dL). Liver Function Tests: Recent Labs  Lab 09/23/22 1550 09/24/22 1203 09/25/22 0156  09/26/22 0153 09/27/22 0259  AST 31 25 27 23 21   ALT 16 11 13 12 12   ALKPHOS 90 70 73 66 66  BILITOT 1.1 0.8 0.9 1.3* 1.0  PROT 6.7 6.0* 5.6* 5.8* 5.8*  ALBUMIN 3.0* 3.0* 2.7* 2.7* 2.6*   No results for input(s): "LIPASE", "AMYLASE" in the last 168 hours. Recent Labs  Lab 09/27/22 0259  AMMONIA 65*   Coagulation Profile: Recent Labs  Lab 09/23/22 1550 09/27/22 0259  INR 1.2 1.3*   Cardiac Enzymes: No results for input(s): "CKTOTAL", "CKMB", "CKMBINDEX", "TROPONINI" in the last 168 hours. BNP (last 3 results) No results for input(s): "PROBNP" in the last 8760 hours. HbA1C: No results for input(s): "HGBA1C" in the last 72 hours. CBG: No results for input(s): "GLUCAP" in the last 168 hours. Lipid Profile: No results for input(s): "CHOL", "HDL", "LDLCALC", "TRIG", "CHOLHDL", "LDLDIRECT" in the last 72 hours. Thyroid Function Tests: No results for input(s): "TSH", "T4TOTAL", "FREET4", "T3FREE", "THYROIDAB" in the last 72 hours. Anemia Panel: Recent Labs    09/25/22 1519 09/26/22 0153  VITAMINB12 554  --   FOLATE  --  7.8  FERRITIN  --  440*  TIBC  --  150*  IRON  --  35  RETICCTPCT  --  2.8   Sepsis Labs: No results for input(s): "PROCALCITON", "LATICACIDVEN" in the last 168 hours.  Recent Results (from the past 240 hour(s))  Urine Culture (for pregnant, neutropenic or urologic patients or patients with an indwelling urinary catheter)     Status: Abnormal   Collection Time: 09/24/22  2:21 AM   Specimen: Urine, Clean Catch  Result Value Ref Range Status   Specimen Description URINE, CLEAN CATCH  Final   Special Requests   Final    NONE Performed at [facility], [place] [address], [place], [place] [address]    Culture >=100,000 COLONIES/mL ESCHERICHIA COLI (A)  Final   Report Status 09/26/2022 FINAL  Final   Organism ID, Bacteria ESCHERICHIA COLI (A)  Final      Susceptibility   Escherichia coli - MIC*    AMPICILLIN >=32 RESISTANT Resistant     CEFAZOLIN  <=4 SENSITIVE Sensitive     CEFEPIME <=0.12 SENSITIVE Sensitive     CEFTRIAXONE <=0.25 SENSITIVE Sensitive     CIPROFLOXACIN <=0.25 SENSITIVE Sensitive     GENTAMICIN <=1 SENSITIVE Sensitive     IMIPENEM <=0.25 SENSITIVE Sensitive     NITROFURANTOIN 32 SENSITIVE Sensitive     TRIMETH/SULFA <=20 SENSITIVE Sensitive     AMPICILLIN/SULBACTAM >=32 RESISTANT Resistant     PIP/TAZO 8 SENSITIVE Sensitive     * >=100,000 COLONIES/mL ESCHERICHIA COLI  Culture, body fluid w Gram Stain-bottle     Status: None (Preliminary result)   Collection Time: 09/24/22  9:25 AM   Specimen: Peritoneal Washings  Result Value Ref Range Status   Specimen Description PERITONEAL  Final   Special Requests NONE  Final   Culture   Final    NO GROWTH 3 DAYS Performed at [facility], [address] [place], [place] [address]    Report Status PENDING  Incomplete  Gram stain     Status: None   Collection Time: 09/24/22  9:25 AM   Specimen: Peritoneal Washings  Result Value Ref Range Status   Specimen Description PERITONEAL  Final   Special Requests NONE  Final   Gram Stain   Final    NO WBC SEEN NO ORGANISMS SEEN CYTOSPIN SMEAR Performed at [facility], [address], [place], [place] [address]    Report Status 09/24/2022 FINAL  Final  Culture, blood (Routine X 2) w Reflex to ID Panel     Status: None (Preliminary result)   Collection Time: 09/24/22 11:50 AM   Specimen: BLOOD LEFT HAND  Result Value Ref Range Status   Specimen Description BLOOD LEFT HAND  Final   Special Requests   Final    Immunocompromised BOTTLES DRAWN AEROBIC AND ANAEROBIC Blood Culture adequate volume   Culture   Final    NO GROWTH 3 DAYS Performed at [facility], [place] [address] [place],  [address]    Report Status PENDING  Incomplete  Culture, blood (Routine X 2) w Reflex to ID Panel     Status: None (Preliminary result)   Collection Time: 09/24/22 11:50 AM   Specimen: BLOOD LEFT WRIST  Result Value  Ref Range Status   Specimen Description BLOOD LEFT WRIST  Final   Special Requests   Final    Immunocompromised BOTTLES DRAWN AEROBIC AND ANAEROBIC Blood Culture adequate volume   Culture   Final  NO GROWTH 3 DAYS Performed at [facility], [place] [address] [place], [place] [address]    Report Status PENDING  Incomplete  MRSA Next Gen by PCR, Nasal     Status: None   Collection Time: 09/24/22  1:10 PM   Specimen: Nasal Mucosa; Nasal Swab  Result Value Ref Range Status   MRSA by PCR Next Gen NOT DETECTED NOT DETECTED Final    Comment: (NOTE) The GeneXpert MRSA Assay (FDA approved for NASAL specimens only), is one component of a comprehensive MRSA colonization surveillance program. It is not intended to diagnose MRSA infection nor to guide or monitor treatment for MRSA infections. Test performance is not FDA approved in patients less than 9 years old. Performed at [facility], [place] [address], [place], [place] [address]     Radiology Studies: No results found.  Scheduled Meds:  leptospermum manuka honey  1 Application Topical Daily   pantoprazole (PROTONIX) IV  40 mg Intravenous Q12H   Continuous Infusions:  cefTRIAXone (ROCEPHIN)  IV 2 g (09/27/22 0459)    LOS: 3 days   Raiford Noble, DO [facility] Available via Epic secure chat 7am-7pm After these hours, please refer to coverage provider listed on [email or web address] 09/27/2022, 3:42 PM

## 2022-09-27 NOTE — Transfer of Care (Signed)
Immediate Anesthesia Transfer of Care Note  Patient: [name]  Procedure(s) Performed: ESOPHAGOGASTRODUODENOSCOPY (EGD) WITH PROPOFOL BIOPSY  Patient Location: Endoscopy Unit  Anesthesia Type:MAC  Level of Consciousness: drowsy  Airway & Oxygen Therapy: Patient Spontanous Breathing and Patient connected to nasal cannula oxygen  Post-op Assessment: Report given to RN and Post -op Vital signs reviewed and stable  Post vital signs: Reviewed and stable  Last Vitals: see endo vitals Vitals Value Taken Time  BP    Temp    Pulse    Resp    SpO2      Last Pain:  Vitals:   09/27/22 1139  TempSrc:   PainSc: 8       Patients Stated Pain Goal: 2 ([id number])  Complications: No notable events documented.

## 2022-09-27 NOTE — Anesthesia Procedure Notes (Signed)
Procedure Name: MAC Date/Time: 09/27/2022 1:19 PM  Performed by: Dorthea Cove, CRNAPre-anesthesia Checklist: Emergency Drugs available, Patient identified, Suction available, Patient being monitored and Timeout performed Patient Re-evaluated:Patient Re-evaluated prior to induction Oxygen Delivery Method: Nasal cannula Preoxygenation: Pre-oxygenation with 100% oxygen Induction Type: IV induction Placement Confirmation: positive ETCO2

## 2022-09-27 NOTE — Anesthesia Postprocedure Evaluation (Signed)
Anesthesia Post Note  Patient: [name]  Procedure(s) Performed: ESOPHAGOGASTRODUODENOSCOPY (EGD) WITH PROPOFOL BIOPSY     Patient location during evaluation: PACU Anesthesia Type: MAC Level of consciousness: awake and alert Pain management: pain level controlled Vital Signs Assessment: post-procedure vital signs reviewed and stable Respiratory status: spontaneous breathing, nonlabored ventilation and respiratory function stable Cardiovascular status: blood pressure returned to baseline and stable Postop Assessment: no apparent nausea or vomiting Anesthetic complications: no   No notable events documented.  Last Vitals:  Vitals:   09/27/22 1400 09/27/22 1410  BP: 95/67 111/73  Pulse: 65   Resp: (!) 21   Temp:    SpO2: 96%     Last Pain:  Vitals:   09/27/22 1410  TempSrc:   PainSc: 0-No pain                 Pervis Hocking

## 2022-09-27 NOTE — Interval H&P Note (Signed)
History and Physical Interval Note:  09/27/2022 12:34 PM  [name]  has presented today for surgery, with the diagnosis of melena, anemia, cirrhosis.  The various methods of treatment have been discussed with the patient and family. After consideration of risks, benefits and other options for treatment, the patient has consented to  Procedure(s): ESOPHAGOGASTRODUODENOSCOPY (EGD) WITH PROPOFOL (N/A) with possible esophageal variceal band ligation as a surgical intervention.  The patient's history has been reviewed, patient examined, no change in status, stable for surgery.  I have reviewed the patient's chart and labs.  Questions were answered to the patient's satisfaction.     Loney Laurence

## 2022-09-27 NOTE — Procedures (Signed)
PROCEDURE SUMMARY:  Successful US guided diagnostic and therapeutic paracentesis from RLQ.  Yielded 3.5 L of dark, bloody fluid.  No immediate complications.  Pt tolerated well.   Specimen was sent for labs.  EBL < 1 mL  Tyson Alias, AGNP 09/27/2022 4:29 PM

## 2022-09-28 ENCOUNTER — Encounter (HOSPITAL_COMMUNITY): Payer: Self-pay | Admitting: *Deleted

## 2022-09-28 ENCOUNTER — Encounter (HOSPITAL_COMMUNITY): Payer: Self-pay | Admitting: Internal Medicine

## 2022-09-28 ENCOUNTER — Encounter: Payer: Self-pay | Admitting: Internal Medicine

## 2022-09-28 ENCOUNTER — Other Ambulatory Visit (HOSPITAL_COMMUNITY): Payer: Self-pay

## 2022-09-28 DIAGNOSIS — K7460 Unspecified cirrhosis of liver: Secondary | ICD-10-CM | POA: Diagnosis not present

## 2022-09-28 DIAGNOSIS — K921 Melena: Secondary | ICD-10-CM | POA: Diagnosis not present

## 2022-09-28 DIAGNOSIS — K7290 Hepatic failure, unspecified without coma: Secondary | ICD-10-CM | POA: Diagnosis not present

## 2022-09-28 LAB — CBC WITH DIFFERENTIAL/PLATELET
Abs Immature Granulocytes: 0.02 10*3/uL (ref 0.00–0.07)
Basophils Absolute: 0 10*3/uL (ref 0.0–0.1)
Basophils Relative: 0 %
Eosinophils Absolute: 0.1 10*3/uL (ref 0.0–0.5)
Eosinophils Relative: 2 %
HCT: 29.4 % — ABNORMAL LOW (ref 36.0–46.0)
Hemoglobin: 10.3 g/dL — ABNORMAL LOW (ref 12.0–15.0)
Immature Granulocytes: 0 %
Lymphocytes Relative: 19 %
Lymphs Abs: 1 10*3/uL (ref 0.7–4.0)
MCH: 27.7 pg (ref 26.0–34.0)
MCHC: 35 g/dL (ref 30.0–36.0)
MCV: 79 fL — ABNORMAL LOW (ref 80.0–100.0)
Monocytes Absolute: 0.3 10*3/uL (ref 0.1–1.0)
Monocytes Relative: 6 %
Neutro Abs: 3.7 10*3/uL (ref 1.7–7.7)
Neutrophils Relative %: 73 %
Platelets: 82 10*3/uL — ABNORMAL LOW (ref 150–400)
RBC: 3.72 MIL/uL — ABNORMAL LOW (ref 3.87–5.11)
RDW: 15.8 % — ABNORMAL HIGH (ref 11.5–15.5)
WBC: 5.2 10*3/uL (ref 4.0–10.5)
nRBC: 0 % (ref 0.0–0.2)

## 2022-09-28 LAB — MAGNESIUM: Magnesium: 1.9 mg/dL (ref 1.7–2.4)

## 2022-09-28 LAB — COMPREHENSIVE METABOLIC PANEL
ALT: 10 U/L (ref 0–44)
AST: 19 U/L (ref 15–41)
Albumin: 2.5 g/dL — ABNORMAL LOW (ref 3.5–5.0)
Alkaline Phosphatase: 61 U/L (ref 38–126)
Anion gap: 7 (ref 5–15)
BUN: 20 mg/dL (ref 8–23)
CO2: 20 mmol/L — ABNORMAL LOW (ref 22–32)
Calcium: 8.1 mg/dL — ABNORMAL LOW (ref 8.9–10.3)
Chloride: 107 mmol/L (ref 98–111)
Creatinine, Ser: 0.97 mg/dL (ref 0.44–1.00)
GFR, Estimated: >60 mL/min (ref >60)
Glucose, Bld: 147 mg/dL — ABNORMAL HIGH (ref 70–99)
Potassium: 4 mmol/L (ref 3.5–5.1)
Sodium: 134 mmol/L — ABNORMAL LOW (ref 135–145)
Total Bilirubin: 1.1 mg/dL (ref 0.3–1.2)
Total Protein: 5.4 g/dL — ABNORMAL LOW (ref 6.5–8.1)

## 2022-09-28 LAB — TRIGLYCERIDES, BODY FLUIDS: Triglycerides, Fluid: 39 mg/dL

## 2022-09-28 LAB — PHOSPHORUS: Phosphorus: 3.4 mg/dL (ref 2.5–4.6)

## 2022-09-28 MED ORDER — FUROSEMIDE 40 MG PO TABS
40.0000 mg | ORAL_TABLET | Freq: Every day | ORAL | 0 refills | Status: AC
  Filled 2022-09-28: qty 30, 30d supply, fill #0

## 2022-09-28 MED ORDER — CEFDINIR 300 MG PO CAPS
300.0000 mg | ORAL_CAPSULE | Freq: Two times a day (BID) | ORAL | 0 refills | Status: AC
  Filled 2022-09-28: qty 4, 2d supply, fill #0

## 2022-09-28 MED ORDER — FUROSEMIDE 40 MG PO TABS
40.0000 mg | ORAL_TABLET | Freq: Every day | ORAL | Status: DC
  Administered 2022-09-28: 40 mg via ORAL
  Filled 2022-09-28: qty 1

## 2022-09-28 MED ORDER — SPIRONOLACTONE 25 MG PO TABS
50.0000 mg | ORAL_TABLET | Freq: Every day | ORAL | Status: DC
  Administered 2022-09-28: 50 mg via ORAL
  Filled 2022-09-28: qty 2

## 2022-09-28 MED ORDER — TRAMADOL HCL 50 MG PO TABS
50.0000 mg | ORAL_TABLET | Freq: Two times a day (BID) | ORAL | 0 refills | Status: AC | PRN
  Filled 2022-09-28: qty 10, 5d supply, fill #0

## 2022-09-28 MED ORDER — OXYCODONE HCL 5 MG PO TABS
5.0000 mg | ORAL_TABLET | Freq: Four times a day (QID) | ORAL | 0 refills | Status: DC | PRN
  Filled 2022-09-28: qty 12, 3d supply, fill #0

## 2022-09-28 MED ORDER — MEDIHONEY WOUND/BURN DRESSING EX PSTE
1.0000 | PASTE | Freq: Every day | CUTANEOUS | 0 refills | Status: AC
  Filled 2022-09-28: qty 15, 15d supply, fill #0

## 2022-09-28 MED ORDER — PANTOPRAZOLE SODIUM 40 MG PO TBEC: 40.0000 mg | DELAYED_RELEASE_TABLET | Freq: Two times a day (BID) | ORAL | Status: DC

## 2022-09-28 MED ORDER — POLYETHYLENE GLYCOL 3350 17 GM/SCOOP PO POWD
17.0000 g | Freq: Every day | ORAL | 0 refills | Status: AC | PRN
  Filled 2022-09-28: qty 238, 14d supply, fill #0

## 2022-09-28 MED ORDER — PANTOPRAZOLE SODIUM 40 MG PO TBEC
40.0000 mg | DELAYED_RELEASE_TABLET | Freq: Every day | ORAL | 1 refills | Status: AC
  Filled 2022-09-28: qty 30, 30d supply, fill #0

## 2022-09-28 MED ORDER — SPIRONOLACTONE 50 MG PO TABS
50.0000 mg | ORAL_TABLET | Freq: Every day | ORAL | 0 refills | Status: AC
  Filled 2022-09-28: qty 30, 30d supply, fill #0

## 2022-09-28 NOTE — Discharge Summary (Signed)
Physician Discharge Summary   Patient: [name] MRN: [id number] DOB: 1956-01-31  Admit date:     09/23/2022  Discharge date: 09/28/2022  Discharge Physician: Raiford Noble, DO   PCP: [facility], [facility]   Recommendations at discharge:   With PCP within 1 to 2 weeks and repeat CBC, CMP, mag, Phos within 1 week Follow-up with gastroenterology in outpatient setting with Dr. Randel Pigg in 1 to 2 weeks  Follow-up with interventional radiology for recurrent paracentesis Follow-up with general surgeon outpatient setting  Discharge Diagnoses: Principal Problem:   Decompensated hepatic cirrhosis ([place])  Resolved Problems:   * No resolved hospital problems. [facility] Course: The patient is a 67 year old female with a past medical history significant for #2 NASH cirrhosis with large ascites, iron deficiency anemia, ventral hernia as well as other comorbidities including irritable bowel syndrome who presented to the [facility] ED with chief complaints of abdominal distention and discomfort. She had her first paracentesis on August 30, 2022 with nearly 6 L of abdominal fluid removed. Return to the ED 12 days later and then on 09/13/2022 had recurrent large ascites. She was referred to GI and had another paracentesis by IR 6.9 L of ascitic fluid and discharged home. 11 days later she returned again for the same plane and endorses abdominal distention as well as some abdominal discomfort with diffuse abdominal tenderness with palpation. She is worked up and found to have decompensated liver cirrhosis with large ascites and a CT scan revealed changes cirrhosis associated splenomegaly and large volume ascites. [facility] was asked to admit this patient for further evaluation and IR was consulted for another large-volume paracentesis. GI was consulted for further evaluation and recommending continuing Furosemide 40 mg p.o. daily as well Spironolactone 50 mg p.o. daily and pursuing endoscopy tomorrow given her  dark stools.   Endoscopy done and showed "Grade I esophageal varices. Z-line regular, 35 cm from the incisors. Non-bleeding gastric ulcers with a clean ulcer base (Forrest Class III). Biopsied. Gastritis. Portal hypertensive gastropathy. Normal examined duodenum."   Gastroenterology is recommending p.o. Protonix for 2 months and continuing diuretics and resuming nadolol.  Will pursuing repeat paracentesis given her worsening abdominal distention.   Repeat paracentesis was done and she had 3-1/2 L drained however this time is a little bit bloody.  A CT scan of the abdomen pelvis was obtained and showed "Stranding surrounding the pancreatic head worrisome for acute pancreatitis. Small volume ascites, decreased from prior. Stable findings of cirrhosis and portal hypertension. Cholelithiasis. Umbilical hernia containing ascites. There is now a hyperdense fluid fluid level within the hernia which may represent hemorrhage. Dilated appendix measuring 1 cm without surrounding inflammation. Correlate clinically for acute appendicitis. 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  They do not clinically suspect that she has appendicitis as she has no abdominal pain there and even though that she had stranding surrounding pancreatic head she did not likely have pancreatitis.  She tolerated her meals well and abdominal pain improved.  She was ambulatory and felt better and she is stable for discharge at this time on a low-sodium diet and will continue her diuretics and follow-up with PCP, interventional radiology for recurrent paracentesis as well as gastroenterology in outpatient setting.  Assessment and Plan:  Decompensated NASH/Hepatic Cirrhosis with large Ascites Hyperbilirubinemia -Hx of prior 2 large volume paracentesis -CT scan of the abdomen pelvis  done and showed "Changes of cirrhosis  with associated splenomegaly and large volume ascites. Sigmoid diverticulosis. No acute findings." -IR consulted for a 3rd paracentesis and removed 7 L and will need further analysis of the fluid -Paracentesis therapeutic and diagnostic. -No reported nausea or vomiting. -IV albumin 25g q6h x 2 doses -C/w Low-sodium diet.  Dietitian consult for patient's education and given the "Low Sodium Nutrition Therapy" handout -Po lasix and po spironolactone initiated and now GI is increasing her diuretics and increased the furosemide to 40 g p.o. daily and increase to spironolactone 50 mg p.o. daily if she tolerates from a renal perspective though will need to titrate to at least 60 mg p.o. daily furosemide and 100 mg p.o. daily spironolactone eventually  -GI does not feel she needs a TIPS at this point and consideration of TIPS at this point is premature given that they want to try and maximize her attempts at medical therapy first -INR 1.2 Recent Labs  Lab 09/11/22 1345 09/23/22 1550 09/24/22 1203 09/25/22 0156 09/26/22 0153 09/27/22 0259 09/28/22 0127  BILITOT 2.1* 1.1 0.8 0.9 1.3* 1.0 1.1  -Meld score 12, estimated less than 2% 90-day mortality. -GI consulted to assist with the management and appreciate their recommendations -See below -Continue to be volume overloaded so we will reevaluate a ultrasound for another repeat paracentesis prior to discharge but in the interim we will continue diuretics as above -Repeat centesis was done and yielded 3.5 L however was dark bloody -CT scan of the abdomen pelvis was obtained because of this and showed "Stranding surrounding the pancreatic head worrisome for acute pancreatitis. Small volume ascites, decreased from prior. Stable findings of cirrhosis and portal hypertension. Cholelithiasis. Umbilical hernia containing ascites. There is now a hyperdense fluid fluid level within the hernia which may represent hemorrhage. Dilated appendix measuring 1 cm  without surrounding inflammation. Correlate clinically for acute appendicitis. 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  They do not clinically suspect that she has appendicitis as she has no abdominal pain there and even though that she had stranding surrounding pancreatic head she did not likely have pancreatitis given lack of symptoms of nausea, vomiting and mid abdominal discomfort.  Most of her abdominal discomfort resolved left lower quadrant..  She tolerated her meals well and abdominal pain improved and was discharged home and she will follow-up with PCP as well as GI in outpatient setting   Dark Black Stools in the setting of gastric ulcers and gastritis -Concern for GI bleed given her history of esophageal varices -Check FOBT and pending -Monitor hemoglobin/hematocrit every 6h: See trend as below -Continue IV PPI as below -GI notified and they are not convinced that this represents GI bleeding but they are going to pursue an endoscopy for her evaluation of anemia and dark stools given that she has a prior history of esophageal varices and banding; -EGD done and showed "    - Grade I esophageal varices.                           - Z-line regular, 35 cm from the incisors.                           - Non-bleeding gastric ulcers with a  clean ulcer                            base (Forrest Class III). Biopsied.                           - Gastritis.                           - Portal hypertensive gastropathy.                           - Normal examined duodenum." -GI is recommending p.o. Protonix 40 mg p.o. daily for 2 months and then repeating endoscopy in 1 year for surveillance   Left Knee Burn with concern for developing cellulitis -Follow MRSA screening test and peripheral blood cultures x 2 -C/w Rocephin 2 g IV empirically."   P.o. cefdinir for discharge and continue Medihoney -Analgesics as needed -Wound care Nurse Consulted and Local wound care with wound care specialist's guidance is now getting Medihoney   E. coli UTI, present on admission -UA positive for pyuria and urinalysis showed hazy appearance with amber color urine, trace leukocytes, positive nitrites, many bacteria, 6-10 RBCs per high-power field, 6-10 squames bacterial cells, 1-20 WBCs and greater than 100,000 colony-forming units of E. coli with sensitivities showing resistance to ampicillin and Unasyn -Initiated Rocephin 2 g daily -Follow urine culture for ID and sensitivities and continue current treatment and changed to p.o. cefdinir for discharge -Continue to Monitor fever curve and WBC and transition to po closer to D/C    Metabolic Acidosis -Patient has a CO2 of 20, AG of 8, and Chloride Level of 109 on admission and repeat today showed a CO2 of 20, anion gap of 7, chloride level of 107 -Continue to Monitor and Trend and repeat CMP in a.m.   Renal Insufficiency -In the setting of diuresis -BUN/Cr Trend: Recent Labs  Lab 09/11/22 1345 09/23/22 1550 09/24/22 1203 09/25/22 0156 09/26/22 0153 09/27/22 0259 09/28/22 0127  BUN 21 16 15 15 14 17 20   CREATININE 1.02* 1.02* 0.98 1.05* 0.99 1.00 0.97   -Avoid Nephrotoxic Medications if possible, Contrast Dyes, Hypotension and Dehydration to Ensure Adequate Renal Perfusion and will need to Renally Adjust Meds but she remains on 40 mg of furosemide and 50 mg of spironolactone -Continue to Monitor and Trend Renal Function carefully and repeat CMP in the AM    Hypokalemia -Patient's K+ Level Trend: Recent Labs  Lab 09/11/22 1345 09/23/22 1550 09/24/22 1203 09/25/22 0156 09/26/22 0153 09/27/22 0259 09/28/22 0127  K 4.0 4.0 3.6 3.6 3.3* 4.0 4.0  -Continue to Monitor and Replete as Necessary -Repeat CMP within 1 week   GERD -Resumed home PPI Substitution with Pantoprazole 40 mg Daily  however given her black stools now we will stop p.o. and start IV PPI with pantoprazole every 12 monitor for signs and symptoms of further bleeding -After EGD now GI recommending po Pantoprazole 40 mg Daily x2 months   Microcytic Anemia -Hgb/Hct Trend: Recent Labs  Lab 09/25/22 2014 09/26/22 0153 09/26/22 0730 09/26/22 1428 09/26/22 2007 09/27/22 0259 09/28/22 0127  HGB 10.9* 10.3* 10.3* 11.4* 10.9* 10.6* 10.3*  HCT 32.3* 30.5* 30.6* 33.9* 31.3* 29.8* 29.4*  MCV  --  80.1  --   --   --  78.6* 79.0*  -Checked Anemia Panel iron level of  35, UIBC 115, TIBC 150, saturation ratios of 20%, ferritin level 440, folate level 7.8, vitamin B12 554 -Continue to Monitor for S/Sx of Bleeding; No overt bleeding noted -Repeat CBC within 1 week   Thrombocytopenia -Platelet Count Trend: Recent Labs  Lab 09/11/22 1345 09/23/22 1550 09/24/22 1203 09/25/22 0156 09/26/22 0153 09/27/22 0259 09/28/22 0127  PLT 59* 73* 73* 77* 74* 73* 82*  -Continue to monitor for signs and symptoms bleeding; no overt bleeding noted -Repeat CBC within 1 week   Hypoalbuminemia -Patient's Albumin Trend: Recent Labs  Lab 09/11/22 1345 09/23/22 1550 09/24/22 1203 09/25/22 0156 09/26/22 0153 09/27/22 0259 09/28/22 0127  ALBUMIN 3.7 3.0* 3.0* 2.7* 2.7* 2.6* 2.5*  -Continue to Monitor and Trend and repeat CMP within 1 week   Obesity -Complicates overall prognosis and care -Estimated body mass index is 30.54 kg/m as calculated from the following:   Height as of this encounter: 5\' 2"  (1.575 m).   Weight as of this encounter: 75.8 kg.  -Weight Loss and Dietary Counseling given   Nutrition Documentation    [place] ED to Hosp-Admission (Discharged) from 09/23/2022 in [place]  SURGICAL  Nutrition Problem Increased nutrient needs  Etiology acute illness  Nutrition Goal Patient will meet greater than or equal to 90% of their needs  Interventions Education, MVI      Pain  control - [place] Controlled Substance Reporting System database was reviewed. and patient was instructed, not to drive, operate heavy machinery, perform activities at heights, swimming or participation in water activities or provide baby-sitting services while on Pain, Sleep and Anxiety Medications; until their outpatient Physician has advised to do so again. Also recommended to not to take more than prescribed Pain, Sleep and Anxiety Medications.   Consultants: Gastroenterology, interventional radiology, discussed the case with general surgery Procedures performed: As delineated as above Disposition: Home Diet recommendation:  Discharge Diet Orders (From admission, onward)     Start     Ordered   09/28/22 0000  Diet - low sodium heart healthy       Comments: 2 Gram Sodium Diet   09/28/22 1406           Cardiac diet -2 g sodium diet DISCHARGE MEDICATION: Allergies as of 09/28/2022       Reactions   Hydrocodone-acetaminophen Nausea And Vomiting   Codeine Nausea And Vomiting   Lactose Intolerance (gi)    unknown        Medication List     STOP taking these medications    esomeprazole 40 MG capsule Commonly known as: NEXIUM       TAKE these medications    acetaminophen 500 MG tablet Commonly known as: TYLENOL Take 500 mg by mouth every 6 (six) hours as needed for moderate pain.   cefdinir 300 MG capsule Commonly known as: OMNICEF Take 1 capsule (300 mg total) by mouth 2 (two) times daily for 2 days.   furosemide 40 MG tablet Commonly known as: LASIX Take 1 tablet (40 mg total) by mouth daily. Start taking on: September 29, 2022   leptospermum manuka honey Pste paste Apply 1 Application topically daily. Start taking on: September 29, 2022   nadolol 20 MG tablet Commonly known as: Corgard Take 1 tablet (20 mg total) by mouth daily.   ondansetron 4 MG disintegrating tablet Commonly known as: Zofran ODT Take 1 tablet (4 mg total) by mouth every 8 (eight)  hours as needed for nausea or vomiting.  pantoprazole 40 MG tablet Commonly known as: PROTONIX Take 1 tablet (40 mg total) by mouth daily.   polyethylene glycol powder 17 GM/SCOOP powder Commonly known as: GLYCOLAX/MIRALAX Take 17 g by mouth daily as needed for mild constipation.   spironolactone 50 MG tablet Commonly known as: ALDACTONE Take 1 tablet (50 mg total) by mouth daily. Start taking on: September 29, 2022   traMADol 50 MG tablet Commonly known as: ULTRAM Take 1 tablet (50 mg total) by mouth every 12 (twelve) hours as needed for moderate pain or severe pain. What changed: when to take this               Discharge Care Instructions  (From admission, onward)           Start     Ordered   09/28/22 0000  Discharge wound care:       Comments: Clean R knee wound with NS, apply Medihoney to wound bed daily, cover with foam dressing.  May lift foam to replace Medihoney daily, change foam dressing q3 days and prn soiling.   09/28/22 1406            Discharge Exam: Filed Weights   09/23/22 1542  Weight: 75.8 kg   Vitals:   09/28/22 0526 09/28/22 0830  BP: 96/62 94/68  Pulse: (!) 59 (!) 57  Resp: 18 17  Temp: 98.4 F (36.9 C) 98.1 F (36.7 C)  SpO2: 98% 96%   Examination: Physical Exam:  Constitutional: WN/WD open white female chronically ill-appearing in no acute distress Respiratory: Diminished to auscultation bilaterally, no wheezing, rales, rhonchi or crackles. Normal respiratory effort and patient is not tachypenic. No accessory muscle use.  Unlabored breathing Cardiovascular: RRR, no murmurs / rubs / gallops. S1 and S2 auscultated. No extremity edema.  Abdomen: Soft, very mildly tender in the lower left quadrant-.  Distended secondary to body habitus and ascites. No masses palpated. No appreciable hepatosplenomegaly. Bowel sounds positive.  GU: Deferred. Musculoskeletal: No clubbing / cyanosis of digits/nails. No joint deformity upper and lower  extremities. Skin: No rashes, lesions, ulcers but she has multiple tattoos noted throughout her body. No induration; Warm and dry.  Neurologic: CN 2-12 grossly intact with no focal deficits. Romberg sign and cerebellar reflexes not assessed.  Psychiatric: Normal judgment and insight. Alert and oriented x 3. Normal mood and appropriate affect.   Condition at discharge: stable  The results of significant diagnostics from this hospitalization (including imaging, microbiology, ancillary and laboratory) are listed below for reference.   Imaging Studies: CT ABDOMEN PELVIS WO CONTRAST  Result Date: 09/27/2022 CLINICAL DATA:  Acute abdominal pain EXAM: CT ABDOMEN AND PELVIS WITHOUT CONTRAST TECHNIQUE: Multidetector CT imaging of the abdomen and pelvis was performed following the standard protocol without IV contrast. RADIATION DOSE REDUCTION: This exam was performed according to the departmental dose-optimization program which includes automated exposure control, adjustment of the mA and/or kV according to patient size and/or use of iterative reconstruction technique. COMPARISON:  CT abdomen and pelvis 09/24/2022 FINDINGS: Lower chest: No acute abnormality. Hepatobiliary: Nodular liver contour is again noted compatible with cirrhosis. Multiple gallstones are present. There is no biliary ductal dilatation. Pancreas: There is mild stranding surrounding the pancreatic head. There is no fluid collection or ductal dilatation. Spleen: The spleen is moderately enlarged, unchanged. Adrenals/Urinary Tract: Adrenal glands are unremarkable. Kidneys are normal, without renal calculi, focal lesion, or hydronephrosis. Bladder is unremarkable. Stomach/Bowel: Appendix is dilated measuring 1 cm without surrounding inflammation. No bowel obstruction, pneumatosis or  free air. Stomach and small bowel are within normal limits. Colonic diverticula are present. Vascular/Lymphatic: Aorta and IVC are normal in size. There are  nonenlarged upper abdominal, mesenteric and retroperitoneal lymph nodes. There is an aneurysm in the region of the distal celiac artery measuring 2.2 cm, unchanged. This is best seen on image 3/27. Splenic varices are present. Reproductive: Status post hysterectomy. No adnexal masses. Other: Small volume ascites is present which has decreased from prior. There is central mesenteric haziness favored as residual edema. Umbilical hernia is again noted containing ascites. There is now a hyperdense fluid fluid level within the umbilical hernia. Musculoskeletal: No acute or significant osseous findings. Sacral stimulator device again noted. IMPRESSION: 1. Stranding surrounding the pancreatic head worrisome for acute pancreatitis. 2. Small volume ascites, decreased from prior. 3. Stable findings of cirrhosis and portal hypertension. 4. Cholelithiasis. 5. Umbilical hernia containing ascites. There is now a hyperdense fluid fluid level within the hernia which may represent hemorrhage. 6. Dilated appendix measuring 1 cm without surrounding inflammation. Correlate clinically for acute appendicitis. 7. 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 Electronically Signed   By: Ronney Asters M.D.   On: 09/27/2022 19:40   IR Paracentesis  Result Date: 09/27/2022 INDICATION: History of NASH cirrhosis with recurrent large volume ascites. Request for therapeutic and diagnostic paracentesis with no max. EXAM: ULTRASOUND GUIDED DIAGNOSTIC AND THERAPEUTIC RIGHT LOWER QUADRANT PARACENTESIS MEDICATIONS: 10 mL 1 % lidocaine COMPLICATIONS: None immediate. PROCEDURE: Informed written consent was obtained from the patient after a discussion of the risks, benefits and alternatives to treatment. A timeout was performed prior to the initiation of the procedure. Initial ultrasound scanning demonstrates a large amount of ascites within the right lower abdominal quadrant. The right lower abdomen was prepped and draped in the usual sterile  fashion. 1% lidocaine was used for local anesthesia. Following this, a 19 gauge, 7-cm, Yueh catheter was introduced. An ultrasound image was saved for documentation purposes. The paracentesis was performed. The catheter was removed and a dressing was applied. The patient tolerated the procedure well without immediate post procedural complication. Patient received post-procedure intravenous albumin; see nursing notes for details. FINDINGS: A total of approximately 3.5 L of dark, bloody fluid was removed. Samples were sent to the laboratory as requested by the clinical team. IMPRESSION: Successful ultrasound-guided paracentesis yielding 3.5 liters of peritoneal fluid. Note was made in change of nature of fluid with current paracentesis to dark, bloody fluid consistent with interval bleeding into the peritoneal cavity. PLAN: The patient has required >/=2 paracenteses in a 30 day period and a formal evaluation by the [facility] has been arranged. Read by: Narda Rutherford, AGNP-BC Electronically Signed   By: Aletta Edouard M.D.   On: 09/27/2022 16:58   IR Paracentesis  Result Date: 09/24/2022 INDICATION: 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 Patient presents for therapeutic and diagnostic paracentesis EXAM: ULTRASOUND GUIDED THERAPEUTIC AND DIAGNOSTIC PARACENTESIS MEDICATIONS: Lidocaine 1% 10 mL COMPLICATIONS: None immediate. PROCEDURE: Informed written consent was obtained from the patient after a discussion of the risks, benefits and alternatives to treatment. A timeout was performed prior to the initiation of the procedure. Initial ultrasound scanning demonstrates a large amount of ascites within the left lower abdominal quadrant. The left lower abdomen was prepped and draped in the usual sterile fashion. 1% lidocaine was used for local anesthesia. Following this, a 19 gauge, 7-cm, Yueh catheter was introduced. An ultrasound  image was saved for documentation  purposes. The paracentesis was performed. The catheter was removed and a dressing was applied. The patient tolerated the procedure well without immediate post procedural complication. Patient received post-procedure intravenous albumin; see nursing notes for details. FINDINGS: A total of approximately 7 L of straw-colored fluid was removed. Samples were sent to the laboratory as requested by the clinical team. IMPRESSION: Successful ultrasound-guided therapeutic and diagnostic paracentesis yielding 7 liters of peritoneal fluid. Read by: Rushie Nyhan, NP PLAN: The patient has required >/=2 paracenteses in a 30 day period and a formal evaluation by the [facility] has been arranged. Electronically Signed   By: Sandi Mariscal M.D.   On: 09/24/2022 12:53   CT ABDOMEN PELVIS W CONTRAST  Result Date: 09/24/2022 CLINICAL DATA:  Abdominal distension, pain EXAM: CT ABDOMEN AND PELVIS WITH CONTRAST TECHNIQUE: Multidetector CT imaging of the abdomen and pelvis was performed using the standard protocol following bolus administration of intravenous contrast. RADIATION DOSE REDUCTION: This exam was performed according to the departmental dose-optimization program which includes automated exposure control, adjustment of the mA and/or kV according to patient size and/or use of iterative reconstruction technique. CONTRAST:  12mL OMNIPAQUE IOHEXOL 350 MG/ML SOLN COMPARISON:  08/30/2022 FINDINGS: Lower chest: No acute findings Hepatobiliary: Shrunken, nodular liver compatible with cirrhosis. Layering gallstones within the gallbladder. No focal hepatic abnormality. Pancreas: No focal abnormality or ductal dilatation. Spleen: Splenomegaly with a craniocaudal length of 22 cm. Adrenals/Urinary Tract: No adrenal abnormality. No focal renal abnormality. No stones or hydronephrosis. Urinary bladder is unremarkable. Stomach/Bowel: Sigmoid  diverticulosis. No active diverticulitis. Stomach and small bowel decompressed, unremarkable. Vascular/Lymphatic: Shotty retroperitoneal lymph nodes, none pathologically enlarged. No evidence of aneurysm or adenopathy. Reproductive: Prior hysterectomy.  No adnexal masses. Other: Large volume ascites. Small umbilical hernia containing ascites. Musculoskeletal: No acute bony abnormality. IMPRESSION: Changes of cirrhosis with associated splenomegaly and large volume ascites. Sigmoid diverticulosis. No acute findings. Electronically Signed   By: Rolm Baptise M.D.   On: 09/24/2022 01:00   DG Chest 2 View  Result Date: 09/23/2022 CLINICAL DATA:  67 year old female presents for evaluation of shortness of breath. EXAM: CHEST - 2 VIEW COMPARISON:  February 16, 2019 FINDINGS: Cardiomediastinal contours and hilar structures are stable. No pneumothorax. No pleural effusion. On limited assessment no acute skeletal findings. IMPRESSION: No acute findings. Electronically Signed   By: Zetta Bills M.D.   On: 09/23/2022 16:18   US Paracentesis  Result Date: 09/13/2022 INDICATION: Ascites EXAM: ULTRASOUND GUIDED THERAPEUTIC PARACENTESIS MEDICATIONS: 8 cc 1% lidocaine COMPLICATIONS: None immediate. PROCEDURE: Informed written consent was obtained from the patient after a discussion of the risks, benefits and alternatives to treatment. A timeout was performed prior to the initiation of the procedure. Initial ultrasound scanning demonstrates a large amount of ascites within the right lower abdominal quadrant. The right lower abdomen was prepped and draped in the usual sterile fashion. 1% lidocaine was used for local anesthesia. Following this, a 19 gauge, 7-cm, Yueh catheter was introduced. An ultrasound image was saved for documentation purposes. The paracentesis was performed. The catheter was removed and a dressing was applied. The patient tolerated the procedure well without immediate post procedural complication. FINDINGS:  A total of approximately 6.9 L of straw-colored fluid was removed. Ordering provider did not request laboratory samples IMPRESSION: Successful ultrasound-guided paracentesis yielding 6.9 liters of peritoneal fluid. Read by: Reatha Armour, PA-C PLAN: The patient has required >/=2 paracenteses in a 30 day period and a formal evaluation by the [facility] Interventional Radiology Portal Hypertension  Clinic has been arranged. Michaelle Birks, MD [facility] Radiology Electronically Signed   By: Michaelle Birks M.D.   On: 09/13/2022 16:43   US Paracentesis  Result Date: 08/30/2022 INDICATION: Ascites EXAM: ULTRASOUND GUIDED right lower quadrant therapeutic PARACENTESIS MEDICATIONS: 6 cc 1% lidocaine COMPLICATIONS: None immediate. PROCEDURE: Informed written consent was obtained from the patient after a discussion of the risks, benefits and alternatives to treatment. A timeout was performed prior to the initiation of the procedure. Initial ultrasound scanning demonstrates a large amount of ascites within the right lower abdominal quadrant. The right lower abdomen was prepped and draped in the usual sterile fashion. 1% lidocaine was used for local anesthesia. Following this, a 19 gauge, 7-cm, Yueh catheter was introduced. An ultrasound image was saved for documentation purposes. The paracentesis was performed. The catheter was removed and a dressing was applied. The patient tolerated the procedure well without immediate post procedural complication. FINDINGS: A total of approximately 5.8 L of amber fluid was removed. Ordering provider did not request laboratory samples. IMPRESSION: Successful ultrasound-guided paracentesis yielding 5.8 liters of peritoneal fluid. Read by: Reatha Armour, PA-C Electronically Signed   By: Albin Felling M.D.   On: 08/30/2022 15:21   CT ABDOMEN PELVIS W CONTRAST  Result Date: 08/30/2022 CLINICAL DATA:  Pain with known hernia. EXAM: CT ABDOMEN  AND PELVIS WITH CONTRAST TECHNIQUE: Multidetector CT imaging of the abdomen and pelvis was performed using the standard protocol following bolus administration of intravenous contrast. RADIATION DOSE REDUCTION: This exam was performed according to the departmental dose-optimization program which includes automated exposure control, adjustment of the mA and/or kV according to patient size and/or use of iterative reconstruction technique. CONTRAST:  159mL OMNIPAQUE IOHEXOL 300 MG/ML  SOLN COMPARISON:  07/12/2022 FINDINGS: Lower chest: Unremarkable. Hepatobiliary: Cirrhotic morphology again noted. Tiny hypodensity in the lateral segment left liver is too small to characterize but stable in the interval and likely benign. Numerous layering tiny calcified gallstones evident. No intrahepatic or extrahepatic biliary dilation. Pancreas: No focal mass lesion. No dilatation of the main duct. No intraparenchymal cyst. No peripancreatic edema. Spleen: Spleen is enlarged, as before. Adrenals/Urinary Tract: No adrenal nodule or mass. Kidneys unremarkable. No evidence for hydroureter. The urinary bladder appears normal for the degree of distention. Stomach/Bowel: Stomach is unremarkable. No gastric wall thickening. No evidence of outlet obstruction. Duodenum is normally positioned as is the ligament of Treitz. No small bowel wall thickening. No small bowel dilatation. The terminal ileum is normal. The appendix is normal. Appendicoliths noted in the mid appendix. No gross colonic mass. No colonic wall thickening. Vascular/Lymphatic: No abdominal aortic aneurysm. 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 This is relatively stable over multiple recent studies and progressive from 1.4 x 1.5 x 1.2 cm on a study from 09/18/2018. Portal vein and splenic vein are enlarged but patent. There is no gastrohepatic or hepatoduodenal ligament  lymphadenopathy. No retroperitoneal or mesenteric lymphadenopathy. No pelvic sidewall lymphadenopathy. Reproductive: Hysterectomy.  There is no adnexal mass. Other: Large volume abdominopelvic ascites is progressive since 07/12/2022. Right paraumbilical ventral hernia has increased slightly in size in the interval, containing only fluid with hernia sac now measuring 5.7 x 4.9 x 2.4 cm. Musculoskeletal: No worrisome lytic or sclerotic osseous abnormality. IMPRESSION: 1. Large volume abdominopelvic ascites, progressive since 07/12/2022. 2. Right paraumbilical ventral hernia has increased slightly in size in the interval, containing only fluid with  hernia sac now measuring 5.7 x 4.9 x 2.4 cm. 3. 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 This is relatively stable over multiple recent studies and progressive from 1.4 x 1.5 x 1.2 cm on a study from 09/18/2018. Short-term follow-up vascular surgery consultation recommended. 4. Cirrhotic morphology with evidence of portal hypertension. 5. Cholelithiasis. Electronically Signed   By: Misty Stanley M.D.   On: 08/30/2022 11:57    Microbiology: Results for orders placed or performed during the hospital encounter of 09/23/22  Urine Culture (for pregnant, neutropenic or urologic patients or patients with an indwelling urinary catheter)     Status: Abnormal   Collection Time: 09/24/22  2:21 AM   Specimen: Urine, Clean Catch  Result Value Ref Range Status   Specimen Description URINE, CLEAN CATCH  Final   Special Requests   Final    NONE Performed at [facility], [place] [address], [place], [place] [address]    Culture >=100,000 COLONIES/mL ESCHERICHIA COLI (A)  Final   Report Status 09/26/2022 FINAL  Final   Organism ID, Bacteria ESCHERICHIA COLI (A)  Final      Susceptibility   Escherichia coli - MIC*    AMPICILLIN >=32 RESISTANT Resistant     CEFAZOLIN <=4  SENSITIVE Sensitive     CEFEPIME <=0.12 SENSITIVE Sensitive     CEFTRIAXONE <=0.25 SENSITIVE Sensitive     CIPROFLOXACIN <=0.25 SENSITIVE Sensitive     GENTAMICIN <=1 SENSITIVE Sensitive     IMIPENEM <=0.25 SENSITIVE Sensitive     NITROFURANTOIN 32 SENSITIVE Sensitive     TRIMETH/SULFA <=20 SENSITIVE Sensitive     AMPICILLIN/SULBACTAM >=32 RESISTANT Resistant     PIP/TAZO 8 SENSITIVE Sensitive     * >=100,000 COLONIES/mL ESCHERICHIA COLI  Culture, body fluid w Gram Stain-bottle     Status: None (Preliminary result)   Collection Time: 09/24/22  9:25 AM   Specimen: Peritoneal Washings  Result Value Ref Range Status   Specimen Description PERITONEAL  Final   Special Requests NONE  Final   Culture   Final    NO GROWTH 4 DAYS Performed at [facility], [address] [place], [place] [address]    Report Status PENDING  Incomplete  Gram stain     Status: None   Collection Time: 09/24/22  9:25 AM   Specimen: Peritoneal Washings  Result Value Ref Range Status   Specimen Description PERITONEAL  Final   Special Requests NONE  Final   Gram Stain   Final    NO WBC SEEN NO ORGANISMS SEEN CYTOSPIN SMEAR Performed at [facility], [address] [place], [place] [address]    Report Status 09/24/2022 FINAL  Final  Culture, blood (Routine X 2) w Reflex to ID Panel     Status: None (Preliminary result)   Collection Time: 09/24/22 11:50 AM   Specimen: BLOOD LEFT HAND  Result Value Ref Range Status   Specimen Description BLOOD LEFT HAND  Final   Special Requests   Final    Immunocompromised BOTTLES DRAWN AEROBIC AND ANAEROBIC Blood Culture adequate volume   Culture   Final    NO GROWTH 4 DAYS Performed at [facility], [place] [address] [place], [place] [address]    Report Status PENDING  Incomplete  Culture, blood (Routine X 2) w Reflex to ID Panel     Status: None (Preliminary result)  Collection Time: 09/24/22 11:50 AM   Specimen: BLOOD LEFT WRIST  Result Value Ref  Range Status   Specimen Description BLOOD LEFT WRIST  Final   Special Requests   Final    Immunocompromised BOTTLES DRAWN AEROBIC AND ANAEROBIC Blood Culture adequate volume   Culture   Final    NO GROWTH 4 DAYS Performed at [facility], [address] [place], [place] [address]    Report Status PENDING  Incomplete  MRSA Next Gen by PCR, Nasal     Status: None   Collection Time: 09/24/22  1:10 PM   Specimen: Nasal Mucosa; Nasal Swab  Result Value Ref Range Status   MRSA by PCR Next Gen NOT DETECTED NOT DETECTED Final    Comment: (NOTE) The GeneXpert MRSA Assay (FDA approved for NASAL specimens only), is one component of a comprehensive MRSA colonization surveillance program. It is not intended to diagnose MRSA infection nor to guide or monitor treatment for MRSA infections. Test performance is not FDA approved in patients less than 43 years old. Performed at [facility], [place] [address], [place], [place] [address]   Body fluid culture w Gram Stain     Status: None (Preliminary result)   Collection Time: 09/27/22  4:42 PM   Specimen: Abdomen; Peritoneal Fluid  Result Value Ref Range Status   Specimen Description FLUID PERITONEAL ABDOMEN  Final   Special Requests NONE  Final   Gram Stain   Final    FEW WBC PRESENT,BOTH PMN AND MONONUCLEAR NO ORGANISMS SEEN    Culture   Final    NO GROWTH < 24 HOURS Performed at [facility], [place] [address], [place], [place] [address]    Report Status PENDING  Incomplete  Anaerobic culture w Gram Stain     Status: None (Preliminary result)   Collection Time: 09/27/22  4:42 PM   Specimen: Fluid  Result Value Ref Range Status   Specimen Description FLUID PERITONEAL ABDOMEN  Final   Special Requests NONE  Final   Gram Stain   Final    FEW WBC PRESENT,BOTH PMN AND MONONUCLEAR NO ORGANISMS SEEN Performed at [facility], [address] [place], [place] [address]    Culture PENDING  Incomplete   Report Status  PENDING  Incomplete    Labs: CBC: Recent Labs  Lab 09/24/22 1203 09/25/22 0156 09/25/22 1519 09/26/22 0153 09/26/22 0730 09/26/22 1428 09/26/22 2007 09/27/22 0259 09/28/22 0127  WBC 4.1 4.4  --  4.6  --   --   --  4.2 5.2  NEUTROABS 2.9 3.1  --  3.2  --   --   --  2.9 3.7  HGB 11.2* 10.5*   < > 10.3* 10.3* 11.4* 10.9* 10.6* 10.3*  HCT 31.5* 30.9*   < > 30.5* 30.6* 33.9* 31.3* 29.8* 29.4*  MCV 78.6* 80.3  --  80.1  --   --   --  78.6* 79.0*  PLT 73* 77*  --  74*  --   --   --  73* 82*   < > = values in this interval not displayed.   Basic Metabolic Panel: Recent Labs  Lab 09/24/22 1203 09/25/22 0156 09/26/22 0153 09/27/22 0259 09/28/22 0127  NA 137 135 136 135 134*  K 3.6 3.6 3.3* 4.0 4.0  CL 109 109 109 105 107  CO2 20* 21* 21* 20* 20*  GLUCOSE 134* 151* 129* 119* 147*  BUN 15 15 14 17  20  CREATININE 0.98 1.05* 0.99 1.00 0.97  CALCIUM 8.2* 8.0* 8.0* 8.2* 8.1*  MG 1.8 1.9 1.9 1.9 1.9  PHOS 2.9 3.5 3.3 3.3 3.4   Liver Function Tests: Recent Labs  Lab 09/24/22 1203 09/25/22 0156 09/26/22 0153 09/27/22 0259 09/28/22 0127  AST 25 27 23 21 19   ALT 11 13 12 12 10   ALKPHOS 70 73 66 66 61  BILITOT 0.8 0.9 1.3* 1.0 1.1  PROT 6.0* 5.6* 5.8* 5.8* 5.4*  ALBUMIN 3.0* 2.7* 2.7* 2.6* 2.5*   CBG: No results for input(s): "GLUCAP" in the last 168 hours.  Discharge time spent: greater than 30 minutes.  Signed: Raiford Noble, DO [facility] 09/28/2022

## 2022-09-29 ENCOUNTER — Other Ambulatory Visit (HOSPITAL_COMMUNITY): Payer: Self-pay

## 2022-09-29 LAB — CYTOLOGY - NON PAP

## 2022-09-29 LAB — CULTURE, BODY FLUID W GRAM STAIN -BOTTLE: Culture: NO GROWTH

## 2022-09-29 LAB — CULTURE, BLOOD (ROUTINE X 2)
Culture: NO GROWTH
Culture: NO GROWTH

## 2022-09-30 LAB — TOTAL BILIRUBIN, BODY FLUID: Total bilirubin, fluid: 0.9 mg/dL

## 2022-10-01 LAB — ANAEROBIC CULTURE W GRAM STAIN

## 2022-10-01 LAB — BODY FLUID CULTURE W GRAM STAIN: Culture: NO GROWTH

## 2022-10-01 LAB — SURGICAL PATHOLOGY

## 2023-01-11 ENCOUNTER — Other Ambulatory Visit (HOSPITAL_COMMUNITY): Payer: Self-pay | Admitting: Internal Medicine

## 2023-01-11 DIAGNOSIS — K7460 Unspecified cirrhosis of liver: Secondary | ICD-10-CM

## 2023-01-11 DIAGNOSIS — R188 Other ascites: Secondary | ICD-10-CM

## 2023-01-18 ENCOUNTER — Ambulatory Visit (HOSPITAL_COMMUNITY)
Admission: RE | Admit: 2023-01-18 | Discharge: 2023-01-18 | Disposition: A | Discharge status: Home / Self Care | Payer: Medicare PPO | Source: Ambulatory Visit | Attending: Internal Medicine | Admitting: Internal Medicine

## 2023-01-18 DIAGNOSIS — R188 Other ascites: Secondary | ICD-10-CM | POA: Insufficient documentation

## 2023-01-18 DIAGNOSIS — K7460 Unspecified cirrhosis of liver: Secondary | ICD-10-CM | POA: Diagnosis present

## 2023-01-18 HISTORY — PX: IR PARACENTESIS: IMG2679

## 2023-01-18 MED ORDER — LIDOCAINE HCL 1 % IJ SOLN
20.0000 mL | Freq: Once | INTRAMUSCULAR | Status: AC
  Administered 2023-01-18: 10 mL via INTRADERMAL

## 2023-01-18 MED ORDER — LIDOCAINE HCL 1 % IJ SOLN
INTRAMUSCULAR | Status: AC
  Filled 2023-01-18: qty 20

## 2023-01-18 NOTE — Procedures (Signed)
PROCEDURE SUMMARY:  Successful ultrasound guided paracentesis from the right lower  quadrant.  Yielded 1959 of straw colored fluid.  No immediate complications.  The patient tolerated the procedure well.    EBL < 2mL  The patient has previously been formally evaluated by the [facility] Interventional Radiology Portal Hypertension Clinic and is being actively followed for potential future intervention.

## 2023-03-22 ENCOUNTER — Other Ambulatory Visit (HOSPITAL_COMMUNITY): Payer: Self-pay | Admitting: Internal Medicine

## 2023-03-22 ENCOUNTER — Encounter (HOSPITAL_COMMUNITY): Payer: Self-pay | Admitting: Internal Medicine

## 2023-03-22 DIAGNOSIS — K7460 Unspecified cirrhosis of liver: Secondary | ICD-10-CM

## 2023-04-07 ENCOUNTER — Ambulatory Visit (HOSPITAL_COMMUNITY)
Admission: RE | Admit: 2023-04-07 | Discharge: 2023-04-07 | Disposition: A | Discharge status: Home / Self Care | Payer: Medicare PPO | Source: Ambulatory Visit | Attending: Internal Medicine | Admitting: Internal Medicine

## 2023-04-07 DIAGNOSIS — K8020 Calculus of gallbladder without cholecystitis without obstruction: Secondary | ICD-10-CM | POA: Insufficient documentation

## 2023-04-07 DIAGNOSIS — R188 Other ascites: Secondary | ICD-10-CM | POA: Insufficient documentation

## 2023-04-07 DIAGNOSIS — K7460 Unspecified cirrhosis of liver: Secondary | ICD-10-CM | POA: Diagnosis present

## 2023-04-07 DIAGNOSIS — R161 Splenomegaly, not elsewhere classified: Secondary | ICD-10-CM | POA: Diagnosis not present

## 2023-04-07 HISTORY — PX: IR PARACENTESIS: IMG2679

## 2023-04-07 MED ORDER — LIDOCAINE HCL 1 % IJ SOLN
INTRAMUSCULAR | Status: AC
  Filled 2023-04-07: qty 20

## 2023-04-07 NOTE — Procedures (Signed)
PROCEDURE SUMMARY:  Successful US guided paracentesis from right lateral abdomen.  Yielded 4.8 liters of clear yellow fluid.  No immediate complications.  Patient tolerated well.  EBL = trace  Dhanush Jokerst S Velera [place] PA-C 04/07/2023 1:00 PM

## 2023-05-03 ENCOUNTER — Other Ambulatory Visit (HOSPITAL_COMMUNITY): Payer: Self-pay | Admitting: Internal Medicine

## 2023-05-03 DIAGNOSIS — R188 Other ascites: Secondary | ICD-10-CM

## 2023-05-06 ENCOUNTER — Ambulatory Visit (HOSPITAL_COMMUNITY)
Admission: RE | Admit: 2023-05-06 | Discharge: 2023-05-06 | Disposition: A | Discharge status: Home / Self Care | Payer: Medicare PPO | Source: Ambulatory Visit | Attending: Internal Medicine | Admitting: Internal Medicine

## 2023-05-06 DIAGNOSIS — R188 Other ascites: Secondary | ICD-10-CM | POA: Diagnosis not present

## 2023-05-06 DIAGNOSIS — K7460 Unspecified cirrhosis of liver: Secondary | ICD-10-CM | POA: Diagnosis present

## 2023-05-06 HISTORY — PX: IR PARACENTESIS: IMG2679

## 2023-05-06 MED ORDER — LIDOCAINE HCL 1 % IJ SOLN
INTRAMUSCULAR | Status: AC
  Filled 2023-05-06: qty 20

## 2023-05-06 NOTE — Procedures (Signed)
PROCEDURE SUMMARY:  Successful ultrasound guided paracentesis from the left lower quadrant.  Yielded 6 L of clear yellow fluid.  No immediate complications.  The patient tolerated the procedure well.   Specimen not sent for labs.  EBL < 2 mL  The patient has required >/=2 paracenteses in a 30 day period and a screening evaluation by the [facility] Interventional Radiology Portal Hypertension Clinic has been arranged.  Alwyn Ren, [place] [phone] 05/06/2023, 3:44 PM

## 2023-05-10 ENCOUNTER — Other Ambulatory Visit (HOSPITAL_COMMUNITY): Payer: Self-pay | Admitting: Internal Medicine

## 2023-05-10 DIAGNOSIS — K7460 Unspecified cirrhosis of liver: Secondary | ICD-10-CM

## 2023-05-20 ENCOUNTER — Ambulatory Visit (HOSPITAL_COMMUNITY)
Admission: RE | Admit: 2023-05-20 | Discharge: 2023-05-20 | Disposition: A | Discharge status: Home / Self Care | Payer: Medicare PPO | Source: Ambulatory Visit | Attending: Internal Medicine | Admitting: Internal Medicine

## 2023-05-20 DIAGNOSIS — K7460 Unspecified cirrhosis of liver: Secondary | ICD-10-CM | POA: Insufficient documentation

## 2023-05-20 DIAGNOSIS — R188 Other ascites: Secondary | ICD-10-CM | POA: Insufficient documentation

## 2023-05-20 HISTORY — PX: IR PARACENTESIS: IMG2679

## 2023-05-20 MED ORDER — LIDOCAINE HCL 1 % IJ SOLN
20.0000 mL | Freq: Once | INTRAMUSCULAR | Status: AC
  Administered 2023-05-20: 10 mL via INTRADERMAL

## 2023-05-20 MED ORDER — LIDOCAINE HCL 1 % IJ SOLN
INTRAMUSCULAR | Status: AC
  Filled 2023-05-20: qty 20

## 2023-05-20 NOTE — Procedures (Signed)
PROCEDURE SUMMARY:  Successful ultrasound guided paracentesis from the left lower quadrant.  Yielded 5.1 liters of straw colored fluid.  No immediate complications.  The patient tolerated the procedure well.    EBL < 2mL  The patient has required >/=2 paracenteses in a 30 day period and a screening evaluation by the [facility] Interventional Radiology Portal Hypertension Clinic has been arranged.

## 2023-05-29 ENCOUNTER — Other Ambulatory Visit: Payer: Self-pay

## 2023-05-29 ENCOUNTER — Emergency Department (HOSPITAL_COMMUNITY): Payer: No Typology Code available for payment source

## 2023-05-29 ENCOUNTER — Emergency Department (HOSPITAL_COMMUNITY)
Admission: EM | Admit: 2023-05-29 | Discharge: 2023-05-29 | Disposition: A | Discharge status: Home / Self Care | Payer: No Typology Code available for payment source | Attending: Emergency Medicine | Admitting: Emergency Medicine

## 2023-05-29 ENCOUNTER — Encounter (HOSPITAL_COMMUNITY): Payer: Self-pay

## 2023-05-29 DIAGNOSIS — I10 Essential (primary) hypertension: Secondary | ICD-10-CM | POA: Diagnosis not present

## 2023-05-29 DIAGNOSIS — Z79899 Other long term (current) drug therapy: Secondary | ICD-10-CM | POA: Diagnosis not present

## 2023-05-29 DIAGNOSIS — R42 Dizziness and giddiness: Secondary | ICD-10-CM | POA: Insufficient documentation

## 2023-05-29 LAB — CBC WITH DIFFERENTIAL/PLATELET
Abs Immature Granulocytes: 0.01 10*3/uL (ref 0.00–0.07)
Basophils Absolute: 0 10*3/uL (ref 0.0–0.1)
Basophils Relative: 0 %
Eosinophils Absolute: 0 10*3/uL (ref 0.0–0.5)
Eosinophils Relative: 0 %
HCT: 33.9 % — ABNORMAL LOW (ref 36.0–46.0)
Hemoglobin: 11.7 g/dL — ABNORMAL LOW (ref 12.0–15.0)
Immature Granulocytes: 0 %
Lymphocytes Relative: 11 %
Lymphs Abs: 0.4 10*3/uL — ABNORMAL LOW (ref 0.7–4.0)
MCH: 26.9 pg (ref 26.0–34.0)
MCHC: 34.5 g/dL (ref 30.0–36.0)
MCV: 77.9 fL — ABNORMAL LOW (ref 80.0–100.0)
Monocytes Absolute: 0.2 10*3/uL (ref 0.1–1.0)
Monocytes Relative: 5 %
Neutro Abs: 3 10*3/uL (ref 1.7–7.7)
Neutrophils Relative %: 84 %
Platelets: 65 10*3/uL — ABNORMAL LOW (ref 150–400)
RBC: 4.35 MIL/uL (ref 3.87–5.11)
RDW: 14.9 % (ref 11.5–15.5)
Smear Review: DECREASED
WBC: 3.6 10*3/uL — ABNORMAL LOW (ref 4.0–10.5)
nRBC: 0 % (ref 0.0–0.2)

## 2023-05-29 LAB — URINALYSIS, ROUTINE W REFLEX MICROSCOPIC
Bilirubin Urine: NEGATIVE
Glucose, UA: NEGATIVE mg/dL
Hgb urine dipstick: NEGATIVE
Ketones, ur: 5 mg/dL — AB
Nitrite: NEGATIVE
Protein, ur: 30 mg/dL — AB
Specific Gravity, Urine: 1.026 (ref 1.005–1.030)
pH: 5 (ref 5.0–8.0)

## 2023-05-29 LAB — COMPREHENSIVE METABOLIC PANEL
ALT: 16 U/L (ref 0–44)
AST: 35 U/L (ref 15–41)
Albumin: 3.2 g/dL — ABNORMAL LOW (ref 3.5–5.0)
Alkaline Phosphatase: 66 U/L (ref 38–126)
Anion gap: 10 (ref 5–15)
BUN: 20 mg/dL (ref 8–23)
CO2: 20 mmol/L — ABNORMAL LOW (ref 22–32)
Calcium: 8.6 mg/dL — ABNORMAL LOW (ref 8.9–10.3)
Chloride: 107 mmol/L (ref 98–111)
Creatinine, Ser: 1.1 mg/dL — ABNORMAL HIGH (ref 0.44–1.00)
GFR, Estimated: 54 mL/min — ABNORMAL LOW (ref >60)
Glucose, Bld: 140 mg/dL — ABNORMAL HIGH (ref 70–99)
Potassium: 3.7 mmol/L (ref 3.5–5.1)
Sodium: 137 mmol/L (ref 135–145)
Total Bilirubin: 2.3 mg/dL — ABNORMAL HIGH (ref <1.2)
Total Protein: 6.4 g/dL — ABNORMAL LOW (ref 6.5–8.1)

## 2023-05-29 LAB — TROPONIN I (HIGH SENSITIVITY)
Troponin I (High Sensitivity): 7 ng/L (ref <18)
Troponin I (High Sensitivity): 8 ng/L (ref <18)

## 2023-05-29 LAB — CBG MONITORING, ED: Glucose-Capillary: 134 mg/dL — ABNORMAL HIGH (ref 70–99)

## 2023-05-29 MED ORDER — IOHEXOL 350 MG/ML SOLN
75.0000 mL | Freq: Once | INTRAVENOUS | Status: AC | PRN
  Administered 2023-05-29: 75 mL via INTRAVENOUS

## 2023-05-29 MED ORDER — LACTATED RINGERS IV BOLUS
500.0000 mL | Freq: Once | INTRAVENOUS | Status: AC
  Administered 2023-05-29: 500 mL via INTRAVENOUS

## 2023-05-29 NOTE — Discharge Instructions (Signed)
Your CT scan and lab work were overall reassuring.  You should follow up closely with your doctor and return for new or worsening symptoms.

## 2023-05-29 NOTE — ED Provider Notes (Signed)
[place] EMERGENCY DEPARTMENT AT [facility] Provider Note   CSN: [id number] Arrival date & time: 05/29/23  1445     History {Add pertinent medical, surgical, social history, OB history to HPI:1} Chief Complaint  Patient presents with   Emesis    [name] is a 67 y.o. female.   Emesis 67 year old female history of cirrhosis, hypertension presenting for dizziness.  Patient was putting up Christmas tree like about 2 hours prior to arrival.  She had sudden onset spinning sensation.  It is very severe and worse with movement.  Lasted for short time before improving.  She does not feel the spinning sensation and more feel somewhat lightheaded.  She has not eaten today.  She has emesis associated some mild chest tightness.  No diaphoresis.  No frank chest pain or shortness of breath.  She has not had any abdominal pain.  No diarrhea.  No fevers or chills.  She has not had episode quite like this before.  She still feels somewhat lightheaded when she sits up she does not have any spinning sensation.  No vision changes or weakness or numbness.     Home Medications Prior to Admission medications   Medication Sig Start Date End Date Taking? Authorizing Provider  acetaminophen (TYLENOL) 500 MG tablet Take 500 mg by mouth every 6 (six) hours as needed for moderate pain.    [provider]  furosemide (LASIX) 40 MG tablet Take 1 tablet (40 mg total) by mouth daily. 09/29/22   Sheikh, Kateri Mc Latif, DO  leptospermum manuka honey (MEDIHONEY) PSTE paste Apply 1 Application topically daily. 09/29/22   Marguerita Merles Latif, DO  nadolol (CORGARD) 20 MG tablet Take 1 tablet (20 mg total) by mouth daily. 05/21/19 08/19/19  Toney Reil, MD  ondansetron (ZOFRAN ODT) 4 MG disintegrating tablet Take 1 tablet (4 mg total) by mouth every 8 (eight) hours as needed for nausea or vomiting. 02/21/19   Gouru, Deanna Artis, MD  pantoprazole (PROTONIX) 40 MG tablet Take 1 tablet (40 mg total) by  mouth daily. 09/28/22   Marguerita Merles Latif, DO  polyethylene glycol powder (GLYCOLAX/MIRALAX) 17 GM/SCOOP powder Take 17 g by mouth daily as needed for mild constipation. 09/28/22   Marguerita Merles Latif, DO  spironolactone (ALDACTONE) 50 MG tablet Take 1 tablet (50 mg total) by mouth daily. 09/29/22   Marguerita Merles Latif, DO  traMADol (ULTRAM) 50 MG tablet Take 1 tablet (50 mg total) by mouth every 12 (twelve) hours as needed for moderate pain or severe pain. 09/28/22   Marguerita Merles Latif, DO      Allergies    Hydrocodone-acetaminophen, Codeine, and Lactose intolerance (gi)    Review of Systems   Review of Systems  Gastrointestinal:  Positive for vomiting.  Review of systems completed and notable as per HPI.  ROS otherwise negative.   Physical Exam Updated Vital Signs BP (!) 141/85 (BP Location: Left Arm)   Pulse 65   Temp 97.7 F (36.5 C) (Oral)   Resp 16   Ht 5\' 3"  (1.6 m)   Wt 68.5 kg   SpO2 100%   BMI 26.75 kg/m  Physical Exam Vitals and nursing note reviewed.  Constitutional:      General: She is not in acute distress.    Appearance: She is well-developed.  HENT:     Head: Normocephalic and atraumatic.     Nose: Nose normal.     Mouth/Throat:     Mouth: Mucous membranes are moist.  Pharynx: Oropharynx is clear.  Eyes:     Extraocular Movements: Extraocular movements intact.     Conjunctiva/sclera: Conjunctivae normal.     Pupils: Pupils are equal, round, and reactive to light.  Cardiovascular:     Rate and Rhythm: Normal rate and regular rhythm.     Pulses: Normal pulses.     Heart sounds: Normal heart sounds. No murmur heard. Pulmonary:     Effort: Pulmonary effort is normal. No respiratory distress.     Breath sounds: Normal breath sounds.  Abdominal:     Palpations: Abdomen is soft.     Tenderness: There is no abdominal tenderness. There is no guarding or rebound.  Musculoskeletal:        General: No swelling.     Cervical back: Normal range of motion and  neck supple. No rigidity or tenderness.     Right lower leg: No edema.     Left lower leg: No edema.  Skin:    General: Skin is warm and dry.     Capillary Refill: Capillary refill takes less than 2 seconds.  Neurological:     General: No focal deficit present.     Mental Status: She is alert and oriented to person, place, and time. Mental status is at baseline.     Cranial Nerves: No cranial nerve deficit.     Sensory: No sensory deficit.     Motor: No weakness.     Comments: Patient awake and alert.  Cranial nerves are intact.  Normal speech.  No aphasia, neglect, or visual field cuts.  Normal finger-to-nose bilaterally.  Normal strength and sensation all extremities.  Psychiatric:        Mood and Affect: Mood normal.     ED Results / Procedures / Treatments   Labs (all labs ordered are listed, but only abnormal results are displayed) Labs Reviewed  CBG MONITORING, ED - Abnormal; Notable for the following components:      Result Value   Glucose-Capillary 134 (*)    All other components within normal limits    EKG None  Radiology No results found.  Procedures Procedures  {Document cardiac monitor, telemetry assessment procedure when appropriate:1}  Medications Ordered in ED Medications - No data to display  ED Course/ Medical Decision Making/ A&P   {   Click here for ABCD2, HEART and other calculatorsREFRESH Note before signing :1}                              Medical Decision Making  Medical Decision Making:   [name] is a 67 y.o. female who presented to the ED today with episode of vertigo.  Vital signs reviewed.  Exam she is well-appearing.  Vertigo has subsided though feels somewhat lightheaded which sounds like presyncope.  EKG does not show any signs of acute arrhythmia or ischemia.  She does have mild chest pressure with this, obtain troponin although seems less consistent with ACS.  I more concern for vertigo.  Vertigo has subsided, she has no LVO  symptoms without aphasia, visual field cuts, or neglect and no focal deficits I do not think she meets code stroke criteria.  Will obtain CT head, CT angio of the head and neck to evaluate for possible causes.  Suspect there could be some orthostatic component to.   {crccomplexity:27900} Reviewed and confirmed nursing documentation for past medical history, family history, social history.  Reassessment and Plan:   ***  Patient's presentation is most consistent with {EM COPA:27473}     {Document critical care time when appropriate:1} {Document review of labs and clinical decision tools ie heart score, Chads2Vasc2 etc:1}  {Document your independent review of radiology images, and any outside records:1} {Document your discussion with family members, caretakers, and with consultants:1} {Document social determinants of health affecting pt's care:1} {Document your decision making why or why not admission, treatments were needed:1} Final Clinical Impression(s) / ED Diagnoses Final diagnoses:  None    Rx / DC Orders ED Discharge Orders     None

## 2023-05-29 NOTE — ED Triage Notes (Addendum)
Pt to ED via [facility] from home. Pt has had nausea, vomiting and dizziness for a few months, worsened today. Pt denies pain. Pt states everything is spinning around her.  Pt states she was hanging Christmas lights and felt like she was going to pass out so she sat down. Pt did not fall. Pt states the dizziness comes and goes. Pt feels like she is weak all over, denies numbness or tingling.   EMS VS 164/86 HR 60 Cbg=150 100% RA  18g RAC, had zofran 4mg  IV by EMS

## 2023-05-29 NOTE — ED Notes (Signed)
Pt on bedpan.

## 2023-06-10 ENCOUNTER — Ambulatory Visit (HOSPITAL_COMMUNITY)
Admission: RE | Admit: 2023-06-10 | Discharge: 2023-06-10 | Disposition: A | Discharge status: Home / Self Care | Payer: No Typology Code available for payment source | Source: Ambulatory Visit | Attending: Internal Medicine

## 2023-06-10 DIAGNOSIS — K7460 Unspecified cirrhosis of liver: Secondary | ICD-10-CM

## 2023-06-10 HISTORY — PX: IR PARACENTESIS: IMG2679

## 2023-06-10 MED ORDER — LIDOCAINE HCL 1 % IJ SOLN
INTRAMUSCULAR | Status: AC
  Filled 2023-06-10: qty 20

## 2023-06-10 MED ORDER — LIDOCAINE HCL 1 % IJ SOLN: 20.0000 mL | Freq: Once | INTRAMUSCULAR | Status: DC

## 2023-06-10 NOTE — Procedures (Signed)
PROCEDURE SUMMARY:  Successful ultrasound guided paracentesis from the right lower quadrant.  Yielded 5.1 L of clear yellow fluid.  No immediate complications.  The patient tolerated the procedure well.   Specimen not sent for labs.  EBL < 2 mL  The patient has previously been formally evaluated by the [facility] Interventional Radiology Portal Hypertension Clinic and is being actively followed for potential future intervention.  Alwyn Ren, [place] [phone] 06/10/2023, 1:14 PM

## 2023-06-17 ENCOUNTER — Other Ambulatory Visit: Payer: Self-pay | Admitting: Internal Medicine

## 2023-06-17 DIAGNOSIS — K7460 Unspecified cirrhosis of liver: Secondary | ICD-10-CM

## 2023-06-23 NOTE — Progress Notes (Signed)
Chief Complaint: Patient was seen in consultation today for portal hypertension  Referring Physician(s): Vreeland,Claire H  History of Present Illness: [name] is a 67 y.o. female with a medical history significant for HTN, iron deficiency anemia, ventral hernia and irritable bowel syndrome. She also has a history of decompensated cryptogenic cirrhosis with portal hypertension, esophageal varices s/p banding and recurrent large volume ascites. She has been hospitalized several times this year with abdominal distention, GI bleed and renal insufficiency. An EGD 09/27/22 showed Grade I varices with portal hypertensive gastropathy. She has been seen in IR for approximately 9 paracenteses this year with an average removal of 4-5 L.   The patient has been kindly referred to Interventional Radiology by Dr. Lorenso Quarry to discuss TIPS creation for management of her portal hypertension. She is due for another paracentesis today, heading to [facility] after this visit.  She is currently taking 40 mg furosemide, 50 mg spironolactone daily, unable to escalate due to her renal function which had gradually worsened over the years.  She reports an episode of severe hematemesis in 2020 which prompted EGDs and interventions.  She has had no hematemesis since then.  She denies every having encephalopathy.  She was recently diagnosed with vertigo. Since her first vertigo episode, she has been periodically nauseated.  She is hopeful she can stop having so many paracenteses.    Past Medical History:  Diagnosis Date   Hypertension    Iron deficiency anemia due to chronic blood loss 03/02/2019   Irritable bowel    Liver cirrhosis [facility])     Past Surgical History:  Procedure Laterality Date   BIOPSY  09/27/2022   Procedure: BIOPSY;  Surgeon: Lynann Bologna, DO;  Location: [facility] ENDOSCOPY;  Service: Gastroenterology;;   ESOPHAGOGASTRODUODENOSCOPY N/A 02/17/2019   Procedure: ESOPHAGOGASTRODUODENOSCOPY (EGD);   Surgeon: Toney Reil, MD;  Location: [facility] ENDOSCOPY;  Service: Gastroenterology;  Laterality: N/A;   ESOPHAGOGASTRODUODENOSCOPY (EGD) WITH PROPOFOL N/A 05/25/2019   Procedure: ESOPHAGOGASTRODUODENOSCOPY (EGD) WITH PROPOFOL;  Surgeon: Toney Reil, MD;  [facility] ENDOSCOPY;  Service: Gastroenterology;  Laterality: N/A;   ESOPHAGOGASTRODUODENOSCOPY (EGD) WITH PROPOFOL N/A 09/27/2022   Procedure: ESOPHAGOGASTRODUODENOSCOPY (EGD) WITH PROPOFOL;  Surgeon: Lynann Bologna, DO;  Location: [facility] ENDOSCOPY;  Service: Gastroenterology;  Laterality: N/A;   IR PARACENTESIS  09/24/2022   IR PARACENTESIS  09/27/2022   IR PARACENTESIS  01/18/2023   IR PARACENTESIS  04/07/2023   IR PARACENTESIS  05/06/2023   IR PARACENTESIS  05/20/2023   IR PARACENTESIS  06/10/2023   SHOULDER ARTHROTOMY     TOTAL KNEE ARTHROPLASTY Bilateral     Allergies: Hydrocodone-acetaminophen, Codeine, and Lactose intolerance (gi)  Medications: Prior to Admission medications   Medication Sig Start Date End Date Taking? Authorizing Provider  acetaminophen (TYLENOL) 500 MG tablet Take 500 mg by mouth every 6 (six) hours as needed for moderate pain.    [provider]  furosemide (LASIX) 40 MG tablet Take 1 tablet (40 mg total) by mouth daily. 09/29/22   Sheikh, Kateri Mc Latif, DO  leptospermum manuka honey (MEDIHONEY) PSTE paste Apply 1 Application topically daily. 09/29/22   Marguerita Merles Latif, DO  nadolol (CORGARD) 20 MG tablet Take 1 tablet (20 mg total) by mouth daily. 05/21/19 08/19/19  Toney Reil, MD  ondansetron (ZOFRAN ODT) 4 MG disintegrating tablet Take 1 tablet (4 mg total) by mouth every 8 (eight) hours as needed for nausea or vomiting. 02/21/19   Gouru, Deanna Artis, MD  pantoprazole (PROTONIX) 40 MG tablet  Take 1 tablet (40 mg total) by mouth daily. 09/28/22   Marguerita Merles Latif, DO  polyethylene glycol powder (GLYCOLAX/MIRALAX) 17 GM/SCOOP powder Take 17 g by mouth daily as needed for mild  constipation. 09/28/22   Marguerita Merles Latif, DO  spironolactone (ALDACTONE) 50 MG tablet Take 1 tablet (50 mg total) by mouth daily. 09/29/22   Marguerita Merles Latif, DO  traMADol (ULTRAM) 50 MG tablet Take 1 tablet (50 mg total) by mouth every 12 (twelve) hours as needed for moderate pain or severe pain. 09/28/22   Merlene Laughter, DO     Family History  Problem Relation Age of Onset   Osteoarthritis Mother    Cancer Father        cirrhosis; alcoholic   Cirrhosis Sister        alcoholic   Cirrhosis Brother        alcoholic    Social History   Socioeconomic History   Marital status: Married    Spouse name: Not on file   Number of children: Not on file   Years of education: Not on file   Highest education level: Not on file  Occupational History   Not on file  Tobacco Use   Smoking status: Never   Smokeless tobacco: Never  Vaping Use   Vaping status: Never Used  Substance and Sexual Activity   Alcohol use: No   Drug use: No   Sexual activity: Not on file  Other Topics Concern   Not on file  Social History Narrative   Lives in [place] with husband; never smoked; no current alcohol; prior ocassional alcohol. [facility] [place]   Social Drivers of Health   Financial Resource Strain: Low Risk  (04/27/2022)   [facility] prior to 09[facility]04/[facility]   Overall Financial Resource Strain (CARDIA)    Difficulty of Paying Living Expenses: Not very hard  Food Insecurity: No Food Insecurity (09/24/2022)   Hunger Vital Sign    Worried About Running Out of Food in the Last Year: Never true    Ran Out of Food in the Last Year: Never true  Transportation Needs: No Transportation Needs (09/24/2022)   PRAPARE - Administrator, Civil Service (Medical): No    Lack of Transportation (Non-Medical): No  Physical Activity: Sufficiently Active (04/27/2022)    [facility] to 09/04/[facility]22[facility] visits prior to 09/04/2022., [facility]   Exercise Vital Sign    Days of Exercise per Week: 5 days    Minutes of Exercise per Session: 60 min  Stress: Stress Concern Present (04/27/2022)   [facility] to 09/04/2022[facility] visits prior to 09/04/2022., [facility]   [employer] of Occupational Health - Occupational Stress Questionnaire    Feeling of Stress : To some extent  Social Connections: Moderately Integrated (04/27/2022)   [facility] prior to 09/04/2022[facility], [facility] visits prior to 09/04/2022., [facility]   Social Connection and Isolation Panel [NHANES]    Frequency of Communication with Friends and Family: More than three times a week    Frequency of Social Gatherings with Friends and Family: More than three times a week    Attends Religious Services: Never  Active Member of Clubs or Organizations: Yes    Attends Banker Meetings: Never    Marital Status: Married    Review of Systems: A 12 point ROS discussed and pertinent positives are indicated in the HPI above.  All other systems are negative.  Vital Signs: There were no vitals taken for this visit.  Advance Care Plan: The advanced care plan/surrogate decision maker was discussed at the time of visit and documented in the medical record.    Physical Exam Constitutional:      General: She is not in acute distress. HENT:     Mouth/Throat:     Mouth: Mucous membranes are moist.  Eyes:     General: No scleral icterus. Cardiovascular:     Rate and Rhythm: Normal rate and regular rhythm.  Pulmonary:     Effort: Pulmonary effort is normal. No respiratory distress.  Abdominal:     General: There is distension.      Tenderness: There is no abdominal tenderness.  Musculoskeletal:        General: No swelling.  Skin:    General: Skin is warm and dry.     Coloration: Skin is not jaundiced.  Neurological:     Mental Status: She is alert and oriented to person, place, and time.     Imaging: CT AP 08/30/22 08/30/22  Patent, dilated main portal and splenic veins.  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  No significant varices or portosystemic shunts.  Massive splenomegaly.  Ascites.   EGD 09/27/22 (Dr. Lorenso Quarry) - grade I varices, portal hypertensive gastropathy  EGD 05/25/23 (Dr. Allegra Lai)    EGD 02/17/19 (Dr. Allegra Lai)   Echocardiogram:  02/19/19 IMPRESSIONS   1. The left ventricle has normal systolic function, with an ejection  fraction of 55-60%. The cavity size was normal. Left ventricular diastolic  Doppler parameters are consistent with impaired relaxation.   2. The right ventricle has normal systolic function. The cavity was  normal. There is no increase in right ventricular wall thickness. Right  ventricular systolic pressure is normal with an estimated pressure of 30.2  mmHg.   3. Left atrial size was mild-moderately dilated.   4. The mitral valve is grossly normal.   5. The aortic valve is grossly normal. No stenosis of the aortic valve.   6. The aorta is normal unless otherwise noted.     Labs: 05/29/23  CBC    Component Value Date/Time   WBC 3.6 (L) 05/29/2023 1623   RBC 4.35 05/29/2023 1623   HGB 11.7 (L) 05/29/2023 1623   HGB 12.2 04/09/2019 1439   HCT 33.9 (L) 05/29/2023 1623   HCT 37.4 04/09/2019 1439   PLT 65 (L) 05/29/2023 1623   PLT 55 (LL) 04/09/2019 1439   MCV 77.9 (L) 05/29/2023 1623   MCV 80 04/09/2019 1439   MCH 26.9 05/29/2023 1623   MCHC 34.5 05/29/2023 1623   RDW 14.9 05/29/2023 1623   RDW 18.2 (H) 04/09/2019 1439   LYMPHSABS 0.4 (L) 05/29/2023 1623   MONOABS 0.2 05/29/2023 1623   EOSABS 0.0 05/29/2023 1623   BASOSABS 0.0 05/29/2023 1623     Creatinine: 1.10 (GFR 54) Total Bilirubin: 2.3 INR: 1.3 (09/26/22) Sodium: 137 Albumin: 3.2   Child-Pugh = 9 points, class B MELD = 13 (6.0% estimated 25-month mortality) [place] Index of Post-TIPS Survival (FIPS) = 0.51 (overall survival predicted at 1 month 92.6%, 3 months 79.9%, and 6 months 67.3%)  No recent AFP on file.  Assessment  and Plan: 67 year old female with a history of decompensated cryptogenic cirrhosis (Child Pugh B9, MELD 13) with recurrent ascites.  She is taking diuretics with maximization limited by renal function (CKD3a).  Additional history significant for hematemesis treated with ~20 esophageal bands.  No recent hematemesis, never encephalopathic.    She would be an excellent candidate for TIPS creation for ascites management and risk reduction given presence of gastroesophageal varices.  Prior to the procedure, we would need to obtain an updated echocardiogram and CTA abdomen/pelvis (BRTO protocol) for procedural planning purposes.    Risks and benefits of TIPS, BRTO and/or additional variceal embolization were discussed with the patient and/or the patient's family including, but not limited to, infection, bleeding, damage to adjacent structures, worsening hepatic and/or cardiac function, worsening and/or the development of altered mental status/encephalopathy, non-target embolization and death.   We also specifically discussed initiation of lactulose after the procedure, which may be indefinite.    All of the patient's questions were answered, patient is agreeable to proceed.  -plan for TIPS creation with general anesthesia at [facility] -obtain echocardiogram and CTA abdomen/pelvis (BRTO protocol) prior to the procedure day      Marliss Coots, MD Pager: [phone] Clinic: [phone]    I spent a total of  30 Minutes   in face to face in clinical consultation, greater than 50% of which was counseling/coordinating care for portal hypertension.

## 2023-06-24 ENCOUNTER — Other Ambulatory Visit: Payer: Self-pay | Admitting: Interventional Radiology

## 2023-06-24 ENCOUNTER — Other Ambulatory Visit: Payer: No Typology Code available for payment source

## 2023-06-24 ENCOUNTER — Ambulatory Visit
Admission: RE | Admit: 2023-06-24 | Discharge: 2023-06-24 | Disposition: A | Discharge status: Home / Self Care | Payer: No Typology Code available for payment source | Source: Ambulatory Visit | Attending: Internal Medicine | Admitting: Internal Medicine

## 2023-06-24 ENCOUNTER — Encounter: Payer: Self-pay | Admitting: Internal Medicine

## 2023-06-24 ENCOUNTER — Ambulatory Visit (HOSPITAL_COMMUNITY)
Admission: RE | Admit: 2023-06-24 | Discharge: 2023-06-24 | Disposition: A | Discharge status: Home / Self Care | Payer: Medicare PPO | Source: Ambulatory Visit | Attending: Internal Medicine | Admitting: Internal Medicine

## 2023-06-24 DIAGNOSIS — R188 Other ascites: Secondary | ICD-10-CM

## 2023-06-24 DIAGNOSIS — K7460 Unspecified cirrhosis of liver: Secondary | ICD-10-CM | POA: Insufficient documentation

## 2023-06-24 DIAGNOSIS — K7031 Alcoholic cirrhosis of liver with ascites: Secondary | ICD-10-CM

## 2023-06-24 HISTORY — PX: IR RADIOLOGIST EVAL & MGMT: IMG5224

## 2023-06-24 HISTORY — PX: IR PARACENTESIS: IMG2679

## 2023-06-24 MED ORDER — LIDOCAINE HCL 1 % IJ SOLN
INTRAMUSCULAR | Status: AC
  Filled 2023-06-24: qty 20

## 2023-06-24 NOTE — Procedures (Signed)
PROCEDURE SUMMARY:  Successful ultrasound guided paracentesis from the left lower quadrant.  Yielded 5.5 L of clear yellow fluid.  No immediate complications.  The patient tolerated the procedure well.   Specimen not sent for labs.  EBL < 2 mL  The patient has previously been formally evaluated by the [facility] Interventional Radiology Portal Hypertension Clinic and is being actively followed for potential future intervention.  Alwyn Ren, AGACNP-BC 06/24/2023, 3:53 PM

## 2023-06-30 ENCOUNTER — Other Ambulatory Visit: Payer: No Typology Code available for payment source

## 2023-07-13 ENCOUNTER — Other Ambulatory Visit (HOSPITAL_COMMUNITY): Payer: Self-pay | Admitting: Interventional Radiology

## 2023-07-13 ENCOUNTER — Ambulatory Visit (HOSPITAL_COMMUNITY)
Admission: RE | Admit: 2023-07-13 | Discharge: 2023-07-13 | Disposition: A | Discharge status: Home / Self Care | Payer: Medicare PPO | Source: Ambulatory Visit | Attending: Interventional Radiology | Admitting: Interventional Radiology

## 2023-07-13 DIAGNOSIS — R188 Other ascites: Secondary | ICD-10-CM

## 2023-07-13 DIAGNOSIS — I361 Nonrheumatic tricuspid (valve) insufficiency: Secondary | ICD-10-CM | POA: Diagnosis not present

## 2023-07-13 DIAGNOSIS — I351 Nonrheumatic aortic (valve) insufficiency: Secondary | ICD-10-CM | POA: Diagnosis not present

## 2023-07-13 DIAGNOSIS — I340 Nonrheumatic mitral (valve) insufficiency: Secondary | ICD-10-CM

## 2023-07-13 DIAGNOSIS — Z01810 Encounter for preprocedural cardiovascular examination: Secondary | ICD-10-CM | POA: Diagnosis present

## 2023-07-13 DIAGNOSIS — I083 Combined rheumatic disorders of mitral, aortic and tricuspid valves: Secondary | ICD-10-CM | POA: Diagnosis not present

## 2023-07-13 DIAGNOSIS — K7031 Alcoholic cirrhosis of liver with ascites: Secondary | ICD-10-CM | POA: Diagnosis not present

## 2023-07-13 DIAGNOSIS — I119 Hypertensive heart disease without heart failure: Secondary | ICD-10-CM | POA: Diagnosis not present

## 2023-07-13 DIAGNOSIS — E119 Type 2 diabetes mellitus without complications: Secondary | ICD-10-CM | POA: Insufficient documentation

## 2023-07-13 LAB — ECHOCARDIOGRAM COMPLETE
AR max vel: 1.78 cm2
AV Area VTI: 1.79 cm2
AV Area mean vel: 1.81 cm2
AV Mean grad: 5 mm[Hg]
AV Peak grad: 9.9 mm[Hg]
Ao pk vel: 1.57 m/s
Area-P 1/2: 2.86 cm2
Calc EF: 62.7 %
MV VTI: 3.07 cm2
S' Lateral: 2.3 cm
Single Plane A2C EF: 62.8 %
Single Plane A4C EF: 63.1 %

## 2023-07-13 NOTE — Progress Notes (Signed)
*  PRELIMINARY RESULTS* Echocardiogram 2D Echocardiogram has been performed.  Carolyne Fiscal 07/13/2023, 10:54 AM

## 2023-07-14 ENCOUNTER — Ambulatory Visit (HOSPITAL_COMMUNITY)
Admission: RE | Admit: 2023-07-14 | Discharge: 2023-07-14 | Disposition: A | Discharge status: Home / Self Care | Payer: Medicare PPO | Source: Ambulatory Visit | Attending: Interventional Radiology | Admitting: Interventional Radiology

## 2023-07-14 ENCOUNTER — Ambulatory Visit (HOSPITAL_COMMUNITY)
Admission: RE | Admit: 2023-07-14 | Discharge: 2023-07-14 | Disposition: A | Discharge status: Home / Self Care | Payer: Medicare PPO | Source: Ambulatory Visit | Attending: Internal Medicine | Admitting: Internal Medicine

## 2023-07-14 DIAGNOSIS — R188 Other ascites: Secondary | ICD-10-CM | POA: Insufficient documentation

## 2023-07-14 DIAGNOSIS — K766 Portal hypertension: Secondary | ICD-10-CM | POA: Diagnosis not present

## 2023-07-14 DIAGNOSIS — K7460 Unspecified cirrhosis of liver: Secondary | ICD-10-CM | POA: Diagnosis not present

## 2023-07-14 DIAGNOSIS — K7031 Alcoholic cirrhosis of liver with ascites: Secondary | ICD-10-CM

## 2023-07-14 HISTORY — PX: IR PARACENTESIS: IMG2679

## 2023-07-14 MED ORDER — IOHEXOL 350 MG/ML SOLN
75.0000 mL | Freq: Once | INTRAVENOUS | Status: AC | PRN
  Administered 2023-07-14: 75 mL via INTRAVENOUS

## 2023-07-14 MED ORDER — LIDOCAINE HCL 1 % IJ SOLN: 20.0000 mL | Freq: Once | INTRAMUSCULAR | Status: DC

## 2023-07-14 NOTE — Procedures (Signed)
 PROCEDURE SUMMARY:  Successful ultrasound guided paracentesis from the left lower quadrant.  Yielded 5.5 L of clear yellow fluid.  No immediate complications.  The patient tolerated the procedure well.   Specimen not sent for labs.  EBL < 2 mL  The patient has previously been formally evaluated by the [facility] Interventional Radiology Portal Hypertension Clinic and is being actively followed for potential future intervention.  ** Patient scheduled for TIPS 07/28/23  Warren Dais, AGACNP-BC 07/14/2023, 1:06 PM

## 2023-07-15 LAB — POCT I-STAT CREATININE: Creatinine, Ser: 1 mg/dL (ref 0.44–1.00)

## 2023-07-26 ENCOUNTER — Other Ambulatory Visit: Payer: Self-pay

## 2023-07-26 ENCOUNTER — Ambulatory Visit (HOSPITAL_COMMUNITY)
Admission: RE | Admit: 2023-07-26 | Discharge: 2023-07-26 | Discharge status: Home / Self Care | Payer: No Typology Code available for payment source | Source: Ambulatory Visit | Attending: Internal Medicine

## 2023-07-26 DIAGNOSIS — K7460 Unspecified cirrhosis of liver: Secondary | ICD-10-CM | POA: Insufficient documentation

## 2023-07-26 DIAGNOSIS — R188 Other ascites: Secondary | ICD-10-CM | POA: Insufficient documentation

## 2023-07-26 HISTORY — PX: IR PARACENTESIS: IMG2679

## 2023-07-26 MED ORDER — LIDOCAINE HCL 1 % IJ SOLN
INTRAMUSCULAR | Status: AC
  Filled 2023-07-26: qty 20

## 2023-07-26 MED ORDER — LIDOCAINE HCL 1 % IJ SOLN
20.0000 mL | Freq: Once | INTRAMUSCULAR | Status: AC
  Administered 2023-07-26: 10 mL via INTRADERMAL

## 2023-07-26 NOTE — Procedures (Signed)
PROCEDURE SUMMARY:  Successful ultrasound guided paracentesis from the right lower quadrant.  Yielded 5.4 L of clear yellow fluid.  No immediate complications.  The patient tolerated the procedure well.   Specimen not sent for labs.  EBL < 2 mL  ** Patient scheduled for TIPS 07/28/23 with Dr. Cathi Roan, AGACNP-BC 07/26/2023, 3:01 PM

## 2023-07-26 NOTE — Progress Notes (Signed)
PC[facility] - [facility] Dr. Pearson Grippe Cardiologist - denies  PPM/ICD - denies   Chest x-ray - 09/23/22 EKG - 05/29/23 Stress Test - denies ECHO - 07/13/23 Cardiac Cath - denies  CPAP - Mild OSA, pt unable to tolerate CPAP   DM- denies  ASA/Blood Thinner Instructions:  n/a   ERAS Protcol - clears until 0700  COVID TEST- n/a  Anesthesia review: no  Patient verbally denies any shortness of breath, fever, cough and chest pain during phone call      Questions were answered. Patient verbalized understanding of instructions.

## 2023-07-26 NOTE — Pre-Procedure Instructions (Signed)
-------------    SDW INSTRUCTIONS given:  Your procedure is scheduled on 1/23.  Report to [facility] Main Entrance "A" at 07:30 A.M., and check in at the Admitting office.  Any questions or running late day of surgery: call [phone]    Remember:  Do not eat after midnight the night before your surgery  You may drink clear liquids until 07:00 AM the morning of your surgery.   Clear liquids allowed are: Water, Non-Citrus Juices (without pulp), Carbonated Beverages, Clear Tea, Black Coffee Only, and Gatorade    Take these medicines the morning of surgery with A SIP OF WATER  Cipro ear drops Zofran PRN Meclizine    As of today, STOP taking any Aspirin (unless otherwise instructed by your surgeon) Aleve, Naproxen, Ibuprofen, Motrin, Advil, Goody's, BC's, all herbal medications, fish oil, and all vitamins.   Do NOT Smoke (Tobacco/Vaping) 24 hours prior to your procedure  If you use a CPAP at night, you may bring all equipment for your overnight stay.     You will be asked to remove any contacts, glasses, piercing's, hearing aid's, dentures/partials prior to surgery. Please bring cases for these items if needed.     Patients discharged the day of surgery will not be allowed to drive home, and someone needs to stay with them for 24 hours.  SURGICAL WAITING ROOM VISITATION Patients may have no more than 2 support people in the waiting area - these visitors may rotate.   Pre-op nurse will coordinate an appropriate time for 1 ADULT support person, who may not rotate, to accompany patient in pre-op.  Children under the age of 24 must have an adult with them who is not the patient and must remain in the main waiting area with an adult.  If the patient needs to stay at the hospital during part of their recovery, the visitor guidelines for inpatient rooms apply.  Please refer to the [facility] website for the visitor guidelines for any additional information.   Special instructions:    [facility]- Preparing For Surgery   Please follow these instructions carefully.   Shower the NIGHT BEFORE SURGERY and the MORNING OF SURGERY with DIAL Soap.   Pat yourself dry with a CLEAN TOWEL.  Wear CLEAN PAJAMAS to bed the night before surgery  Place CLEAN SHEETS on your bed the night of your first shower and DO NOT SLEEP WITH PETS.   Additional instructions for the day of surgery: DO NOT APPLY any lotions, deodorants, cologne, or perfumes.   Do not wear jewelry or makeup Do not wear nail polish, gel polish, artificial nails, or any other type of covering on natural nails (fingers and toes) Do not bring valuables to the hospital. [facility] is not responsible for valuables/personal belongings. Put on clean/comfortable clothes.  Please brush your teeth.  Ask your nurse before applying any prescription medications to the skin.

## 2023-07-27 ENCOUNTER — Other Ambulatory Visit: Payer: Self-pay | Admitting: Radiology

## 2023-07-27 DIAGNOSIS — K7460 Unspecified cirrhosis of liver: Secondary | ICD-10-CM

## 2023-07-27 NOTE — Anesthesia Preprocedure Evaluation (Addendum)
Anesthesia Evaluation  Patient identified by MRN, date of birth, ID band Patient awake    Reviewed: Allergy & Precautions, NPO status , Patient's Chart, lab work & pertinent test results  Airway Mallampati: III  TM Distance: >3 FB Neck ROM: Full  Mouth opening: Limited Mouth Opening  Dental  (+) Edentulous Upper, Edentulous Lower   Pulmonary neg pulmonary ROS, sleep apnea    Pulmonary exam normal breath sounds clear to auscultation       Cardiovascular hypertension, Pt. on medications Normal cardiovascular exam Rhythm:Regular Rate:Normal  ECHO 1/25   1. Left ventricular ejection fraction, by estimation, is 60 to 65%. The  left ventricle has normal function. The left ventricle has no regional  wall motion abnormalities. There is mild concentric left ventricular  hypertrophy. Left ventricular diastolic  parameters are consistent with Grade I diastolic dysfunction (impaired  relaxation).   2. Right ventricular systolic function is normal. The right ventricular  size is normal. There is normal pulmonary artery systolic pressure.   3. Left atrial size was severely dilated.   4. Right atrial size was moderately dilated.   5. The mitral valve is normal in structure. Mild mitral valve  regurgitation. No evidence of mitral stenosis.   6. Tricuspid valve regurgitation is mild to moderate.   7. The aortic valve is tricuspid. Aortic valve regurgitation is mild.  Aortic valve sclerosis/calcification is present, without any evidence of  aortic stenosis.      Neuro/Psych negative neurological ROS  negative psych ROS   GI/Hepatic ,,,(+) Cirrhosis   Esophageal Varices    Last scope 2020 had many bands GIB: melena   Endo/Other  negative endocrine ROS    Renal/GU negative Renal ROS  negative genitourinary   Musculoskeletal  (+) Arthritis , Osteoarthritis,    Abdominal   Peds  Hematology  (+) Blood dyscrasia, anemia Hb  10.6, plt 73   Anesthesia Other Findings   Reproductive/Obstetrics negative OB ROS                             Anesthesia Physical Anesthesia Plan  ASA: 3  Anesthesia Plan: General   Post-op Pain Management: Minimal or no pain anticipated   Induction: Intravenous  PONV Risk Score and Plan: 3 and Ondansetron, Dexamethasone and Midazolam  Airway Management Planned: Oral ETT  Additional Equipment: Arterial line  Intra-op Plan:   Post-operative Plan: Extubation in OR  Informed Consent: I have reviewed the patients History and Physical, chart, labs and discussed the procedure including the risks, benefits and alternatives for the proposed anesthesia with the patient or authorized representative who has indicated his/her understanding and acceptance.       Plan Discussed with: CRNA and Anesthesiologist  Anesthesia Plan Comments: (  2 LB IV's )        Anesthesia Quick Evaluation

## 2023-07-28 ENCOUNTER — Inpatient Hospital Stay (HOSPITAL_COMMUNITY): Payer: Self-pay | Admitting: Certified Registered Nurse Anesthetist

## 2023-07-28 ENCOUNTER — Inpatient Hospital Stay (HOSPITAL_COMMUNITY)
Admission: RE | Admit: 2023-07-28 | Discharge: 2023-07-29 | DRG: 406 | Disposition: A | Discharge status: Home / Self Care | Payer: Medicare PPO | Attending: Interventional Radiology | Admitting: Interventional Radiology

## 2023-07-28 ENCOUNTER — Encounter (HOSPITAL_COMMUNITY)
Admission: RE | Disposition: A | Discharge status: Home / Self Care | Payer: Self-pay | Source: Home / Self Care | Attending: Interventional Radiology

## 2023-07-28 ENCOUNTER — Other Ambulatory Visit: Payer: Self-pay

## 2023-07-28 ENCOUNTER — Other Ambulatory Visit: Payer: Self-pay | Admitting: Radiology

## 2023-07-28 ENCOUNTER — Encounter (HOSPITAL_COMMUNITY): Payer: Self-pay | Admitting: Interventional Radiology

## 2023-07-28 ENCOUNTER — Inpatient Hospital Stay (HOSPITAL_COMMUNITY): Payer: Medicare PPO

## 2023-07-28 DIAGNOSIS — I8510 Secondary esophageal varices without bleeding: Secondary | ICD-10-CM | POA: Diagnosis present

## 2023-07-28 DIAGNOSIS — I129 Hypertensive chronic kidney disease with stage 1 through stage 4 chronic kidney disease, or unspecified chronic kidney disease: Secondary | ICD-10-CM | POA: Diagnosis present

## 2023-07-28 DIAGNOSIS — E739 Lactose intolerance, unspecified: Secondary | ICD-10-CM | POA: Diagnosis present

## 2023-07-28 DIAGNOSIS — K7290 Hepatic failure, unspecified without coma: Secondary | ICD-10-CM

## 2023-07-28 DIAGNOSIS — K766 Portal hypertension: Secondary | ICD-10-CM | POA: Diagnosis present

## 2023-07-28 DIAGNOSIS — R519 Headache, unspecified: Secondary | ICD-10-CM | POA: Diagnosis not present

## 2023-07-28 DIAGNOSIS — Z90711 Acquired absence of uterus with remaining cervical stump: Secondary | ICD-10-CM

## 2023-07-28 DIAGNOSIS — N1831 Chronic kidney disease, stage 3a: Secondary | ICD-10-CM | POA: Diagnosis present

## 2023-07-28 DIAGNOSIS — Z96653 Presence of artificial knee joint, bilateral: Secondary | ICD-10-CM | POA: Diagnosis present

## 2023-07-28 DIAGNOSIS — I10 Essential (primary) hypertension: Secondary | ICD-10-CM | POA: Diagnosis not present

## 2023-07-28 DIAGNOSIS — Z885 Allergy status to narcotic agent status: Secondary | ICD-10-CM

## 2023-07-28 DIAGNOSIS — K7031 Alcoholic cirrhosis of liver with ascites: Secondary | ICD-10-CM

## 2023-07-28 DIAGNOSIS — R188 Other ascites: Secondary | ICD-10-CM | POA: Diagnosis present

## 2023-07-28 DIAGNOSIS — Z79899 Other long term (current) drug therapy: Secondary | ICD-10-CM

## 2023-07-28 DIAGNOSIS — K7460 Unspecified cirrhosis of liver: Secondary | ICD-10-CM

## 2023-07-28 DIAGNOSIS — K7469 Other cirrhosis of liver: Secondary | ICD-10-CM | POA: Diagnosis present

## 2023-07-28 DIAGNOSIS — G4730 Sleep apnea, unspecified: Secondary | ICD-10-CM

## 2023-07-28 DIAGNOSIS — Z95828 Presence of other vascular implants and grafts: Principal | ICD-10-CM

## 2023-07-28 HISTORY — PX: IR TIPS: IMG2295

## 2023-07-28 HISTORY — PX: IR US GUIDE VASC ACCESS RIGHT: IMG2390

## 2023-07-28 HISTORY — DX: Unspecified osteoarthritis, unspecified site: M19.90

## 2023-07-28 HISTORY — DX: Sleep apnea, unspecified: G47.30

## 2023-07-28 HISTORY — PX: TIPS PROCEDURE: SHX808

## 2023-07-28 LAB — PROTIME-INR
INR: 1.1 (ref 0.8–1.2)
Prothrombin Time: 14.6 s (ref 11.4–15.2)

## 2023-07-28 LAB — CBC
HCT: 42.1 % (ref 36.0–46.0)
Hemoglobin: 14.1 g/dL (ref 12.0–15.0)
MCH: 26.7 pg (ref 26.0–34.0)
MCHC: 33.5 g/dL (ref 30.0–36.0)
MCV: 79.6 fL — ABNORMAL LOW (ref 80.0–100.0)
Platelets: 79 10*3/uL — ABNORMAL LOW (ref 150–400)
RBC: 5.29 MIL/uL — ABNORMAL HIGH (ref 3.87–5.11)
RDW: 15.1 % (ref 11.5–15.5)
WBC: 2.9 10*3/uL — ABNORMAL LOW (ref 4.0–10.5)
nRBC: 0 % (ref 0.0–0.2)

## 2023-07-28 LAB — TYPE AND SCREEN
ABO/RH(D): A POS
Antibody Screen: NEGATIVE

## 2023-07-28 LAB — COMPREHENSIVE METABOLIC PANEL
ALT: 16 U/L (ref 0–44)
AST: 38 U/L (ref 15–41)
Albumin: 3.2 g/dL — ABNORMAL LOW (ref 3.5–5.0)
Alkaline Phosphatase: 70 U/L (ref 38–126)
Anion gap: 9 (ref 5–15)
BUN: 22 mg/dL (ref 8–23)
CO2: 21 mmol/L — ABNORMAL LOW (ref 22–32)
Calcium: 8.9 mg/dL (ref 8.9–10.3)
Chloride: 108 mmol/L (ref 98–111)
Creatinine, Ser: 1.18 mg/dL — ABNORMAL HIGH (ref 0.44–1.00)
GFR, Estimated: 50 mL/min — ABNORMAL LOW (ref >60)
Glucose, Bld: 110 mg/dL — ABNORMAL HIGH (ref 70–99)
Potassium: 3.9 mmol/L (ref 3.5–5.1)
Sodium: 138 mmol/L (ref 135–145)
Total Bilirubin: 1.4 mg/dL — ABNORMAL HIGH (ref 0.0–1.2)
Total Protein: 7 g/dL (ref 6.5–8.1)

## 2023-07-28 LAB — GLUCOSE, CAPILLARY: Glucose-Capillary: 209 mg/dL — ABNORMAL HIGH (ref 70–99)

## 2023-07-28 SURGERY — INSERTION, SHUNT, INTRAHEPATIC PORTOSYSTEMIC, TRANSJUGULAR
Anesthesia: General

## 2023-07-28 MED ORDER — ACETAMINOPHEN 500 MG PO TABS
1000.0000 mg | ORAL_TABLET | Freq: Four times a day (QID) | ORAL | Status: DC | PRN
  Administered 2023-07-28 – 2023-07-29 (×3): 1000 mg via ORAL
  Filled 2023-07-28 (×3): qty 2

## 2023-07-28 MED ORDER — LIDOCAINE 2% (20 MG/ML) 5 ML SYRINGE
INTRAMUSCULAR | Status: DC | PRN
  Administered 2023-07-28: 100 mg via INTRAVENOUS

## 2023-07-28 MED ORDER — CHLORHEXIDINE GLUCONATE 0.12 % MT SOLN
OROMUCOSAL | Status: AC
  Administered 2023-07-28: 15 mL via OROMUCOSAL
  Filled 2023-07-28: qty 15

## 2023-07-28 MED ORDER — ONDANSETRON HCL 4 MG/2ML IJ SOLN: 4.0000 mg | Freq: Once | INTRAMUSCULAR | Status: DC | PRN

## 2023-07-28 MED ORDER — ONDANSETRON HCL 4 MG/2ML IJ SOLN
4.0000 mg | Freq: Four times a day (QID) | INTRAMUSCULAR | Status: DC | PRN
  Administered 2023-07-28: 4 mg via INTRAVENOUS
  Filled 2023-07-28: qty 2

## 2023-07-28 MED ORDER — MECLIZINE HCL 25 MG PO TABS
25.0000 mg | ORAL_TABLET | Freq: Four times a day (QID) | ORAL | Status: DC
  Administered 2023-07-28 – 2023-07-29 (×3): 25 mg via ORAL
  Filled 2023-07-28 (×5): qty 1

## 2023-07-28 MED ORDER — LACTULOSE 10 GM/15ML PO SOLN
20.0000 g | Freq: Three times a day (TID) | ORAL | Status: DC
  Administered 2023-07-28 – 2023-07-29 (×3): 20 g via ORAL
  Filled 2023-07-28 (×4): qty 30

## 2023-07-28 MED ORDER — FENTANYL CITRATE (PF) 250 MCG/5ML IJ SOLN
INTRAMUSCULAR | Status: AC
  Filled 2023-07-28: qty 5

## 2023-07-28 MED ORDER — CHLORHEXIDINE GLUCONATE 0.12 % MT SOLN: 15.0000 mL | Freq: Once | OROMUCOSAL | Status: AC

## 2023-07-28 MED ORDER — LACTATED RINGERS IV SOLN: INTRAVENOUS | Status: DC

## 2023-07-28 MED ORDER — FENTANYL CITRATE (PF) 100 MCG/2ML IJ SOLN: 25.0000 ug | INTRAMUSCULAR | Status: DC | PRN

## 2023-07-28 MED ORDER — ROCURONIUM BROMIDE 10 MG/ML (PF) SYRINGE
PREFILLED_SYRINGE | INTRAVENOUS | Status: DC | PRN
  Administered 2023-07-28: 70 mg via INTRAVENOUS

## 2023-07-28 MED ORDER — TRAMADOL HCL 50 MG PO TABS
100.0000 mg | ORAL_TABLET | Freq: Four times a day (QID) | ORAL | Status: DC | PRN
  Administered 2023-07-28 – 2023-07-29 (×5): 100 mg via ORAL
  Filled 2023-07-28 (×5): qty 2

## 2023-07-28 MED ORDER — CHLORHEXIDINE GLUCONATE 0.12 % MT SOLN: 15.0000 mL | Freq: Once | OROMUCOSAL | Status: DC

## 2023-07-28 MED ORDER — LIDOCAINE-EPINEPHRINE 1 %-1:100000 IJ SOLN
INTRAMUSCULAR | Status: AC
  Filled 2023-07-28: qty 1

## 2023-07-28 MED ORDER — ACETAMINOPHEN 325 MG PO TABS: 325.0000 mg | ORAL_TABLET | ORAL | Status: DC | PRN

## 2023-07-28 MED ORDER — ORAL CARE MOUTH RINSE: 15.0000 mL | Freq: Once | OROMUCOSAL | Status: DC

## 2023-07-28 MED ORDER — FUROSEMIDE 40 MG PO TABS
40.0000 mg | ORAL_TABLET | Freq: Every day | ORAL | Status: DC
  Administered 2023-07-28 – 2023-07-29 (×2): 40 mg via ORAL
  Filled 2023-07-28 (×2): qty 1

## 2023-07-28 MED ORDER — SUGAMMADEX SODIUM 200 MG/2ML IV SOLN
INTRAVENOUS | Status: DC | PRN
  Administered 2023-07-28: 200 mg via INTRAVENOUS

## 2023-07-28 MED ORDER — MIDAZOLAM HCL 2 MG/2ML IJ SOLN
INTRAMUSCULAR | Status: DC | PRN
  Administered 2023-07-28: 2 mg via INTRAVENOUS

## 2023-07-28 MED ORDER — ORAL CARE MOUTH RINSE
15.0000 mL | Freq: Once | OROMUCOSAL | Status: AC
Start: 2023-07-28 — End: 2023-07-28

## 2023-07-28 MED ORDER — PHENYLEPHRINE HCL-NACL 20-0.9 MG/250ML-% IV SOLN
INTRAVENOUS | Status: DC | PRN
  Administered 2023-07-28: 40 ug/min via INTRAVENOUS

## 2023-07-28 MED ORDER — OXYCODONE HCL 5 MG PO TABS: 5.0000 mg | ORAL_TABLET | Freq: Once | ORAL | Status: DC | PRN

## 2023-07-28 MED ORDER — ACETAMINOPHEN 160 MG/5ML PO SOLN: 325.0000 mg | ORAL | Status: DC | PRN

## 2023-07-28 MED ORDER — SODIUM CHLORIDE 0.9 % IV SOLN
2.0000 g | INTRAVENOUS | Status: AC
  Administered 2023-07-28: 2 g via INTRAVENOUS
  Filled 2023-07-28: qty 20

## 2023-07-28 MED ORDER — FENTANYL CITRATE (PF) 250 MCG/5ML IJ SOLN
INTRAMUSCULAR | Status: DC | PRN
  Administered 2023-07-28: 50 ug via INTRAVENOUS
  Administered 2023-07-28: 100 ug via INTRAVENOUS
  Administered 2023-07-28: 50 ug via INTRAVENOUS

## 2023-07-28 MED ORDER — MIDAZOLAM HCL 2 MG/2ML IJ SOLN
INTRAMUSCULAR | Status: AC
  Filled 2023-07-28: qty 2

## 2023-07-28 MED ORDER — SODIUM CHLORIDE 0.9 % IV SOLN: INTRAVENOUS | Status: DC

## 2023-07-28 MED ORDER — LACTATED RINGERS IV SOLN: INTRAVENOUS | Status: DC | PRN

## 2023-07-28 MED ORDER — PROPOFOL 10 MG/ML IV BOLUS
INTRAVENOUS | Status: DC | PRN
  Administered 2023-07-28: 150 mg via INTRAVENOUS

## 2023-07-28 MED ORDER — TRAMADOL HCL 50 MG PO TABS: 50.0000 mg | ORAL_TABLET | Freq: Four times a day (QID) | ORAL | Status: DC | PRN

## 2023-07-28 MED ORDER — ONDANSETRON HCL 4 MG/2ML IJ SOLN
INTRAMUSCULAR | Status: DC | PRN
  Administered 2023-07-28: 4 mg via INTRAVENOUS

## 2023-07-28 MED ORDER — OXYCODONE HCL 5 MG PO TABS: 10.0000 mg | ORAL_TABLET | ORAL | Status: DC | PRN

## 2023-07-28 MED ORDER — OXYCODONE HCL 5 MG PO TABS: 5.0000 mg | ORAL_TABLET | ORAL | Status: DC | PRN

## 2023-07-28 MED ORDER — HYDRALAZINE HCL 20 MG/ML IJ SOLN: 10.0000 mg | Freq: Four times a day (QID) | INTRAMUSCULAR | Status: DC | PRN

## 2023-07-28 MED ORDER — MEPERIDINE HCL 25 MG/ML IJ SOLN: 6.2500 mg | INTRAMUSCULAR | Status: DC | PRN

## 2023-07-28 MED ORDER — DEXAMETHASONE SODIUM PHOSPHATE 10 MG/ML IJ SOLN
INTRAMUSCULAR | Status: DC | PRN
  Administered 2023-07-28: 10 mg via INTRAVENOUS

## 2023-07-28 MED ORDER — CIPROFLOXACIN HCL 0.3 % OP SOLN
1.0000 [drp] | Freq: Four times a day (QID) | OPHTHALMIC | Status: DC
  Administered 2023-07-28 – 2023-07-29 (×2): 1 [drp] via OTIC
  Filled 2023-07-28: qty 2.5

## 2023-07-28 MED ORDER — OXYCODONE HCL 5 MG/5ML PO SOLN: 5.0000 mg | Freq: Once | ORAL | Status: DC | PRN

## 2023-07-28 MED ORDER — IOHEXOL 300 MG/ML  SOLN
150.0000 mL | Freq: Once | INTRAMUSCULAR | Status: AC | PRN
  Administered 2023-07-28: 60 mL via INTRAVENOUS

## 2023-07-28 NOTE — Transfer of Care (Signed)
Immediate Anesthesia Transfer of Care Note  Patient: [name]  Procedure(s) Performed: TRANS-JUGULAR INTRAHEPATIC PORTAL SHUNT (TIPS)  Patient Location: PACU  Anesthesia Type:General  Level of Consciousness: awake, alert , and oriented  Airway & Oxygen Therapy: Patient Spontanous Breathing and Patient connected to face mask oxygen  Post-op Assessment: Report given to RN and Post -op Vital signs reviewed and stable  Post vital signs: Reviewed and stable  Last Vitals:  Vitals Value Taken Time  BP 120/71 07/28/23 1245  Temp 36.7 C 07/28/23 1240  Pulse 82 07/28/23 1252  Resp 16 07/28/23 1252  SpO2 94 % 07/28/23 1252  Vitals shown include unfiled device data.  Last Pain:  Vitals:   07/28/23 0757  PainSc: 0-No pain         Complications: No notable events documented.

## 2023-07-28 NOTE — Procedures (Signed)
Interventional Radiology Procedure Note  Procedure: TIPS creation  Findings: Please refer to procedural dictation for full description. Right hepatic vein to right portal vein 7+2 cm Viatorr.  Mean portosystemic gradient of 21 mmHg (RA 14, MPV 35) reduced to 9 mmHg (RA 17, MPV 26).    Complications: None immediate  Estimated Blood Loss: < 5 mL  Recommendations: Admit to IR overnight for observation. Continue home medications. Add lactulose TID. CBC, CMP, INR in am.   Marliss Coots, MD Pager: [phone] Clinic: [phone]

## 2023-07-28 NOTE — Sedation Documentation (Signed)
Patient transported to PACU with this RN and CRNA. Bedside report given, sites assessed. Right fem level 0, clean, dry, intact, soft and no hematoma.

## 2023-07-28 NOTE — H&P (Signed)
Chief Complaint: Patient was seen in consultation today for cirrhosis with portal hypertension  Referring Physician(s): Dr. Lorenso Quarry  Supervising Physician: Marliss Coots  Patient Status: [facility] - Out-pt  History of Present Illness: [name] is a 68 y.o. female with a medical history significant for HTN, iron deficiency anemia, ventral hernia and irritable bowel syndrome. She also has a history of decompensated cryptogenic cirrhosis with portal hypertension, esophageal varices s/p banding and recurrent large volume ascites. She has been hospitalized several times in the past year with abdominal distention, GI bleed and renal insufficiency. An EGD 09/27/22 showed Grade I varices with portal hypertensive gastropathy. She has been seen in IR for numerous paracenteses in the past year with an average removal of 4-5 L.   The patient was referred to Interventional Radiology by her GI team to discuss TIPS creation for management of her portal hypertension. She met with Dr. Elby Showers 06/24/23 where she reported limitations with diuretics due to worsening renal function and the hope that a TIPS will help manage her ascites. They discussed the risks, benefits, alternatives and procedural expectations of TIPS. She expressed a desire to proceed and underwent additional pre-procedural work up including an echocardiogram and CTA abdomen/pelvis. These studies confirmed her candidacy for TIPS.   Past Medical History:  Diagnosis Date   Arthritis    hands, and back   Hypertension    Iron deficiency anemia due to chronic blood loss 03/02/2019   Irritable bowel    Liver cirrhosis (HCC)    Sleep apnea    mild, pt unable to tolerate CPAP    Past Surgical History:  Procedure Laterality Date   BIOPSY  09/27/2022   Procedure: BIOPSY;  Surgeon: Lynann Bologna, DO;  Location: [facility] ENDOSCOPY;  Service: Gastroenterology;;   ESOPHAGOGASTRODUODENOSCOPY N/A 02/17/2019   Procedure: ESOPHAGOGASTRODUODENOSCOPY  (EGD);  Surgeon: Toney Reil, MD;  Location: [facility] ENDOSCOPY;  Service: Gastroenterology;  Laterality: N/A;   ESOPHAGOGASTRODUODENOSCOPY (EGD) WITH PROPOFOL N/A 05/25/2019   Procedure: ESOPHAGOGASTRODUODENOSCOPY (EGD) WITH PROPOFOL;  Surgeon: Toney Reil, MD;  Location: [facility] ENDOSCOPY;  Service: Gastroenterology;  Laterality: N/A;   ESOPHAGOGASTRODUODENOSCOPY (EGD) WITH PROPOFOL N/A 09/27/2022   Procedure: ESOPHAGOGASTRODUODENOSCOPY (EGD) WITH PROPOFOL;  Surgeon: Lynann Bologna, DO;  Location: [facility] ENDOSCOPY;  Service: Gastroenterology;  Laterality: N/A;   IR PARACENTESIS  09/24/2022   IR PARACENTESIS  09/27/2022   IR PARACENTESIS  01/18/2023   IR PARACENTESIS  04/07/2023   IR PARACENTESIS  05/06/2023   IR PARACENTESIS  05/20/2023   IR PARACENTESIS  06/10/2023   IR PARACENTESIS  06/24/2023   IR PARACENTESIS  07/14/2023   IR PARACENTESIS  07/26/2023   IR RADIOLOGIST EVAL & MGMT  06/24/2023   PARTIAL HYSTERECTOMY     SHOULDER ARTHROTOMY     TOTAL KNEE ARTHROPLASTY Bilateral     Allergies: Hydrocodone-acetaminophen, Codeine, and Lactose intolerance (gi)  Medications: Prior to Admission medications   Medication Sig Start Date End Date Taking? Authorizing Provider  CIPROFLOXACIN HCL OT Place 1 drop in ear(s) 4 (four) times daily. 3%   Yes [provider]  furosemide (LASIX) 40 MG tablet Take 1 tablet (40 mg total) by mouth daily. 09/29/22  Yes Sheikh, Omair Latif, DO  Meclizine HCl 25 MG CHEW Chew 25 mg by mouth 4 (four) times daily. 06/10/23  Yes [provider]  ondansetron (ZOFRAN) 8 MG tablet Take 12 mg by mouth every 8 (eight) hours as needed for nausea or vomiting. 12/13/22 11/03/23 Yes [provider]  polyethylene  glycol powder (GLYCOLAX/MIRALAX) 17 GM/SCOOP powder Take 17 g by mouth daily as needed for mild constipation. 09/28/22  Yes Sheikh, Omair Latif, DO  leptospermum manuka honey (MEDIHONEY) PSTE paste Apply 1 Application topically  daily. Patient not taking: Reported on 07/21/2023 09/29/22   Marguerita Merles Latif, DO  pantoprazole (PROTONIX) 40 MG tablet Take 1 tablet (40 mg total) by mouth daily. Patient not taking: Reported on 07/21/2023 09/28/22   Marguerita Merles Latif, DO  spironolactone (ALDACTONE) 50 MG tablet Take 1 tablet (50 mg total) by mouth daily. Patient not taking: Reported on 07/21/2023 09/29/22   Marguerita Merles Latif, DO  traMADol (ULTRAM) 50 MG tablet Take 1 tablet (50 mg total) by mouth every 12 (twelve) hours as needed for moderate pain or severe pain. Patient not taking: Reported on 07/21/2023 09/28/22   Merlene Laughter, DO     Family History  Problem Relation Age of Onset   Osteoarthritis Mother    Cancer Father        cirrhosis; alcoholic   Cirrhosis Sister        alcoholic   Cirrhosis Brother        alcoholic    Social History   Socioeconomic History   Marital status: Married    Spouse name: Not on file   Number of children: Not on file   Years of education: Not on file   Highest education level: Not on file  Occupational History   Not on file  Tobacco Use   Smoking status: Never   Smokeless tobacco: Never  Vaping Use   Vaping status: Never Used  Substance and Sexual Activity   Alcohol use: No   Drug use: No   Sexual activity: Not on file  Other Topics Concern   Not on file  Social History Narrative   Lives in [place] with husband; never smoked; no current alcohol; prior ocassional alcohol. [place]   Social Drivers of Health   Financial Resource Strain: Low Risk  (04/27/2022)   [facility] to [facility] prior to 09[facility]04/[facility]22[facility]   Overall Financial Resource Strain (CARDIA)    Difficulty of Paying Living Expenses: Not very hard  Food Insecurity: No Food Insecurity (09/24/2022)   Hunger Vital Sign    Worried About Running Out of Food in the Last Year: Never  true    Ran Out of Food in the Last Year: Never true  Transportation Needs: No Transportation Needs (09/24/2022)   PRAPARE - Administrator, Civil Service (Medical): No    Lack of Transportation (Non-Medical): No  Physical Activity: Sufficiently Active (04/27/2022)   [facility] to 09/04/[facility] visits prior to 09/04/2022., [facility]   Exercise Vital Sign    Days of Exercise per Week: 5 days    Minutes of Exercise per Session: 60 min  Stress: Stress Concern Present (04/27/2022)   [facility] to 09/04/[facility]22[facility] visits prior to 09/04/2022[facility]   [employer] of Occupational Health - Occupational Stress Questionnaire    Feeling of Stress : To some extent  Social Connections: Moderately Integrated (04/27/2022)   Received from [facility] visits prior to 09/04/2022[facility], [facility] visits prior to 09/04/2022[facility]   Social  Connection and Isolation Panel [NHANES]    Frequency of Communication with Friends and Family: More than three times a week    Frequency of Social Gatherings with Friends and Family: More than three times a week    Attends Religious Services: Never    Database administrator or Organizations: Yes    Attends Banker Meetings: Never    Marital Status: Married    Review of Systems: A 12 point ROS discussed and pertinent positives are indicated in the HPI above.  All other systems are negative.  Review of Systems  Constitutional:  Negative for appetite change and fatigue.  Respiratory:  Negative for cough and shortness of breath.   Cardiovascular:  Negative for chest pain and leg swelling.  Gastrointestinal:  Negative for abdominal pain, diarrhea, nausea and vomiting.  Musculoskeletal:   Negative for back pain.  Neurological:  Positive for dizziness.  Psychiatric/Behavioral:  Negative for confusion.     Vital Signs: BP (!) 144/81   Pulse 72   Temp 97.9 F (36.6 C)   Resp 20   Ht 5\' 3"  (1.6 m)   Wt 147 lb (66.7 kg)   SpO2 100%   BMI 26.04 kg/m   Physical Exam Constitutional:      General: She is not in acute distress.    Appearance: She is not ill-appearing.  HENT:     Mouth/Throat:     Mouth: Mucous membranes are moist.     Pharynx: Oropharynx is clear.  Cardiovascular:     Rate and Rhythm: Normal rate and regular rhythm.     Pulses: Normal pulses.     Heart sounds: Normal heart sounds.  Pulmonary:     Effort: Pulmonary effort is normal.     Breath sounds: Normal breath sounds.  Abdominal:     General: Bowel sounds are normal.     Palpations: Abdomen is soft.     Tenderness: There is no abdominal tenderness.  Musculoskeletal:     Right lower leg: No edema.     Left lower leg: No edema.  Skin:    General: Skin is warm and dry.  Neurological:     Mental Status: She is alert and oriented to person, place, and time.  Psychiatric:        Mood and Affect: Mood normal.        Behavior: Behavior normal.        Thought Content: Thought content normal.        Judgment: Judgment normal.     Imaging: IR Paracentesis Result Date: 07/26/2023 INDICATION: Patient with a history of cirrhosis with recurrent ascites. Interventional radiology asked to perform a therapeutic paracentesis. EXAM: ULTRASOUND GUIDED PARACENTESIS MEDICATIONS: 1% lidocaine 10 mL COMPLICATIONS: None immediate. PROCEDURE: Informed written consent was obtained from the patient after a discussion of the risks, benefits and alternatives to treatment. A timeout was performed prior to the initiation of the procedure. Initial ultrasound scanning demonstrates a large amount of ascites within the right lower abdominal quadrant. The right lower abdomen was prepped and draped in the usual sterile  fashion. 1% lidocaine was used for local anesthesia. Following this, a 19 gauge, 7-cm, Yueh catheter was introduced. An ultrasound image was saved for documentation purposes. The paracentesis was performed. The catheter was removed and a dressing was applied. The patient tolerated the procedure well without immediate post procedural complication. FINDINGS: A total of approximately 5.4 L of clear yellow fluid was removed. IMPRESSION: Successful ultrasound-guided paracentesis yielding 5.4 liters  of peritoneal fluid. Procedure performed by Alwyn Ren NP PLAN: Patient is scheduled for TIPS 07/28/2023 Electronically Signed   By: Marliss Coots M.D.   On: 07/26/2023 15:02   CT Angio Abd/Pel w/ and/or w/o Result Date: 07/15/2023 CLINICAL DATA:  68 year old female with a history of decompensated cryptogenic cirrhosis (Child Pugh B9, MELD 13) with recurrent ascites. She is taking diuretics with maximization limited by renal function (CKD3a). Additional history significant for hematemesis treated with ~20 esophageal bands. No recent hematemesis, never encephalopathic. Pre-procedure CTA prior to TIPS creation. EXAM: CT ANGIOGRAPHY ABDOMEN AND PELVIS WITH CONTRAST AND WITHOUT CONTRAST TECHNIQUE: Multidetector CT imaging of the abdomen and pelvis was performed using the standard protocol during bolus administration of intravenous contrast. Multiplanar reconstructed images and MIPs were obtained and reviewed to evaluate the vascular anatomy. RADIATION DOSE REDUCTION: This exam was performed according to the departmental dose-optimization program which includes automated exposure control, adjustment of the mA and/or kV according to patient size and/or use of iterative reconstruction technique. CONTRAST:  75mL OMNIPAQUE IOHEXOL 350 MG/ML SOLN COMPARISON:  09/18/2018, 09/27/2022 FINDINGS: VASCULAR Aorta: Normal caliber aorta without aneurysm, dissection, vasculitis or significant stenosis. Celiac: There is a saccular aneurysm  arising from the proximal splenic artery just after its bifurcation which measures 2.5 x 2.3 x 2.0 cm (AP by trans by cc, stable since March 2024 comparison, however enlarged from 1.5 cm from 09/18/2018 comparison. Celiac trunk is normal in caliber and patent. SMA: Patent without evidence of aneurysm, dissection, vasculitis or significant stenosis. Renals: Single bilateral renal arteries are patent without evidence of aneurysm, dissection, vasculitis, fibromuscular dysplasia or significant stenosis. IMA: Patent without evidence of aneurysm, dissection, vasculitis or significant stenosis. Inflow: Patent without evidence of aneurysm, dissection, vasculitis or significant stenosis. Proximal Outflow: Bilateral common femoral and visualized portions of the superficial and profunda femoral arteries are patent without evidence of aneurysm, dissection, vasculitis or significant stenosis. Veins: The hepatic veins are widely patent. The portal system is widely patent and normal in caliber. The renal veins are patent bilaterally in standard anatomic configuration. No evidence of iliocaval thrombosis or anomaly. Review of the MIP images confirms the above findings. NON-VASCULAR Lower chest: No acute abnormality. Hepatobiliary: Similar appearing shrunken liver with diffusely nodular contour. No evidence of hepatoma. The gallbladder is present with multiple layering calcified gallstones in the infundibulum. No intra or extrahepatic biliary ductal dilation. Pancreas: Similar appearing diffuse fatty atrophy. No focal mass or ductal dilation. Spleen: Similar appearing splenomegaly measuring up to 22 cm in maximum craniocaudal dimension. No focal masses. Adrenals/Urinary Tract: Adrenal glands are unremarkable. Kidneys are normal, without renal calculi, focal lesion, or hydronephrosis. Bladder is unremarkable. Stomach/Bowel: Stomach is within normal limits. Appendix appears normal. No evidence of bowel wall thickening, distention, or  inflammatory changes. Lymphatic: No abdominopelvic lymphadenopathy. Reproductive: Status post hysterectomy. No adnexal masses. Other: Large volume ascites. Similar appearing infraumbilical hernia containing ascites. Musculoskeletal: No acute or significant osseous findings. IMPRESSION: VASCULAR 1. Patent portal venous system without evidence of the significant varices. 2. 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 NON-VASCULAR Similar morphologic changes of hepatic cirrhosis and secondary signs of portal hypertension including massive splenomegaly and large volume ascites. No hepatoma. Marliss Coots, MD [facility] Radiology Electronically Signed   By: Marliss Coots M.D.   On: 07/15/2023 14:45   IR Paracentesis Result Date: 07/14/2023 INDICATION: Patient with a history of cirrhosis with recurrent ascites. Interventional radiology asked to perform a therapeutic paracentesis. EXAM: ULTRASOUND GUIDED THERAPEUTIC PARACENTESIS  MEDICATIONS: 1% lidocaine 10 mL COMPLICATIONS: None immediate. PROCEDURE: Informed written consent was obtained from the patient after a discussion of the risks, benefits and alternatives to treatment. A timeout was performed prior to the initiation of the procedure. Initial ultrasound scanning demonstrates a large amount of ascites within the left lower abdominal quadrant. The left lower abdomen was prepped and draped in the usual sterile fashion. 1% lidocaine was used for local anesthesia. Following this, a 19 gauge, 7-cm, Yueh catheter was introduced. An ultrasound image was saved for documentation purposes. The paracentesis was performed. The catheter was removed and a dressing was applied. The patient tolerated the procedure well without immediate post procedural complication. FINDINGS: A total of approximately 5.5 L of clear yellow fluid was removed. IMPRESSION: Successful ultrasound-guided paracentesis yielding 5.5 L of  peritoneal fluid. Procedure performed by Alwyn Ren NP PLAN: The patient has previously been formally evaluated by the [facility] Interventional Radiology Portal Hypertension Clinic and is being actively followed for potential future intervention. Patient is scheduled for TIPS 07/28/2023. Roanna Banning, MD [facility] Radiology Electronically Signed   By: Roanna Banning M.D.   On: 07/14/2023 14:27   ECHOCARDIOGRAM COMPLETE Result Date: 07/13/2023    ECHOCARDIOGRAM REPORT   Patient Name:   [name] Date of Exam: 07/13/2023 Medical Rec #:  [id number]     Height:       63.0 in Accession #:    [id number]    Weight:       151.0 lb Date of Birth:  06-13-1956      BSA:          1.716 m Patient Age:    67 years      BP:           123/67 mmHg Patient Gender: F             HR:           66 bpm. Exam Location:  Outpatient Procedure: 2D Echo, Cardiac Doppler and Color Doppler Indications:    Preoperative evaluation  History:        Patient has prior history of Echocardiogram examinations, most                 recent 02/03/2019. Risk Factors:Hypertension and Diabetes.  Sonographer:    Mikki Harbor Referring Phys: [id number] QVZDG J SUTTLE IMPRESSIONS  1. Left ventricular ejection fraction, by estimation, is 60 to 65%. The left ventricle has normal function. The left ventricle has no regional wall motion abnormalities. There is mild concentric left ventricular hypertrophy. Left ventricular diastolic parameters are consistent with Grade I diastolic dysfunction (impaired relaxation).  2. Right ventricular systolic function is normal. The right ventricular size is normal. There is normal pulmonary artery systolic pressure.  3. Left atrial size was severely dilated.  4. Right atrial size was moderately dilated.  5. The mitral valve is normal in structure. Mild mitral valve regurgitation. No evidence of mitral stenosis.  6. Tricuspid valve regurgitation is mild to moderate.  7. The  aortic valve is tricuspid. Aortic valve regurgitation is mild. Aortic valve sclerosis/calcification is present, without any evidence of aortic stenosis. FINDINGS  Left Ventricle: Left ventricular ejection fraction, by estimation, is 60 to 65%. The left ventricle has normal function. The left ventricle has no regional wall motion abnormalities. The left ventricular internal cavity size was normal in size. There is  mild concentric left ventricular hypertrophy. Left ventricular diastolic parameters are consistent with Grade I diastolic  dysfunction (impaired relaxation). Right Ventricle: The right ventricular size is normal. No increase in right ventricular wall thickness. Right ventricular systolic function is normal. There is normal pulmonary artery systolic pressure. The tricuspid regurgitant velocity is 2.48 m/s, and  with an assumed right atrial pressure of 8 mmHg, the estimated right ventricular systolic pressure is 32.6 mmHg. Left Atrium: Left atrial size was severely dilated. Right Atrium: Right atrial size was moderately dilated. Pericardium: There is no evidence of pericardial effusion. Mitral Valve: The mitral valve is normal in structure. Mild mitral valve regurgitation. No evidence of mitral valve stenosis. MV peak gradient, 2.8 mmHg. The mean mitral valve gradient is 1.0 mmHg. Tricuspid Valve: The tricuspid valve is normal in structure. Tricuspid valve regurgitation is mild to moderate. No evidence of tricuspid stenosis. Aortic Valve: The aortic valve is tricuspid. Aortic valve regurgitation is mild. Aortic valve sclerosis/calcification is present, without any evidence of aortic stenosis. Aortic valve mean gradient measures 5.0 mmHg. Aortic valve peak gradient measures 9.9 mmHg. Aortic valve area, by VTI measures 1.79 cm. Pulmonic Valve: The pulmonic valve was normal in structure. Pulmonic valve regurgitation is mild. No evidence of pulmonic stenosis. Aorta: The aortic root is normal in size and  structure. Venous: The inferior vena cava was not well visualized. IAS/Shunts: No atrial level shunt detected by color flow Doppler.  LEFT VENTRICLE PLAX 2D LVIDd:         4.00 cm     Diastology LVIDs:         2.30 cm     LV e' medial:    5.44 cm/s LV PW:         0.90 cm     LV E/e' medial:  13.4 LV IVS:        1.10 cm     LV e' lateral:   9.03 cm/s LVOT diam:     2.00 cm     LV E/e' lateral: 8.1 LV SV:         71 LV SV Index:   41 LVOT Area:     3.14 cm  LV Volumes (MOD) LV vol d, MOD A2C: 63.1 ml LV vol d, MOD A4C: 65.6 ml LV vol s, MOD A2C: 23.5 ml LV vol s, MOD A4C: 24.2 ml LV SV MOD A2C:     39.6 ml LV SV MOD A4C:     65.6 ml LV SV MOD BP:      40.3 ml RIGHT VENTRICLE RV Basal diam:  3.30 cm RV Mid diam:    3.50 cm RV S prime:     8.92 cm/s TAPSE (M-mode): 1.9 cm LEFT ATRIUM             Index        RIGHT ATRIUM           Index LA diam:        4.70 cm 2.74 cm/m   RA Area:     17.20 cm LA Vol (A2C):   67.4 ml 39.28 ml/m  RA Volume:   40.30 ml  23.49 ml/m LA Vol (A4C):   87.5 ml 50.99 ml/m LA Biplane Vol: 76.0 ml 44.29 ml/m  AORTIC VALVE                    PULMONIC VALVE AV Area (Vmax):    1.78 cm     PV Vmax:       0.94 m/s AV Area (Vmean):   1.81 cm     PV  Peak grad:  3.6 mmHg AV Area (VTI):     1.79 cm AV Vmax:           157.00 cm/s AV Vmean:          99.200 cm/s AV VTI:            0.397 m AV Peak Grad:      9.9 mmHg AV Mean Grad:      5.0 mmHg LVOT Vmax:         88.80 cm/s LVOT Vmean:        57.000 cm/s LVOT VTI:          0.226 m LVOT/AV VTI ratio: 0.57  AORTA Ao Root diam: 3.20 cm MITRAL VALVE               TRICUSPID VALVE MV Area (PHT): 2.86 cm    TR Peak grad:   24.6 mmHg MV Area VTI:   3.07 cm    TR Vmax:        248.00 cm/s MV Peak grad:  2.8 mmHg MV Mean grad:  1.0 mmHg    SHUNTS MV Vmax:       0.83 m/s    Systemic VTI:  0.23 m MV Vmean:      43.8 cm/s   Systemic Diam: 2.00 cm MV Decel Time: 265 msec MV E velocity: 73.10 cm/s MV A velocity: 76.80 cm/s MV E/A ratio:  0.95 Arvilla Meres MD  Electronically signed by Arvilla Meres MD Signature Date/Time: 07/13/2023/11:07:47 AM    Final     Labs:  CBC: Recent Labs    09/27/22 0259 09/28/22 0127 05/29/23 1623 07/28/23 0755  WBC 4.2 5.2 3.6* 2.9*  HGB 10.6* 10.3* 11.7* 14.1  HCT 29.8* 29.4* 33.9* 42.1  PLT 73* 82* 65* 79*    COAGS: Recent Labs    09/11/22 1345 09/23/22 1550 09/27/22 0259 07/28/23 0755  INR 1.2 1.2 1.3* 1.1    BMP: Recent Labs    09/27/22 0259 09/28/22 0127 05/29/23 1623 07/14/23 1031 07/28/23 0755  NA 135 134* 137  --  138  K 4.0 4.0 3.7  --  3.9  CL 105 107 107  --  108  CO2 20* 20* 20*  --  21*  GLUCOSE 119* 147* 140*  --  110*  BUN 17 20 20   --  22  CALCIUM 8.2* 8.1* 8.6*  --  8.9  CREATININE 1.00 0.97 1.10* 1.00 1.18*  GFRNONAA >60 >60 54*  --  50*    LIVER FUNCTION TESTS: Recent Labs    09/27/22 0259 09/28/22 0127 05/29/23 1623 07/28/23 0755  BILITOT 1.0 1.1 2.3* 1.4*  AST 21 19 35 38  ALT 12 10 16 16   ALKPHOS 66 61 66 70  PROT 5.8* 5.4* 6.4* 7.0  ALBUMIN 2.6* 2.5* 3.2* 3.2*    TUMOR MARKERS: No results for input(s): "AFPTM", "CEA", "CA199", "CHROMGRNA" in the last 8760 hours.  Assessment and Plan:  Cirrhosis with portal hypertension, esophageal varices and refractory ascites: Shayana G. Ruma, 68 year old female, presents today to the [facility] Interventional Radiology department for an image-guided transjugular intrahepatic portosystemic shunt with possible embolization. The procedure will be performed under general anesthesia with planned overnight observation.   Risks and benefits of TIPS, BRTO and/or additional variceal embolization were discussed with the patient and/or the patient's family including, but not limited to, infection, bleeding, damage to adjacent structures, worsening hepatic and/or cardiac function, worsening and/or the development of altered mental status/encephalopathy, non-target embolization and  death.   This interventional procedure  involves the use of X-rays and because of the nature of the planned procedure, it is possible that we will have prolonged use of X-ray fluoroscopy.  Potential radiation risks to you include (but are not limited to) the following: - A slightly elevated risk for cancer  several years later in life. This risk is typically less than 0.5% percent. This risk is low in comparison to the normal incidence of human cancer, which is 33% for women and 50% for men according to the American Cancer Society. - Radiation induced injury can include skin redness, resembling a rash, tissue breakdown / ulcers and hair loss (which can be temporary or permanent).   The likelihood of either of these occurring depends on the difficulty of the procedure and whether you are sensitive to radiation due to previous procedures, disease, or genetic conditions.   IF your procedure requires a prolonged use of radiation, you will be notified and given written instructions for further action.  It is your responsibility to monitor the irradiated area for the 2 weeks following the procedure and to notify your physician if you are concerned that you have suffered a radiation induced injury.    All of the patient's questions were answered, patient is agreeable to proceed. She has been NPO. She is a full code.   Consent signed and in chart.  Thank you for this interesting consult.  I greatly enjoyed meeting JAYNA GILFILLAN and look forward to participating in their care.  A copy of this report was sent to the requesting provider on this date.  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

## 2023-07-28 NOTE — Care Management (Signed)
Transition of Care [facility]) - Inpatient Brief Assessment   Patient Details  Name: FINN WEINS MRN: [id number] Date of Birth: [birth year]  [facility] CM/SW Contact:    Lockie Pares, RN Phone Number: 07/28/2023, 3:54 PM   Clinical Narrative:  82 yea old patient presented for TIPS procedure. Has had to do paracentesis frequently. Portal hypertension esophageal varices with banding. No needs identified at this time. The patient will be discussed in progressive rounds daily. If a need is identified , please place a TOC consult   Transition of Care Asessment: Insurance and Status: Insurance coverage has been reviewed Patient has primary care physician: Yes Home environment has been reviewed: Lives with spouse Prior level of function:: Independent Prior/Current Home Services: No current home services Social Drivers of Health Review: SDOH reviewed no interventions necessary Readmission risk has been reviewed: Yes Transition of care needs: no transition of care needs at this time

## 2023-07-28 NOTE — Anesthesia Procedure Notes (Addendum)
Procedure Name: Intubation Date/Time: 07/28/2023 11:03 AM  Performed by: Allyn Kenner, CRNAPre-anesthesia Checklist: Patient identified, Emergency Drugs available, Suction available and Patient being monitored Patient Re-evaluated:Patient Re-evaluated prior to induction Oxygen Delivery Method: Circle System Utilized Preoxygenation: Pre-oxygenation with 100% oxygen Induction Type: IV induction Ventilation: Mask ventilation without difficulty Laryngoscope Size: Glidescope, Mac and 3 Grade View: Grade I Tube type: Oral Tube size: 7.0 mm Number of attempts: 1 Airway Equipment and Method: Stylet and Oral airway Placement Confirmation: ETT inserted through vocal cords under direct vision, positive ETCO2 and breath sounds checked- equal and bilateral Secured at: 19 cm Tube secured with: Tape Dental Injury: Teeth and Oropharynx as per pre-operative assessment

## 2023-07-28 NOTE — Anesthesia Postprocedure Evaluation (Signed)
Anesthesia Post Note  Patient: DESHELIA PIVIROTTO  Procedure(s) Performed: TRANS-JUGULAR INTRAHEPATIC PORTAL SHUNT (TIPS)     Patient location during evaluation: PACU Anesthesia Type: General Level of consciousness: awake and alert Pain management: pain level controlled Vital Signs Assessment: post-procedure vital signs reviewed and stable Respiratory status: spontaneous breathing, nonlabored ventilation, respiratory function stable and patient connected to nasal cannula oxygen Cardiovascular status: blood pressure returned to baseline and stable Postop Assessment: no apparent nausea or vomiting Anesthetic complications: no   There were no known notable events for this encounter.  Last Vitals:  Vitals:   07/28/23 1613 07/28/23 1943  BP: (!) 143/75 (!) 142/63  Pulse: 78 80  Resp: 18 17  Temp: 36.9 C 37 C  SpO2: 100% 99%    Last Pain:  Vitals:   07/28/23 1943  TempSrc: Oral  PainSc:                  Beila Purdie

## 2023-07-28 NOTE — Anesthesia Procedure Notes (Signed)
Arterial Line Insertion Start/End1/23/2025 9:30 AM, 07/28/2023 9:40 AM Performed by: Bethena Midget, MD, Allyn Kenner, CRNA  Patient location: Pre-op. Preanesthetic checklist: patient identified, IV checked, site marked, risks and benefits discussed, surgical consent, monitors and equipment checked, pre-op evaluation, timeout performed and anesthesia consent Lidocaine 1% used for infiltration Right, radial was placed Catheter size: 20 G Hand hygiene performed , maximum sterile barriers used  and Seldinger technique used Allen's test indicative of satisfactory collateral circulation Attempts: 1 Procedure performed without using ultrasound guided technique. Following insertion, dressing applied. Post procedure assessment: normal and unchanged

## 2023-07-28 NOTE — Sedation Documentation (Signed)
Pre TIPS pressures: RA 14, Main Portal Vein 35 Post TIPS pressures: RA 17, Main portal vein 26

## 2023-07-29 ENCOUNTER — Encounter: Payer: Self-pay | Admitting: Internal Medicine

## 2023-07-29 ENCOUNTER — Other Ambulatory Visit (HOSPITAL_COMMUNITY): Payer: Self-pay

## 2023-07-29 LAB — CBC
HCT: 38.5 % (ref 36.0–46.0)
Hemoglobin: 13.6 g/dL (ref 12.0–15.0)
MCH: 27 pg (ref 26.0–34.0)
MCHC: 35.3 g/dL (ref 30.0–36.0)
MCV: 76.4 fL — ABNORMAL LOW (ref 80.0–100.0)
Platelets: 89 10*3/uL — ABNORMAL LOW (ref 150–400)
RBC: 5.04 MIL/uL (ref 3.87–5.11)
RDW: 15.3 % (ref 11.5–15.5)
WBC: 9.4 10*3/uL (ref 4.0–10.5)
nRBC: 0 % (ref 0.0–0.2)

## 2023-07-29 LAB — COMPREHENSIVE METABOLIC PANEL
ALT: 130 U/L — ABNORMAL HIGH (ref 0–44)
AST: 264 U/L — ABNORMAL HIGH (ref 15–41)
Albumin: 2.9 g/dL — ABNORMAL LOW (ref 3.5–5.0)
Alkaline Phosphatase: 64 U/L (ref 38–126)
Anion gap: 11 (ref 5–15)
BUN: 15 mg/dL (ref 8–23)
CO2: 20 mmol/L — ABNORMAL LOW (ref 22–32)
Calcium: 8.6 mg/dL — ABNORMAL LOW (ref 8.9–10.3)
Chloride: 104 mmol/L (ref 98–111)
Creatinine, Ser: 1.02 mg/dL — ABNORMAL HIGH (ref 0.44–1.00)
GFR, Estimated: 60 mL/min — ABNORMAL LOW (ref >60)
Glucose, Bld: 170 mg/dL — ABNORMAL HIGH (ref 70–99)
Potassium: 4.3 mmol/L (ref 3.5–5.1)
Sodium: 135 mmol/L (ref 135–145)
Total Bilirubin: 2 mg/dL — ABNORMAL HIGH (ref 0.0–1.2)
Total Protein: 6.3 g/dL — ABNORMAL LOW (ref 6.5–8.1)

## 2023-07-29 LAB — PROTIME-INR
INR: 1.3 — ABNORMAL HIGH (ref 0.8–1.2)
Prothrombin Time: 16.3 s — ABNORMAL HIGH (ref 11.4–15.2)

## 2023-07-29 MED ORDER — LACTULOSE 10 GM/15ML PO SOLN
20.0000 g | Freq: Three times a day (TID) | ORAL | 2 refills | Status: AC
  Filled 2023-07-29: qty 237, 3d supply, fill #0

## 2023-07-29 NOTE — Discharge Instructions (Addendum)
-   You will follow up with Dr. Elby Showers in 1 month for ultrasound and post procedure visit after. IR schedulers will call you to schedule this. Please call [phone] with questions or concerns prior to your appointment. Alwyn Ren, NP will call you on Monday 1/27 for post procedure check in - Continue Lactulose 20g TID and monitor for 2-3 soft bowel movements per day. Please call IR patient care line (available 24/7) at [phone] if no bowel movement for 24 hours despite lactulose or if > 10 bowel movements within 24 hours.  - Monitor for signs and symptoms of hepatic encephalopathy which include:  - Forgetfulness/confusion/difficulty concentrating  - Disorientation  - Slurred speech  - Sluggish movements  - Shaking of the hands or arms *If you develop any of the above symptoms call IR patient care line (available 24/7) at [phone] immediately OR present to the ED* - You may shower immediately. Do not submerge your puncture site for 7 days (no baths, swimming, saunas, hot tubs, etc). Place a regular bandaid over the site until healed (typically ~3 days after your procedure).

## 2023-07-29 NOTE — Discharge Summary (Signed)
Patient ID: [name] MRN: [id number] DOB/AGE: Jun 17, 1956 68 y.o.  Admit date: 07/28/2023 Discharge date: 07/29/2023  Supervising Physician: Simonne Come  Patient Status: [facility] - In-pt  Admission Diagnoses: Decompensated cryptogenic cirrhosis, portal HTN, esophageal varices, recurrent ascites  Discharge Diagnoses:  Principal Problem:   S/P TIPS (transjugular intrahepatic portosystemic shunt)   Discharged Condition: good  Hospital Course: 68 y/o F who presented to IR as an outpatient on 07/28/23 for planned TIPS creation. Patient underwent successful TIPS creation that same day with Dr. Elby Showers and was admitted for overnight observation.  Patient seen at bedside this morning with multiple family members present, she did not sleep well due to being up most of the night urinating however this has improved. She had a headache overnight which improved with Tylenol and is much better today. She has not had a bowel movement yet, took 2 doses of lactulose - having the third during our conversation this morning. Eating and drinking well. Ambulating independently (with walker) to and from bathroom without issue. Reviewed puncture site care,follow up with Dr. Elby Showers in 1 month for TIPS [place] with visit after, will hear from IR NP Asher Muir on Monday for post procedure check in. Patient reminded to continue lactulose TID and to monitor for s/s of HE, if concerned instructed to call IR immediately. Patient and family with good understanding of the plan and are agreeable to discharge.  AM labs reviewed - rise in AST/ALT noted and reviewed with Dr. Elby Showers. Transient elevation of LFTs to this degree is expected and no formal follow up on this is needed unless patient shows s/s of liver failure.  Plan to discharge patient today pending bowel movement, she has flatus and denies abdominal pain. If no bowel movement by ~3 pm will proceed with discharge with instructions to call IR if no bowel movement within 24 hours  despite lactulose.  Consults: None  Significant Diagnostic Studies: IR Tips Result Date: 07/29/2023 CLINICAL DATA:  68 year old female with a history of decompensated cryptogenic cirrhosis (Child Pugh B9, MELD 13) with recurrent ascites. She is taking diuretics with maximization limited by renal function (CKD3a). Additional history significant for hematemesis treated with ~20 esophageal bands. No recent hematemesis, never encephalopathic. EXAM: 1. Ultrasound-guided access of the right internal jugular vein 2. Ultrasound-guided access of the right common femoral vein 3. Hepatic venogram 4. Intravascular ultrasound 5. Catheterization of the portal vein 6. Portal venous and central manometry 7. Portal venogram 8. Creation of a transhepatic portal vein to hepatic vein shunt MEDICATIONS: As antibiotic prophylaxis, Rocephin 1 gm IV was ordered pre-procedure and administered intravenously within one hour of incision. ANESTHESIA/SEDATION: General - as administered by the Anesthesia department CONTRAST:  Sixty ML Omnipaque 300, intravenous FLUOROSCOPY TIME:  Eighty mGy COMPLICATIONS: None immediate. PROCEDURE: Informed written consent was obtained from the patient after a thorough discussion of the procedural risks, benefits and alternatives. All questions were addressed. Maximal Sterile Barrier Technique was utilized including caps, mask, sterile gowns, sterile gloves, sterile drape, hand hygiene and skin antiseptic. A timeout was performed prior to the initiation of the procedure. A preliminary ultrasound of the right groin was performed and demonstrates a patent right common femoral vein. A permanent ultrasound image was recorded. Using a combination of fluoroscopy and ultrasound, an access site was determined. A small dermatotomy was made at the planned puncture site. Using ultrasound guidance, access into the right common femoral vein was obtained with visualization of needle entry into the vessel using a standard  micropuncture technique.  A wire was advanced into the IVC insert all fascial dilation performed. An 8 [place], 11 cm vascular sheath was placed into the external iliac vein. Through this access site, an 72 [place] ICE catheter was advanced with ease under fluoroscopic guidance to the level of the intrahepatic inferior vena cava. A preliminary ultrasound of the right neck was performed and demonstrates a patent internal jugular vein. A permanent ultrasound image was recorded. Using a combination of fluoroscopy and ultrasound, an access site was determined. A small dermatotomy was made at the planned puncture site. Using ultrasound guidance, access into the right internal jugular vein was obtained with visualization of needle entry into the vessel using a standard micropuncture technique. A wire was advanced into the IVC and serial fascial dilation performed. A 10 French tips sheath was placed into the internal jugular vein and advanced to the IVC. The jugular sheath was retracted into the right atrium and manometry was performed. A 5 French angled tip catheter was then directed into the right hepatic vein. Hepatic venogram was performed. These images demonstrated patent hepatic vein with no stenosis. The catheter was advanced to a wedge portion of the a patent vein over which the 10 French sheath was advanced into the right hepatic vein. Using ICE ultrasound visualization the catheter within the right hepatic vein as well as the portal anatomy was defined. A planned exit site from the hepatic vein and puncture site from the portal vein was placed into a single sonographic plane. Under direct ultrasound visualization, the ScorpionX needle was advanced into the central right portal vein. Hand injection of contrast confirmed position within the portal system. A Glidewire Advantage was then advanced into the splenic vein. A 5 French marking pigtail catheter was then advanced over the wire into the main portal vein  and wire removed. Portal venogram was performed which demonstrated a patent portal vein. A few very small varices were seen arising from the main portal vein. Portal manometry was then performed. The tract was then dilated to 8 mm with an 8 mm x 8 cm Athletis balloon. A 8-10 mm by 7 + 2 cm of Viatorr endograft was placed. No post deployment balloon molding was performed. After placement of the shunt, right atrial and portal pressures were repeated. Completion portal venogram demonstrates a patent TIPS endograft without significant gastroesophageal varices. The catheters and sheath were removed and manual compression was applied to the right internal jugular and right common femoral venous access sites until hemostasis was achieved. The patient was transferred to the PACU in stable condition. Pre-TIPS Mean Pressures (mmHg): Right atrium: 14 Portal vein: 35 Portosystemic gradient: 21 Post-TIPS Mean Pressures (mmHg): Right atrium:17 Portal vein: 26 Portosystemic gradient: 9 IMPRESSION: 1. Successful transjugular portosystemic shunt creation. 2. Portosystemic gradient of 21 mm Hg (absolute portal venous pressure 35 mm Hg) before shunt placement and 9 mm Hg (absolute portal venous pressure 26 mm Hg) after shunt placement. Marliss Coots, MD [facility] Radiology Electronically Signed   By: Marliss Coots M.D.   On: 07/29/2023 09:42   IR US Guide Vasc Access Right Result Date: 07/29/2023 CLINICAL DATA:  68 year old female with a history of decompensated cryptogenic cirrhosis (Child Pugh B9, MELD 13) with recurrent ascites. She is taking diuretics with maximization limited by renal function (CKD3a). Additional history significant for hematemesis treated with ~20 esophageal bands. No recent hematemesis, never encephalopathic. EXAM: 1. Ultrasound-guided access of the right internal jugular vein 2. Ultrasound-guided access of the right common  femoral vein 3. Hepatic venogram 4.  Intravascular ultrasound 5. Catheterization of the portal vein 6. Portal venous and central manometry 7. Portal venogram 8. Creation of a transhepatic portal vein to hepatic vein shunt MEDICATIONS: As antibiotic prophylaxis, Rocephin 1 gm IV was ordered pre-procedure and administered intravenously within one hour of incision. ANESTHESIA/SEDATION: General - as administered by the Anesthesia department CONTRAST:  Sixty ML Omnipaque 300, intravenous FLUOROSCOPY TIME:  Eighty mGy COMPLICATIONS: None immediate. PROCEDURE: Informed written consent was obtained from the patient after a thorough discussion of the procedural risks, benefits and alternatives. All questions were addressed. Maximal Sterile Barrier Technique was utilized including caps, mask, sterile gowns, sterile gloves, sterile drape, hand hygiene and skin antiseptic. A timeout was performed prior to the initiation of the procedure. A preliminary ultrasound of the right groin was performed and demonstrates a patent right common femoral vein. A permanent ultrasound image was recorded. Using a combination of fluoroscopy and ultrasound, an access site was determined. A small dermatotomy was made at the planned puncture site. Using ultrasound guidance, access into the right common femoral vein was obtained with visualization of needle entry into the vessel using a standard micropuncture technique. A wire was advanced into the IVC insert all fascial dilation performed. An 8 [place], 11 cm vascular sheath was placed into the external iliac vein. Through this access site, an 73 [place] ICE catheter was advanced with ease under fluoroscopic guidance to the level of the intrahepatic inferior vena cava. A preliminary ultrasound of the right neck was performed and demonstrates a patent internal jugular vein. A permanent ultrasound image was recorded. Using a combination of fluoroscopy and ultrasound, an access site was determined. A small dermatotomy was made at  the planned puncture site. Using ultrasound guidance, access into the right internal jugular vein was obtained with visualization of needle entry into the vessel using a standard micropuncture technique. A wire was advanced into the IVC and serial fascial dilation performed. A 10 French tips sheath was placed into the internal jugular vein and advanced to the IVC. The jugular sheath was retracted into the right atrium and manometry was performed. A 5 French angled tip catheter was then directed into the right hepatic vein. Hepatic venogram was performed. These images demonstrated patent hepatic vein with no stenosis. The catheter was advanced to a wedge portion of the a patent vein over which the 10 French sheath was advanced into the right hepatic vein. Using ICE ultrasound visualization the catheter within the right hepatic vein as well as the portal anatomy was defined. A planned exit site from the hepatic vein and puncture site from the portal vein was placed into a single sonographic plane. Under direct ultrasound visualization, the ScorpionX needle was advanced into the central right portal vein. Hand injection of contrast confirmed position within the portal system. A Glidewire Advantage was then advanced into the splenic vein. A 5 French marking pigtail catheter was then advanced over the wire into the main portal vein and wire removed. Portal venogram was performed which demonstrated a patent portal vein. A few very small varices were seen arising from the main portal vein. Portal manometry was then performed. The tract was then dilated to 8 mm with an 8 mm x 8 cm Athletis balloon. A 8-10 mm by 7 + 2 cm of Viatorr endograft was placed. No post deployment balloon molding was performed. After placement of the shunt, right atrial and portal pressures were repeated. Completion portal venogram demonstrates a patent TIPS  endograft without significant gastroesophageal varices. The catheters and sheath were removed  and manual compression was applied to the right internal jugular and right common femoral venous access sites until hemostasis was achieved. The patient was transferred to the PACU in stable condition. Pre-TIPS Mean Pressures (mmHg): Right atrium: 14 Portal vein: 35 Portosystemic gradient: 21 Post-TIPS Mean Pressures (mmHg): Right atrium:17 Portal vein: 26 Portosystemic gradient: 9 IMPRESSION: 1. Successful transjugular portosystemic shunt creation. 2. Portosystemic gradient of 21 mm Hg (absolute portal venous pressure 35 mm Hg) before shunt placement and 9 mm Hg (absolute portal venous pressure 26 mm Hg) after shunt placement. Marliss Coots, MD [facility] Radiology Electronically Signed   By: Marliss Coots M.D.   On: 07/29/2023 09:42   IR US Guide Vasc Access Right Result Date: 07/29/2023 CLINICAL DATA:  68 year old female with a history of decompensated cryptogenic cirrhosis (Child Pugh B9, MELD 13) with recurrent ascites. She is taking diuretics with maximization limited by renal function (CKD3a). Additional history significant for hematemesis treated with ~20 esophageal bands. No recent hematemesis, never encephalopathic. EXAM: 1. Ultrasound-guided access of the right internal jugular vein 2. Ultrasound-guided access of the right common femoral vein 3. Hepatic venogram 4. Intravascular ultrasound 5. Catheterization of the portal vein 6. Portal venous and central manometry 7. Portal venogram 8. Creation of a transhepatic portal vein to hepatic vein shunt MEDICATIONS: As antibiotic prophylaxis, Rocephin 1 gm IV was ordered pre-procedure and administered intravenously within one hour of incision. ANESTHESIA/SEDATION: General - as administered by the Anesthesia department CONTRAST:  Sixty ML Omnipaque 300, intravenous FLUOROSCOPY TIME:  Eighty mGy COMPLICATIONS: None immediate. PROCEDURE: Informed written consent was obtained from the patient after a thorough  discussion of the procedural risks, benefits and alternatives. All questions were addressed. Maximal Sterile Barrier Technique was utilized including caps, mask, sterile gowns, sterile gloves, sterile drape, hand hygiene and skin antiseptic. A timeout was performed prior to the initiation of the procedure. A preliminary ultrasound of the right groin was performed and demonstrates a patent right common femoral vein. A permanent ultrasound image was recorded. Using a combination of fluoroscopy and ultrasound, an access site was determined. A small dermatotomy was made at the planned puncture site. Using ultrasound guidance, access into the right common femoral vein was obtained with visualization of needle entry into the vessel using a standard micropuncture technique. A wire was advanced into the IVC insert all fascial dilation performed. An 8 [place], 11 cm vascular sheath was placed into the external iliac vein. Through this access site, an 6 [place] ICE catheter was advanced with ease under fluoroscopic guidance to the level of the intrahepatic inferior vena cava. A preliminary ultrasound of the right neck was performed and demonstrates a patent internal jugular vein. A permanent ultrasound image was recorded. Using a combination of fluoroscopy and ultrasound, an access site was determined. A small dermatotomy was made at the planned puncture site. Using ultrasound guidance, access into the right internal jugular vein was obtained with visualization of needle entry into the vessel using a standard micropuncture technique. A wire was advanced into the IVC and serial fascial dilation performed. A 10 French tips sheath was placed into the internal jugular vein and advanced to the IVC. The jugular sheath was retracted into the right atrium and manometry was performed. A 5 French angled tip catheter was then directed into the right hepatic vein. Hepatic venogram was performed. These images demonstrated patent  hepatic vein with no stenosis. The catheter  was advanced to a wedge portion of the a patent vein over which the 10 French sheath was advanced into the right hepatic vein. Using ICE ultrasound visualization the catheter within the right hepatic vein as well as the portal anatomy was defined. A planned exit site from the hepatic vein and puncture site from the portal vein was placed into a single sonographic plane. Under direct ultrasound visualization, the ScorpionX needle was advanced into the central right portal vein. Hand injection of contrast confirmed position within the portal system. A Glidewire Advantage was then advanced into the splenic vein. A 5 French marking pigtail catheter was then advanced over the wire into the main portal vein and wire removed. Portal venogram was performed which demonstrated a patent portal vein. A few very small varices were seen arising from the main portal vein. Portal manometry was then performed. The tract was then dilated to 8 mm with an 8 mm x 8 cm Athletis balloon. A 8-10 mm by 7 + 2 cm of Viatorr endograft was placed. No post deployment balloon molding was performed. After placement of the shunt, right atrial and portal pressures were repeated. Completion portal venogram demonstrates a patent TIPS endograft without significant gastroesophageal varices. The catheters and sheath were removed and manual compression was applied to the right internal jugular and right common femoral venous access sites until hemostasis was achieved. The patient was transferred to the PACU in stable condition. Pre-TIPS Mean Pressures (mmHg): Right atrium: 14 Portal vein: 35 Portosystemic gradient: 21 Post-TIPS Mean Pressures (mmHg): Right atrium:17 Portal vein: 26 Portosystemic gradient: 9 IMPRESSION: 1. Successful transjugular portosystemic shunt creation. 2. Portosystemic gradient of 21 mm Hg (absolute portal venous pressure 35 mm Hg) before shunt placement and 9 mm Hg (absolute portal venous  pressure 26 mm Hg) after shunt placement. Marliss Coots, MD [facility] Radiology Electronically Signed   By: Marliss Coots M.D.   On: 07/29/2023 09:42   IR Paracentesis Result Date: 07/26/2023 INDICATION: Patient with a history of cirrhosis with recurrent ascites. Interventional radiology asked to perform a therapeutic paracentesis. EXAM: ULTRASOUND GUIDED PARACENTESIS MEDICATIONS: 1% lidocaine 10 mL COMPLICATIONS: None immediate. PROCEDURE: Informed written consent was obtained from the patient after a discussion of the risks, benefits and alternatives to treatment. A timeout was performed prior to the initiation of the procedure. Initial ultrasound scanning demonstrates a large amount of ascites within the right lower abdominal quadrant. The right lower abdomen was prepped and draped in the usual sterile fashion. 1% lidocaine was used for local anesthesia. Following this, a 19 gauge, 7-cm, Yueh catheter was introduced. An ultrasound image was saved for documentation purposes. The paracentesis was performed. The catheter was removed and a dressing was applied. The patient tolerated the procedure well without immediate post procedural complication. FINDINGS: A total of approximately 5.4 L of clear yellow fluid was removed. IMPRESSION: Successful ultrasound-guided paracentesis yielding 5.4 liters of peritoneal fluid. Procedure performed by Alwyn Ren NP PLAN: Patient is scheduled for TIPS 07/28/2023 Electronically Signed   By: Marliss Coots M.D.   On: 07/26/2023 15:02   CT Angio Abd/Pel w/ and/or w/o Result Date: 07/15/2023 CLINICAL DATA:  68 year old female with a history of decompensated cryptogenic cirrhosis (Child Pugh B9, MELD 13) with recurrent ascites. She is taking diuretics with maximization limited by renal function (CKD3a). Additional history significant for hematemesis treated with ~20 esophageal bands. No recent hematemesis, never  encephalopathic. Pre-procedure CTA prior to TIPS creation. EXAM: CT ANGIOGRAPHY ABDOMEN AND PELVIS WITH  CONTRAST AND WITHOUT CONTRAST TECHNIQUE: Multidetector CT imaging of the abdomen and pelvis was performed using the standard protocol during bolus administration of intravenous contrast. Multiplanar reconstructed images and MIPs were obtained and reviewed to evaluate the vascular anatomy. RADIATION DOSE REDUCTION: This exam was performed according to the departmental dose-optimization program which includes automated exposure control, adjustment of the mA and/or kV according to patient size and/or use of iterative reconstruction technique. CONTRAST:  75mL OMNIPAQUE IOHEXOL 350 MG/ML SOLN COMPARISON:  09/18/2018, 09/27/2022 FINDINGS: VASCULAR Aorta: Normal caliber aorta without aneurysm, dissection, vasculitis or significant stenosis. Celiac: There is a saccular aneurysm arising from the proximal splenic artery just after its bifurcation which measures 2.5 x 2.3 x 2.0 cm (AP by trans by cc, stable since March 2024 comparison, however enlarged from 1.5 cm from 09/18/2018 comparison. Celiac trunk is normal in caliber and patent. SMA: Patent without evidence of aneurysm, dissection, vasculitis or significant stenosis. Renals: Single bilateral renal arteries are patent without evidence of aneurysm, dissection, vasculitis, fibromuscular dysplasia or significant stenosis. IMA: Patent without evidence of aneurysm, dissection, vasculitis or significant stenosis. Inflow: Patent without evidence of aneurysm, dissection, vasculitis or significant stenosis. Proximal Outflow: Bilateral common femoral and visualized portions of the superficial and profunda femoral arteries are patent without evidence of aneurysm, dissection, vasculitis or significant stenosis. Veins: The hepatic veins are widely patent. The portal system is widely patent and normal in caliber. The renal veins are patent bilaterally in standard anatomic  configuration. No evidence of iliocaval thrombosis or anomaly. Review of the MIP images confirms the above findings. NON-VASCULAR Lower chest: No acute abnormality. Hepatobiliary: Similar appearing shrunken liver with diffusely nodular contour. No evidence of hepatoma. The gallbladder is present with multiple layering calcified gallstones in the infundibulum. No intra or extrahepatic biliary ductal dilation. Pancreas: Similar appearing diffuse fatty atrophy. No focal mass or ductal dilation. Spleen: Similar appearing splenomegaly measuring up to 22 cm in maximum craniocaudal dimension. No focal masses. Adrenals/Urinary Tract: Adrenal glands are unremarkable. Kidneys are normal, without renal calculi, focal lesion, or hydronephrosis. Bladder is unremarkable. Stomach/Bowel: Stomach is within normal limits. Appendix appears normal. No evidence of bowel wall thickening, distention, or inflammatory changes. Lymphatic: No abdominopelvic lymphadenopathy. Reproductive: Status post hysterectomy. No adnexal masses. Other: Large volume ascites. Similar appearing infraumbilical hernia containing ascites. Musculoskeletal: No acute or significant osseous findings. IMPRESSION: VASCULAR 1. Patent portal venous system without evidence of the significant varices. 2. 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 NON-VASCULAR Similar morphologic changes of hepatic cirrhosis and secondary signs of portal hypertension including massive splenomegaly and large volume ascites. No hepatoma. Marliss Coots, MD [facility] Radiology Electronically Signed   By: Marliss Coots M.D.   On: 07/15/2023 14:45   IR Paracentesis Result Date: 07/14/2023 INDICATION: Patient with a history of cirrhosis with recurrent ascites. Interventional radiology asked to perform a therapeutic paracentesis. EXAM: ULTRASOUND GUIDED THERAPEUTIC PARACENTESIS MEDICATIONS: 1% lidocaine 10 mL COMPLICATIONS: None  immediate. PROCEDURE: Informed written consent was obtained from the patient after a discussion of the risks, benefits and alternatives to treatment. A timeout was performed prior to the initiation of the procedure. Initial ultrasound scanning demonstrates a large amount of ascites within the left lower abdominal quadrant. The left lower abdomen was prepped and draped in the usual sterile fashion. 1% lidocaine was used for local anesthesia. Following this, a 19 gauge, 7-cm, Yueh catheter was introduced. An ultrasound image was saved for documentation purposes. The paracentesis was performed. The catheter was removed  and a dressing was applied. The patient tolerated the procedure well without immediate post procedural complication. FINDINGS: A total of approximately 5.5 L of clear yellow fluid was removed. IMPRESSION: Successful ultrasound-guided paracentesis yielding 5.5 L of peritoneal fluid. Procedure performed by Alwyn Ren NP PLAN: The patient has previously been formally evaluated by the [facility] and is being actively followed for potential future intervention. Patient is scheduled for TIPS 07/28/2023. Roanna Banning, MD [facility] Radiology Electronically Signed   By: Roanna Banning M.D.   On: 07/14/2023 14:27   ECHOCARDIOGRAM COMPLETE Result Date: 07/13/2023    ECHOCARDIOGRAM REPORT   Patient Name:   EDILIA GHUMAN Date of Exam: 07/13/2023 Medical Rec #:  [id number]     Height:       63.0 in Accession #:    [id number]    Weight:       151.0 lb Date of Birth:  09-10-1955      BSA:          1.716 m Patient Age:    67 years      BP:           123/67 mmHg Patient Gender: F             HR:           66 bpm. Exam Location:  Outpatient Procedure: 2D Echo, Cardiac Doppler and Color Doppler Indications:    Preoperative evaluation  History:        Patient has prior history of Echocardiogram examinations, most                  recent 02/03/2019. Risk Factors:Hypertension and Diabetes.  Sonographer:    Mikki Harbor Referring Phys: [id number] GMWNU J SUTTLE IMPRESSIONS  1. Left ventricular ejection fraction, by estimation, is 60 to 65%. The left ventricle has normal function. The left ventricle has no regional wall motion abnormalities. There is mild concentric left ventricular hypertrophy. Left ventricular diastolic parameters are consistent with Grade I diastolic dysfunction (impaired relaxation).  2. Right ventricular systolic function is normal. The right ventricular size is normal. There is normal pulmonary artery systolic pressure.  3. Left atrial size was severely dilated.  4. Right atrial size was moderately dilated.  5. The mitral valve is normal in structure. Mild mitral valve regurgitation. No evidence of mitral stenosis.  6. Tricuspid valve regurgitation is mild to moderate.  7. The aortic valve is tricuspid. Aortic valve regurgitation is mild. Aortic valve sclerosis/calcification is present, without any evidence of aortic stenosis. FINDINGS  Left Ventricle: Left ventricular ejection fraction, by estimation, is 60 to 65%. The left ventricle has normal function. The left ventricle has no regional wall motion abnormalities. The left ventricular internal cavity size was normal in size. There is  mild concentric left ventricular hypertrophy. Left ventricular diastolic parameters are consistent with Grade I diastolic dysfunction (impaired relaxation). Right Ventricle: The right ventricular size is normal. No increase in right ventricular wall thickness. Right ventricular systolic function is normal. There is normal pulmonary artery systolic pressure. The tricuspid regurgitant velocity is 2.48 m/s, and  with an assumed right atrial pressure of 8 mmHg, the estimated right ventricular systolic pressure is 32.6 mmHg. Left Atrium: Left atrial size was severely dilated. Right Atrium: Right atrial size was moderately dilated.  Pericardium: There is no evidence of pericardial effusion. Mitral Valve: The mitral valve is normal in structure. Mild mitral valve regurgitation. No evidence of mitral valve  stenosis. MV peak gradient, 2.8 mmHg. The mean mitral valve gradient is 1.0 mmHg. Tricuspid Valve: The tricuspid valve is normal in structure. Tricuspid valve regurgitation is mild to moderate. No evidence of tricuspid stenosis. Aortic Valve: The aortic valve is tricuspid. Aortic valve regurgitation is mild. Aortic valve sclerosis/calcification is present, without any evidence of aortic stenosis. Aortic valve mean gradient measures 5.0 mmHg. Aortic valve peak gradient measures 9.9 mmHg. Aortic valve area, by VTI measures 1.79 cm. Pulmonic Valve: The pulmonic valve was normal in structure. Pulmonic valve regurgitation is mild. No evidence of pulmonic stenosis. Aorta: The aortic root is normal in size and structure. Venous: The inferior vena cava was not well visualized. IAS/Shunts: No atrial level shunt detected by color flow Doppler.  LEFT VENTRICLE PLAX 2D LVIDd:         4.00 cm     Diastology LVIDs:         2.30 cm     LV e' medial:    5.44 cm/s LV PW:         0.90 cm     LV E/e' medial:  13.4 LV IVS:        1.10 cm     LV e' lateral:   9.03 cm/s LVOT diam:     2.00 cm     LV E/e' lateral: 8.1 LV SV:         71 LV SV Index:   41 LVOT Area:     3.14 cm  LV Volumes (MOD) LV vol d, MOD A2C: 63.1 ml LV vol d, MOD A4C: 65.6 ml LV vol s, MOD A2C: 23.5 ml LV vol s, MOD A4C: 24.2 ml LV SV MOD A2C:     39.6 ml LV SV MOD A4C:     65.6 ml LV SV MOD BP:      40.3 ml RIGHT VENTRICLE RV Basal diam:  3.30 cm RV Mid diam:    3.50 cm RV S prime:     8.92 cm/s TAPSE (M-mode): 1.9 cm LEFT ATRIUM             Index        RIGHT ATRIUM           Index LA diam:        4.70 cm 2.74 cm/m   RA Area:     17.20 cm LA Vol (A2C):   67.4 ml 39.28 ml/m  RA Volume:   40.30 ml  23.49 ml/m LA Vol (A4C):   87.5 ml 50.99 ml/m LA Biplane Vol: 76.0 ml 44.29 ml/m  AORTIC  VALVE                    PULMONIC VALVE AV Area (Vmax):    1.78 cm     PV Vmax:       0.94 m/s AV Area (Vmean):   1.81 cm     PV Peak grad:  3.6 mmHg AV Area (VTI):     1.79 cm AV Vmax:           157.00 cm/s AV Vmean:          99.200 cm/s AV VTI:            0.397 m AV Peak Grad:      9.9 mmHg AV Mean Grad:      5.0 mmHg LVOT Vmax:         88.80 cm/s LVOT Vmean:        57.000 cm/s LVOT  VTI:          0.226 m LVOT/AV VTI ratio: 0.57  AORTA Ao Root diam: 3.20 cm MITRAL VALVE               TRICUSPID VALVE MV Area (PHT): 2.86 cm    TR Peak grad:   24.6 mmHg MV Area VTI:   3.07 cm    TR Vmax:        248.00 cm/s MV Peak grad:  2.8 mmHg MV Mean grad:  1.0 mmHg    SHUNTS MV Vmax:       0.83 m/s    Systemic VTI:  0.23 m MV Vmean:      43.8 cm/s   Systemic Diam: 2.00 cm MV Decel Time: 265 msec MV E velocity: 73.10 cm/s MV A velocity: 76.80 cm/s MV E/A ratio:  0.95 Arvilla Meres MD Electronically signed by Arvilla Meres MD Signature Date/Time: 07/13/2023/11:07:47 AM    Final     Treatments: IV hydration and procedures: TIPS  Discharge Exam: Blood pressure 126/70, pulse 74, temperature 98 F (36.7 C), resp. rate 18, height 5\' 3"  (1.6 m), weight 147 lb (66.7 kg), SpO2 98%. Physical Exam Vitals and nursing note reviewed.  Constitutional:      General: She is not in acute distress. HENT:     Head: Normocephalic.  Eyes:     General: No scleral icterus. Cardiovascular:     Rate and Rhythm: Normal rate and regular rhythm.  Pulmonary:     Effort: Pulmonary effort is normal.     Breath sounds: Normal breath sounds.  Abdominal:     General: There is no distension.     Palpations: Abdomen is soft.     Tenderness: There is no abdominal tenderness.  Skin:    General: Skin is warm and dry.     Coloration: Skin is not jaundiced.  Neurological:     Mental Status: She is alert and oriented to person, place, and time.  Psychiatric:        Mood and Affect: Mood normal.        Behavior: Behavior normal.         Thought Content: Thought content normal.        Judgment: Judgment normal.     Disposition: Discharge disposition: 01-Home or Self Care        Allergies as of 07/29/2023       Reactions   Hydrocodone-acetaminophen Nausea And Vomiting   Codeine Nausea And Vomiting   Lactose Intolerance (gi)    unknown        Medication List     TAKE these medications    CIPROFLOXACIN HCL OT Place 1 drop in ear(s) 4 (four) times daily. 3%   furosemide 40 MG tablet Commonly known as: LASIX Take 1 tablet (40 mg total) by mouth daily.   lactulose 10 GM/15ML solution Commonly known as: CHRONULAC Take 30 mLs (20 g total) by mouth 3 (three) times daily.   leptospermum manuka honey Pste paste Apply 1 Application topically daily.   Meclizine HCl 25 MG Chew Chew 25 mg by mouth 4 (four) times daily.   ondansetron 8 MG tablet Commonly known as: ZOFRAN Take 12 mg by mouth every 8 (eight) hours as needed for nausea or vomiting.   pantoprazole 40 MG tablet Commonly known as: PROTONIX Take 1 tablet (40 mg total) by mouth daily.   polyethylene glycol powder 17 GM/SCOOP powder Commonly known as: GLYCOLAX/MIRALAX Take 17 g by mouth daily as  needed for mild constipation.   spironolactone 50 MG tablet Commonly known as: ALDACTONE Take 1 tablet (50 mg total) by mouth daily.   traMADol 50 MG tablet Commonly known as: ULTRAM Take 1 tablet (50 mg total) by mouth every 12 (twelve) hours as needed for moderate pain or severe pain.        Follow-up Information     [facility] Follow up.   Specialty: Radiology Why: IR schedulers will call to schedule 1 month follow up appointment with Dr. Elby Showers. Please call [phone] with questons or concerns prior to your appointment. Contact information: [address] [place] [address] [phone]                 Electronically Signed: Villa Herb, PA-C 07/29/2023, 12:00 PM   I have  spent Greater Than 30 Minutes discharging Mozetta Hettie Holstein.

## 2023-08-02 ENCOUNTER — Telehealth (HOSPITAL_COMMUNITY): Payer: Self-pay | Admitting: Student

## 2023-08-02 NOTE — Telephone Encounter (Signed)
   [facility] TIPS post-procedure phone call follow-up   [name] is a 68 y.o. female who underwent TIPS creation at [facility] 07/28/23 by Dr. Elby Showers   Patient is 5 days from procedure.   # of paracentesis in last month: 2 # of paracentesis in last 2 months: 4  Current diuretic regimen: Lasix 40 mg daily; spironolactone 50 mg daily Current pharmacologic encephalopathy prophylaxis/treatment: Lactulose 20 g TID  Post-TIPS Imaging: none   Labs: 07/28/23 Creatinine: 1.18 Total Bilirubin: 1.4 INR: 1.1 Sodium: 138 Albumin: 3.2 Ammonia: n/a  Assessment: [name] is a 68 y.o. female with history of Cirrhosis. Patient is 5 days from TIPS creation and is feeling well with the exception of ongoing vertigo which she has had since pre-procedure. She has some nausea with lightheadedness and mild shortness of breath. She denies abdominal distention or leg edema. Her appetite is ok. She has enough energy to perform her activities of daily living. She confessed she has not been taking her lactulose as directed and has only taken one dose since hospital discharge. She has had several bowel movements in the past few days. We discussed the necessity of this drug in this early post-procedure period. We discussed signs/symptoms of hepatic encephalopathy. Her husband participated in the phone call (in the background) and he is aware of the signs/symptoms of HE. [name] has no needs at this time and she has the number to the IR APP office at [facility]. She has been encouraged to call me if she has any questions/concerns prior to our next phone call.   Recommendation:  Continue current treatment plan with next Clinic follow up scheduled for 08/09/23.   Electronically Signed: Mickie Kay, NP 08/02/2023, 4:52 PM

## 2023-08-09 ENCOUNTER — Other Ambulatory Visit: Payer: Self-pay

## 2023-08-09 ENCOUNTER — Emergency Department (HOSPITAL_COMMUNITY): Payer: No Typology Code available for payment source

## 2023-08-09 ENCOUNTER — Encounter (HOSPITAL_COMMUNITY): Payer: Self-pay

## 2023-08-09 ENCOUNTER — Encounter: Payer: Self-pay | Admitting: Internal Medicine

## 2023-08-09 ENCOUNTER — Emergency Department (HOSPITAL_COMMUNITY)
Admission: EM | Admit: 2023-08-09 | Discharge: 2023-08-10 | Discharge status: Against Medical Advice | Payer: No Typology Code available for payment source | Attending: Emergency Medicine | Admitting: Emergency Medicine

## 2023-08-09 DIAGNOSIS — R410 Disorientation, unspecified: Secondary | ICD-10-CM | POA: Insufficient documentation

## 2023-08-09 DIAGNOSIS — R5383 Other fatigue: Secondary | ICD-10-CM | POA: Insufficient documentation

## 2023-08-09 DIAGNOSIS — Z5321 Procedure and treatment not carried out due to patient leaving prior to being seen by health care provider: Secondary | ICD-10-CM | POA: Diagnosis not present

## 2023-08-09 LAB — CBC WITH DIFFERENTIAL/PLATELET
Abs Immature Granulocytes: 0.01 10*3/uL (ref 0.00–0.07)
Basophils Absolute: 0 10*3/uL (ref 0.0–0.1)
Basophils Relative: 0 %
Eosinophils Absolute: 0.1 10*3/uL (ref 0.0–0.5)
Eosinophils Relative: 1 %
HCT: 36.8 % (ref 36.0–46.0)
Hemoglobin: 12.6 g/dL (ref 12.0–15.0)
Immature Granulocytes: 0 %
Lymphocytes Relative: 16 %
Lymphs Abs: 0.9 10*3/uL (ref 0.7–4.0)
MCH: 27.2 pg (ref 26.0–34.0)
MCHC: 34.2 g/dL (ref 30.0–36.0)
MCV: 79.5 fL — ABNORMAL LOW (ref 80.0–100.0)
Monocytes Absolute: 0.3 10*3/uL (ref 0.1–1.0)
Monocytes Relative: 5 %
Neutro Abs: 4.6 10*3/uL (ref 1.7–7.7)
Neutrophils Relative %: 78 %
Platelets: 105 10*3/uL — ABNORMAL LOW (ref 150–400)
RBC: 4.63 MIL/uL (ref 3.87–5.11)
RDW: 17.3 % — ABNORMAL HIGH (ref 11.5–15.5)
WBC: 5.9 10*3/uL (ref 4.0–10.5)
nRBC: 0 % (ref 0.0–0.2)

## 2023-08-09 LAB — COMPREHENSIVE METABOLIC PANEL
ALT: 45 U/L — ABNORMAL HIGH (ref 0–44)
AST: 40 U/L (ref 15–41)
Albumin: 3.3 g/dL — ABNORMAL LOW (ref 3.5–5.0)
Alkaline Phosphatase: 101 U/L (ref 38–126)
Anion gap: 9 (ref 5–15)
BUN: 10 mg/dL (ref 8–23)
CO2: 20 mmol/L — ABNORMAL LOW (ref 22–32)
Calcium: 8.9 mg/dL (ref 8.9–10.3)
Chloride: 112 mmol/L — ABNORMAL HIGH (ref 98–111)
Creatinine, Ser: 0.83 mg/dL (ref 0.44–1.00)
GFR, Estimated: >60 mL/min (ref >60)
Glucose, Bld: 233 mg/dL — ABNORMAL HIGH (ref 70–99)
Potassium: 3.4 mmol/L — ABNORMAL LOW (ref 3.5–5.1)
Sodium: 141 mmol/L (ref 135–145)
Total Bilirubin: 3.4 mg/dL — ABNORMAL HIGH (ref 0.0–1.2)
Total Protein: 6.9 g/dL (ref 6.5–8.1)

## 2023-08-09 NOTE — ED Provider Triage Note (Signed)
 Emergency Medicine Provider Triage Evaluation Note  [name] , a 68 y.o. female  was evaluated in triage.  Pt complains of sleeping in longer and having more difficulty getting up over the past couple days.  States she recently had a TIPS procedure, no immediate complications from that.  Denies any fever or chills at home.  Denies pain anywhere.  Denies chest pain or shortness of breath.  Review of Systems  Positive: As above Negative: As above  Physical Exam  BP (!) 148/79   Pulse 89   Temp 97.6 F (36.4 C)   Resp 17   Ht 5' 3 (1.6 m)   Wt 67.6 kg   SpO2 100%   BMI 26.39 kg/m  Gen:   Awake, no distress   Resp:  Normal effort  MSK:   Moves extremities without difficulty    Medical Decision Making  Medically screening exam initiated at 4:29 PM.  Appropriate orders placed.  [name] was informed that the remainder of the evaluation will be completed by another provider, this initial triage assessment does not replace that evaluation, and the importance of remaining in the ED until their evaluation is complete.     Veta Palma, PA-C 08/09/23 1631

## 2023-08-09 NOTE — ED Triage Notes (Signed)
Pt here for fatigued and stated she could not wake up this morning and it has been going on for 4 days. When pt wakes up she is confused. Denies headaches. Axox4. Pt recently had TIPS procedure.

## 2023-08-10 ENCOUNTER — Telehealth (HOSPITAL_COMMUNITY): Payer: Self-pay | Admitting: Student

## 2023-08-10 NOTE — ED Notes (Signed)
Pt called for roll call with no answer

## 2023-08-10 NOTE — Telephone Encounter (Signed)
 Patient contacted for post-TIPS follow up. I spoke with her husband, Charlie, and he said she is having a significant amount of fatigue with some mild alterations in her mental status. They went to the ED last night and after waiting 12 hours they left without being seen. Labs drawn in the ED showed an elevated bilirubin (3.4) but otherwise stable findings. The patient has an appointment to be seen by her PCP at the [facility]. I will plan to follow up in a few days.   Seng Fouts, AGACNP-BC 08/10/2023, 10:45 AM .

## 2023-08-31 ENCOUNTER — Other Ambulatory Visit: Payer: Self-pay | Admitting: Interventional Radiology

## 2023-08-31 DIAGNOSIS — K7460 Unspecified cirrhosis of liver: Secondary | ICD-10-CM

## 2023-08-31 DIAGNOSIS — Z95828 Presence of other vascular implants and grafts: Secondary | ICD-10-CM

## 2023-09-09 ENCOUNTER — Encounter: Payer: Self-pay | Admitting: Internal Medicine

## 2023-09-12 ENCOUNTER — Encounter: Payer: Self-pay | Admitting: Internal Medicine

## 2023-09-19 ENCOUNTER — Ambulatory Visit
Admission: RE | Admit: 2023-09-19 | Discharge: 2023-09-19 | Disposition: A | Discharge status: Home / Self Care | Source: Ambulatory Visit | Attending: Interventional Radiology | Admitting: Interventional Radiology

## 2023-09-19 DIAGNOSIS — K7460 Unspecified cirrhosis of liver: Secondary | ICD-10-CM

## 2023-09-19 DIAGNOSIS — Z95828 Presence of other vascular implants and grafts: Secondary | ICD-10-CM

## 2023-09-25 NOTE — Progress Notes (Shared)
 Referring Physician(s): Lynann Bologna   Chief Complaint: The patient is seen in virtual video follow up today s/p TIPS 07/28/23  History of present illness: HPI from initial consultation 06/24/23 [name] is a 68 y.o. female with a medical history significant for HTN, iron deficiency anemia, ventral hernia and irritable bowel syndrome. She also has a history of decompensated cryptogenic cirrhosis with portal hypertension, esophageal varices s/p banding and recurrent large volume ascites. She has been hospitalized several times this year with abdominal distention, GI bleed and renal insufficiency. An EGD 09/27/22 showed Grade I varices with portal hypertensive gastropathy. She has been seen in IR for approximately 9 paracenteses this year with an average removal of 4-5 L.    The patient has been kindly referred to Interventional Radiology by Dr. Lorenso Quarry to discuss TIPS creation for management of her portal hypertension. She is due for another paracentesis today, heading to [facility] after this visit.  She is currently taking 40 mg furosemide, 50 mg spironolactone daily, unable to escalate due to her renal function which had gradually worsened over the years.  She reports an episode of severe hematemesis in 2020 which prompted EGDs and interventions.  She has had no hematemesis since then.  She denies every having encephalopathy.  She was recently diagnosed with vertigo. Since her first vertigo episode, she has been periodically nauseated.  She is hopeful she can stop having so many paracenteses.  We discussed the risks, benefits, alternatives and procedure expectations associated with TIPS creation and she was agreeable to proceed. She underwent additional pre-procedural work up including an echocardiogram and CTA abdomen/pelvis. These studies confirmed her candidacy for TIPS and the procedure was performed 07/28/23. She tolerated the procedure well and was discharged home the next day.    She was evaluated at the [facility] ED 08/09/23 for fatigue and confusion. A CT of the head was negative and she left the ED before she could be formally evaluated. She presented to the ED at [facility] 08/10/23 with complaints of weakness, dizziness and worsening vertigo. Her work up was negative for acute findings and she was advised to follow up with neurology outpatient. On 08/18/23 she admitted to the [facility] hospital in [place] for altered mental status. Her ammonia levels were slightly elevated and she was admitted for further work up. She was discharged 08/25/23.  She presents today for follow up via virtual video visit. A TIPS ultrasound 09/19/23 showed a patent TIPS without evidence of stenosis. She continues to struggle with mild to moderate encephalopathy.  This is not every day, but at least every other.  She has remained compliant with lactulose, taking it 2-3 times per day with 2-3 bowel movements per day.  She has never taken rifaximin.  No blood in her stool, no hematemesis.  No jaundice.    Past Medical History:  Diagnosis Date   Arthritis    hands, and back   Hypertension    Iron deficiency anemia due to chronic blood loss 03/02/2019   Irritable bowel    Liver cirrhosis (HCC)    Sleep apnea    mild, pt unable to tolerate CPAP    Past Surgical History:  Procedure Laterality Date   BIOPSY  09/27/2022   Procedure: BIOPSY;  Surgeon: Lynann Bologna, DO;  Location: [facility] ENDOSCOPY;  Service: Gastroenterology;;   ESOPHAGOGASTRODUODENOSCOPY N/A 02/17/2019   Procedure: ESOPHAGOGASTRODUODENOSCOPY (EGD);  Surgeon: Toney Reil, MD;  Location: [facility] ENDOSCOPY;  Service: Gastroenterology;  Laterality: N/A;  ESOPHAGOGASTRODUODENOSCOPY (EGD) WITH PROPOFOL N/A 05/25/2019   Procedure: ESOPHAGOGASTRODUODENOSCOPY (EGD) WITH PROPOFOL;  Surgeon: Toney Reil, MD;  Location: [facility] ENDOSCOPY;  Service: Gastroenterology;  Laterality: N/A;   ESOPHAGOGASTRODUODENOSCOPY (EGD) WITH  PROPOFOL N/A 09/27/2022   Procedure: ESOPHAGOGASTRODUODENOSCOPY (EGD) WITH PROPOFOL;  Surgeon: Lynann Bologna, DO;  Location: [facility] ENDOSCOPY;  Service: Gastroenterology;  Laterality: N/A;   IR PARACENTESIS  09/24/2022   IR PARACENTESIS  09/27/2022   IR PARACENTESIS  01/18/2023   IR PARACENTESIS  04/07/2023   IR PARACENTESIS  05/06/2023   IR PARACENTESIS  05/20/2023   IR PARACENTESIS  06/10/2023   IR PARACENTESIS  06/24/2023   IR PARACENTESIS  07/14/2023   IR PARACENTESIS  07/26/2023   IR RADIOLOGIST EVAL & MGMT  06/24/2023   IR TIPS  07/28/2023   IR US GUIDE VASC ACCESS RIGHT  07/28/2023   IR US GUIDE VASC ACCESS RIGHT  07/28/2023   PARTIAL HYSTERECTOMY     SHOULDER ARTHROTOMY     TIPS PROCEDURE N/A 07/28/2023   Procedure: TRANS-JUGULAR INTRAHEPATIC PORTAL SHUNT (TIPS);  Surgeon: Bennie Dallas, MD;  Location: [facility] OR;  Service: Radiology;  Laterality: N/A;   TOTAL KNEE ARTHROPLASTY Bilateral     Allergies: Hydrocodone-acetaminophen, Codeine, and Lactose intolerance (gi)  Medications: Prior to Admission medications   Medication Sig Start Date End Date Taking? Authorizing Provider  CIPROFLOXACIN HCL OT Place 1 drop in ear(s) 4 (four) times daily. 3%    [provider]  furosemide (LASIX) 40 MG tablet Take 1 tablet (40 mg total) by mouth daily. 09/29/22   Marguerita Merles Latif, DO  lactulose (CHRONULAC) 10 GM/15ML solution Take 30 mLs (20 g total) by mouth 3 (three) times daily. 07/29/23   Lynnette Caffey A, PA-C  leptospermum manuka honey (MEDIHONEY) PSTE paste Apply 1 Application topically daily. Patient not taking: Reported on 07/21/2023 09/29/22   Marguerita Merles Latif, DO  Meclizine HCl 25 MG CHEW Chew 25 mg by mouth 4 (four) times daily. 06/10/23   [provider]  ondansetron (ZOFRAN) 8 MG tablet Take 12 mg by mouth every 8 (eight) hours as needed for nausea or vomiting. 12/13/22 11/03/23  [provider]  pantoprazole (PROTONIX) 40 MG tablet Take 1 tablet (40  mg total) by mouth daily. Patient not taking: Reported on 07/21/2023 09/28/22   Marguerita Merles Latif, DO  polyethylene glycol powder [facility]) 17 GM/SCOOP powder Take 17 g by mouth daily as needed for mild constipation. 09/28/22   Marguerita Merles Latif, DO  spironolactone (ALDACTONE) 50 MG tablet Take 1 tablet (50 mg total) by mouth daily. Patient not taking: Reported on 07/21/2023 09/29/22   Marguerita Merles Latif, DO  traMADol (ULTRAM) 50 MG tablet Take 1 tablet (50 mg total) by mouth every 12 (twelve) hours as needed for moderate pain or severe pain. Patient not taking: Reported on 07/21/2023 09/28/22   Merlene Laughter, DO     Family History  Problem Relation Age of Onset   Osteoarthritis Mother    Cancer Father        cirrhosis; alcoholic   Cirrhosis Sister        alcoholic   Cirrhosis Brother        alcoholic    Social History   Socioeconomic History   Marital status: Married    Spouse name: Not on file   Number of children: Not on file   Years of education: Not on file   Highest education level: Not on file  Occupational History   Not  on file  Tobacco Use   Smoking status: Never   Smokeless tobacco: Never  Vaping Use   Vaping status: Never Used  Substance and Sexual Activity   Alcohol use: No   Drug use: No   Sexual activity: Not on file  Other Topics Concern   Not on file  Social History Narrative   Lives in [place] with husband; never smoked; no current alcohol; prior ocassional alcohol. [facility] []   Social Drivers of Health   Financial Resource Strain: Low Risk  (04/27/2022)   [facility] to [facility]04[facility]22[facility] visits prior to 09/04/2022[facility]   Overall Financial Resource Strain (CARDIA)    Difficulty of Paying Living Expenses: Not very hard  Food Insecurity: No Food Insecurity (07/28/2023)   Hunger Vital Sign    Worried About Running Out of Food in the  Last Year: Never true    Ran Out of Food in the Last Year: Never true  Transportation Needs: No Transportation Needs (07/28/2023)   PRAPARE - Administrator, Civil Service (Medical): No    Lack of Transportation (Non-Medical): No  Physical Activity: Sufficiently Active (04/27/2022)   [facility] to 09/04/2022[facility], [facility] visits prior to 09/04/2022., [facility]   Exercise Vital Sign    Days of Exercise per Week: 5 days    Minutes of Exercise per Session: 60 min  Stress: Stress Concern Present (04/27/2022)   [facility] visits prior to 09/04/2022., [facility], [facility] visits prior to 09/04/2022., [facility]   [employer] of Occupational Health - Occupational Stress Questionnaire    Feeling of Stress : To some extent  Social Connections: Moderately Isolated (07/28/2023)   Social Connection and Isolation Panel [NHANES]    Frequency of Communication with Friends and Family: More than three times a week    Frequency of Social Gatherings with Friends and Family: Three times a week    Attends Religious Services: Never    Active Member of Clubs or Organizations: No    Attends Banker Meetings: Never    Marital Status: Married     Vital Signs: There were no vitals taken for this visit.  No physical exam was performed in lieu of virtual video visit.    Imaging: CT AP 08/30/22 08/30/22  Patent, dilated main portal and splenic veins.  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  No significant varices or portosystemic shunts.  Massive splenomegaly.  Ascites.  TIPS [place] 09/19/23 IMPRESSION: Patent TIPS without evidence of stenosis.      EGD 09/27/22 (Dr. Lorenso Quarry) - grade I varices, portal hypertensive gastropathy   EGD 05/25/23 (Dr. Allegra Lai)     EGD 02/17/19 (Dr. Allegra Lai)    Echocardiogram:  02/19/19 IMPRESSIONS    1. The left ventricle has normal systolic function, with an ejection  fraction of 55-60%. The cavity size was normal. Left ventricular diastolic  Doppler parameters are consistent with impaired relaxation.   2. The right ventricle has normal systolic function. The cavity was  normal. There is no increase in right ventricular wall thickness. Right  ventricular systolic pressure is normal with an estimated pressure of 30.2  mmHg.   3. Left atrial size was mild-moderately dilated.   4. The mitral valve is grossly normal.   5. The aortic valve is  grossly normal. No stenosis of the aortic valve.   6. The aorta is normal unless otherwise noted  Labs:  CBC: Recent Labs    05/29/23 1623 07/28/23 0755 07/29/23 0717 08/09/23 1646  WBC 3.6* 2.9* 9.4 5.9  HGB 11.7* 14.1 13.6 12.6  HCT 33.9* 42.1 38.5 36.8  PLT 65* 79* 89* 105*    COAGS: Recent Labs    09/27/22 0259 07/28/23 0755 07/29/23 0717  INR 1.3* 1.1 1.3*    BMP: Recent Labs    05/29/23 1623 07/14/23 1031 07/28/23 0755 07/29/23 0717 08/09/23 1646  NA 137  --  138 135 141  K 3.7  --  3.9 4.3 3.4*  CL 107  --  108 104 112*  CO2 20*  --  21* 20* 20*  GLUCOSE 140*  --  110* 170* 233*  BUN 20  --  22 15 10   CALCIUM 8.6*  --  8.9 8.6* 8.9  CREATININE 1.10* 1.00 1.18* 1.02* 0.83  GFRNONAA 54*  --  50* 60* >60    LIVER FUNCTION TESTS: Recent Labs    05/29/23 1623 07/28/23 0755 07/29/23 0717 08/09/23 1646  BILITOT 2.3* 1.4* 2.0* 3.4*  AST 35 38 264* 40  ALT 16 16 130* 45*  ALKPHOS 66 70 64 101  PROT 6.4* 7.0 6.3* 6.9  ALBUMIN 3.2* 3.2* 2.9* 3.3*    Assessment and Plan: 68 year old female with a history of decompensated cryptogenic cirrhosis (Child Pugh B9, MELD 13) with recurrent ascites and chronic kidney disease (CKD3a) in addition to remote history of hematemesis treated with ~20 esophageal bands. She underwent a technically successful TIPS creation 07/28/23.   She has experienced hepatic encephalopathy  since then despite compliance with lactulose.  -will add rifaximin  -plan for TIPS check and possible revision (constrainment) at [facility] with moderate sedation  Marliss Coots, MD Pager: [phone] Clinic: [phone]    I spent a total of 25 Minutes in virtual video clinical consultation, greater than 50% of which was counseling/coordinating care for portal hypertension.

## 2023-09-26 ENCOUNTER — Ambulatory Visit
Admission: RE | Admit: 2023-09-26 | Discharge: 2023-09-26 | Disposition: A | Discharge status: Home / Self Care | Source: Ambulatory Visit | Attending: Physician Assistant | Admitting: Physician Assistant

## 2023-09-26 DIAGNOSIS — Z95828 Presence of other vascular implants and grafts: Secondary | ICD-10-CM

## 2023-09-26 DIAGNOSIS — K7460 Unspecified cirrhosis of liver: Secondary | ICD-10-CM

## 2023-09-26 HISTORY — PX: IR RADIOLOGIST EVAL & MGMT: IMG5224

## 2023-09-26 MED ORDER — RIFAXIMIN 550 MG PO TABS
550.0000 mg | ORAL_TABLET | Freq: Two times a day (BID) | ORAL | 3 refills | Status: AC
Start: 2023-09-26 — End: 2024-01-24

## 2023-09-29 ENCOUNTER — Other Ambulatory Visit (HOSPITAL_COMMUNITY): Payer: Self-pay | Admitting: Interventional Radiology

## 2023-09-29 DIAGNOSIS — Z95828 Presence of other vascular implants and grafts: Secondary | ICD-10-CM

## 2023-09-29 DIAGNOSIS — K7460 Unspecified cirrhosis of liver: Secondary | ICD-10-CM

## 2023-09-29 DIAGNOSIS — K7290 Hepatic failure, unspecified without coma: Secondary | ICD-10-CM

## 2023-10-03 ENCOUNTER — Other Ambulatory Visit (HOSPITAL_COMMUNITY): Payer: Self-pay | Admitting: Internal Medicine

## 2023-10-03 DIAGNOSIS — K7460 Unspecified cirrhosis of liver: Secondary | ICD-10-CM

## 2023-10-07 ENCOUNTER — Ambulatory Visit (HOSPITAL_COMMUNITY)
Admission: RE | Admit: 2023-10-07 | Discharge: 2023-10-07 | Disposition: A | Discharge status: Home / Self Care | Source: Ambulatory Visit | Attending: Internal Medicine | Admitting: Internal Medicine

## 2023-10-07 DIAGNOSIS — R161 Splenomegaly, not elsewhere classified: Secondary | ICD-10-CM | POA: Diagnosis not present

## 2023-10-07 DIAGNOSIS — K8020 Calculus of gallbladder without cholecystitis without obstruction: Secondary | ICD-10-CM | POA: Diagnosis not present

## 2023-10-07 DIAGNOSIS — K7460 Unspecified cirrhosis of liver: Secondary | ICD-10-CM | POA: Diagnosis not present

## 2023-10-07 DIAGNOSIS — R188 Other ascites: Secondary | ICD-10-CM | POA: Diagnosis not present

## 2023-10-17 DIAGNOSIS — R42 Dizziness and giddiness: Secondary | ICD-10-CM | POA: Diagnosis not present

## 2023-10-17 DIAGNOSIS — K7460 Unspecified cirrhosis of liver: Secondary | ICD-10-CM | POA: Diagnosis not present

## 2023-10-17 DIAGNOSIS — Z95828 Presence of other vascular implants and grafts: Secondary | ICD-10-CM | POA: Diagnosis not present

## 2023-10-18 DIAGNOSIS — N3000 Acute cystitis without hematuria: Secondary | ICD-10-CM | POA: Diagnosis not present

## 2023-10-18 DIAGNOSIS — I10 Essential (primary) hypertension: Secondary | ICD-10-CM | POA: Diagnosis not present

## 2023-10-18 DIAGNOSIS — E1165 Type 2 diabetes mellitus with hyperglycemia: Secondary | ICD-10-CM | POA: Diagnosis not present

## 2023-10-18 DIAGNOSIS — E119 Type 2 diabetes mellitus without complications: Secondary | ICD-10-CM | POA: Diagnosis not present

## 2023-10-18 DIAGNOSIS — R001 Bradycardia, unspecified: Secondary | ICD-10-CM | POA: Diagnosis not present

## 2023-10-18 DIAGNOSIS — R9431 Abnormal electrocardiogram [ECG] [EKG]: Secondary | ICD-10-CM | POA: Diagnosis not present

## 2023-10-18 DIAGNOSIS — E785 Hyperlipidemia, unspecified: Secondary | ICD-10-CM | POA: Diagnosis not present

## 2023-10-26 ENCOUNTER — Other Ambulatory Visit: Payer: Self-pay | Admitting: Radiology

## 2023-10-26 ENCOUNTER — Other Ambulatory Visit: Payer: Self-pay | Admitting: Student

## 2023-10-26 DIAGNOSIS — K7460 Unspecified cirrhosis of liver: Secondary | ICD-10-CM

## 2023-10-26 MED ORDER — SODIUM CHLORIDE 0.9 % IV SOLN: INTRAVENOUS | Status: AC

## 2023-10-26 MED ORDER — CEFTRIAXONE SODIUM 2 G IJ SOLR: 2.0000 g | INTRAMUSCULAR | Status: AC

## 2023-10-26 NOTE — H&P (Signed)
 Chief Complaint: Patient was seen in consultation today for cirrhosis, recurrent ascites  Referring Physician(s): Dr. Gerlean Kocher  Supervising Physician: Creasie Doctor  Patient Status: [name] - Out-pt  History of Present Illness: [name] is a 68 y.o. female with history of HTN, IA, ventral hernia, IBS also with history of decompensated cryptogenic cirrhosis and recurrent large volume ascites s/p multiple paracenteses who underwent TIPS creation 07/28/23.  Since this time, patient has had episodic altered mentation despite consistent lactulose  use. After recent follow-up with Dr. Jinx Mourning she started rifaximin  and was recommended for TIPS interrogation vs repair.   Patient presents today for possible TIPS revision.  She reports since starting rifaximin  ***.  She is accompanied by ***.  She has been NPO.   She is full code. ***  Past Medical History:  Diagnosis Date   Arthritis    hands, and back   Hypertension    Iron  deficiency anemia due to chronic blood loss 03/02/2019   Irritable bowel    Liver cirrhosis (HCC)    Sleep apnea    mild, pt unable to tolerate CPAP    Past Surgical History:  Procedure Laterality Date   BIOPSY  09/27/2022   Procedure: BIOPSY;  Surgeon: Renaye Carp, DO;  Location: [facility] ENDOSCOPY;  Service: Gastroenterology;;   ESOPHAGOGASTRODUODENOSCOPY N/A 02/17/2019   Procedure: ESOPHAGOGASTRODUODENOSCOPY (EGD);  Surgeon: Selena Daily, MD;  Location: [facility] ENDOSCOPY;  Service: Gastroenterology;  Laterality: N/A;   ESOPHAGOGASTRODUODENOSCOPY (EGD) WITH PROPOFOL  N/A 05/25/2019   Procedure: ESOPHAGOGASTRODUODENOSCOPY (EGD) WITH PROPOFOL ;  Surgeon: Selena Daily, MD;  Location: [facility] ENDOSCOPY;  Service: Gastroenterology;  Laterality: N/A;   ESOPHAGOGASTRODUODENOSCOPY (EGD) WITH PROPOFOL  N/A 09/27/2022   Procedure: ESOPHAGOGASTRODUODENOSCOPY (EGD) WITH PROPOFOL ;  Surgeon: Renaye Carp, DO;  Location: [facility] ENDOSCOPY;  Service:  Gastroenterology;  Laterality: N/A;   IR PARACENTESIS  09/24/2022   IR PARACENTESIS  09/27/2022   IR PARACENTESIS  01/18/2023   IR PARACENTESIS  04/07/2023   IR PARACENTESIS  05/06/2023   IR PARACENTESIS  05/20/2023   IR PARACENTESIS  06/10/2023   IR PARACENTESIS  06/24/2023   IR PARACENTESIS  07/14/2023   IR PARACENTESIS  07/26/2023   IR RADIOLOGIST EVAL & MGMT  06/24/2023   IR RADIOLOGIST EVAL & MGMT  09/26/2023   IR TIPS  07/28/2023   IR US  GUIDE VASC ACCESS RIGHT  07/28/2023   IR US  GUIDE VASC ACCESS RIGHT  07/28/2023   PARTIAL HYSTERECTOMY     SHOULDER ARTHROTOMY     TIPS PROCEDURE N/A 07/28/2023   Procedure: TRANS-JUGULAR INTRAHEPATIC PORTAL SHUNT (TIPS);  Surgeon: Federico Hopkins, MD;  Location: [facility] OR;  Service: Radiology;  Laterality: N/A;   TOTAL KNEE ARTHROPLASTY Bilateral     Allergies: Hydrocodone -acetaminophen , Codeine , and Lactose intolerance (gi)  Medications: Prior to Admission medications   Medication Sig Start Date End Date Taking? Authorizing Provider  CIPROFLOXACIN  HCL OT Place 1 drop in ear(s) 4 (four) times daily. 3%    [provider]  furosemide  (LASIX ) 40 MG tablet Take 1 tablet (40 mg total) by mouth daily. 09/29/22   Aura Leeds Latif, DO  lactulose  (CHRONULAC ) 10 GM/15ML solution Take 30 mLs (20 g total) by mouth 3 (three) times daily. 07/29/23   Nathan Bake A, PA-C  leptospermum manuka honey (MEDIHONEY) PSTE paste Apply 1 Application topically daily. Patient not taking: Reported on 07/21/2023 09/29/22   Sheikh, Omair Latif, DO  Meclizine  HCl 25 MG CHEW Chew 25 mg by mouth 4 (four) times daily. 06/10/23  [provider]  ondansetron  (ZOFRAN ) 8 MG tablet Take 12 mg by mouth every 8 (eight) hours as needed for nausea or vomiting. 12/13/22 11/03/23  [provider]  pantoprazole  (PROTONIX ) 40 MG tablet Take 1 tablet (40 mg total) by mouth daily. Patient not taking: Reported on 07/21/2023 09/28/22   Sheikh, Omair Latif, DO   polyethylene glycol powder (GLYCOLAX /MIRALAX ) 17 GM/SCOOP powder Take 17 g by mouth daily as needed for mild constipation. 09/28/22   Aura Leeds Latif, DO  rifaximin  (XIFAXAN ) 550 MG TABS tablet Take 1 tablet (550 mg total) by mouth 2 (two) times daily. 09/26/23 01/24/24  Covington, Jamie R, NP  spironolactone  (ALDACTONE ) 50 MG tablet Take 1 tablet (50 mg total) by mouth daily. Patient not taking: Reported on 07/21/2023 09/29/22   Aura Leeds Latif, DO  traMADol  (ULTRAM ) 50 MG tablet Take 1 tablet (50 mg total) by mouth every 12 (twelve) hours as needed for moderate pain or severe pain. Patient not taking: Reported on 07/21/2023 09/28/22   Eveline Hipps, DO     Family History  Problem Relation Age of Onset   Osteoarthritis Mother    Cancer Father        cirrhosis; alcoholic   Cirrhosis Sister        alcoholic   Cirrhosis Brother        alcoholic    Social History   Socioeconomic History   Marital status: Married    Spouse name: Not on file   Number of children: Not on file   Years of education: Not on file   Highest education level: Not on file  Occupational History   Not on file  Tobacco Use   Smoking status: Never   Smokeless tobacco: Never  Vaping Use   Vaping status: Never Used  Substance and Sexual Activity   Alcohol use: No   Drug use: No   Sexual activity: Not on file  Other Topics Concern   Not on file  Social History Narrative   Lives in [place] with husband; never smoked; no current alcohol; prior ocassional alcohol. [facility] [place]   Social Drivers of Health   Financial Resource Strain: Low Risk  (04/27/2022)   [facility] prior to 09[facility]04/[facility]22[facility]   Overall Financial Resource Strain (CARDIA)    Difficulty of Paying Living Expenses: Not very hard  Food Insecurity: No Food Insecurity (07/28/2023)   Hunger Vital Sign     Worried About Running Out of Food in the Last Year: Never true    Ran Out of Food in the Last Year: Never true  Transportation Needs: No Transportation Needs (07/28/2023)   PRAPARE - Administrator, Civil Service (Medical): No    Lack of Transportation (Non-Medical): No  Physical Activity: Sufficiently Active (04/27/2022)   [facility] to 09/04/2022[facility], [facility] visits prior to 09/04/2022., [facility]   Exercise Vital Sign    Days of Exercise per Week: 5 days    Minutes of Exercise per Session: 60 min  Stress: Stress Concern Present (04/27/2022)   [facility] prior to 09/04/2022[facility], [facility] prior to 09/04/2022[facility]   [employer] of Occupational Health - Occupational Stress Questionnaire    Feeling of Stress : To some extent  Social  Connections: Moderately Isolated (07/28/2023)   Social Connection and Isolation Panel [NHANES]    Frequency of Communication with Friends and Family: More than three times a week    Frequency of Social Gatherings with Friends and Family: Three times a week    Attends Religious Services: Never    Active Member of Clubs or Organizations: No    Attends Banker Meetings: Never    Marital Status: Married     Review of Systems: A 12 point ROS discussed and pertinent positives are indicated in the HPI above.  All other systems are negative.  Review of Systems  Vital Signs: There were no vitals taken for this visit.  Physical Exam       Imaging: US  Abdomen Complete Result Date: 10/07/2023 CLINICAL DATA:  Cirrhosis of liver with ascites. EXAM: ABDOMEN ULTRASOUND COMPLETE COMPARISON:  CT abdomen and pelvis July 14, 2023 FINDINGS: Gallbladder: Multiple gallstones are identified largest measures 1 cm. No wall thickening visualized. No sonographic Murphy  sign noted by sonographer. Common bile duct: Diameter: 4.6 mm. Liver: No focal lesion. Increased echotexture. Nodular contour. Portal vein is patent on color Doppler imaging with normal direction of blood flow towards the liver. IVC: No abnormality visualized. Pancreas: Visualized portion unremarkable. Spleen: Spleen is normal in echotexture and measures 15 cm. Right Kidney: Length: 9.8 cm. Echogenicity within normal limits. No mass or hydronephrosis visualized. Left Kidney: Length: 9.2 cm. Echogenicity within normal limits. No mass or hydronephrosis visualized. Abdominal aorta: No aneurysm visualized. Other findings: None. IMPRESSION: 1. Cirrhotic liver. No focal liver lesion identified. 2. Cholelithiasis without sonographic evidence of acute cholecystitis. 3. Splenomegaly. Electronically Signed   By: Anna Barnes M.D.   On: 10/07/2023 09:36    Labs:  CBC: Recent Labs    05/29/23 1623 07/28/23 0755 07/29/23 0717 08/09/23 1646  WBC 3.6* 2.9* 9.4 5.9  HGB 11.7* 14.1 13.6 12.6  HCT 33.9* 42.1 38.5 36.8  PLT 65* 79* 89* 105*    COAGS: Recent Labs    07/28/23 0755 07/29/23 0717  INR 1.1 1.3*    BMP: Recent Labs    05/29/23 1623 07/14/23 1031 07/28/23 0755 07/29/23 0717 08/09/23 1646  NA 137  --  138 135 141  K 3.7  --  3.9 4.3 3.4*  CL 107  --  108 104 112*  CO2 20*  --  21* 20* 20*  GLUCOSE 140*  --  110* 170* 233*  BUN 20  --  22 15 10   CALCIUM 8.6*  --  8.9 8.6* 8.9  CREATININE 1.10* 1.00 1.18* 1.02* 0.83  GFRNONAA 54*  --  50* 60* >60    LIVER FUNCTION TESTS: Recent Labs    05/29/23 1623 07/28/23 0755 07/29/23 0717 08/09/23 1646  BILITOT 2.3* 1.4* 2.0* 3.4*  AST 35 38 264* 40  ALT 16 16 130* 45*  ALKPHOS 66 70 64 101  PROT 6.4* 7.0 6.3* 6.9  ALBUMIN  3.2* 3.2* 2.9* 3.3*    TUMOR MARKERS: No results for input(s): "AFPTM", "CEA", "CA199", "CHROMGRNA" in the last 8760 hours.  Assessment and Plan: Patient with past medical history of cirrhosis, recurrent  ascites presents with complaint of intermittent altered mentation after TIPS creation 07/28/23.   She has been followed as an outpatient with regular assessment of her TIPS status and associated symptoms.  Due to ongoing complaint of AMS, she is scheduled today for TIPS interrogation vs. Revision.   Patient presents today in their usual state of health.  She has been  NPO and is not currently on blood thinners.   Risks and benefits of TIPS, BRTO and/or additional variceal embolization were discussed with the patient and/or the patient's family including, but not limited to, infection, bleeding, damage to adjacent structures, worsening hepatic and/or cardiac function, worsening and/or the development of altered mental status/encephalopathy, non-target embolization and death.   This interventional procedure involves the use of X-rays and because of the nature of the planned procedure, it is possible that we will have prolonged use of X-ray fluoroscopy.  Potential radiation risks to you include (but are not limited to) the following: - A slightly elevated risk for cancer  several years later in life. This risk is typically less than 0.5% percent. This risk is low in comparison to the normal incidence of human cancer, which is 33% for women and 50% for men according to the American Cancer Society. - Radiation induced injury can include skin redness, resembling a rash, tissue breakdown / ulcers and hair loss (which can be temporary or permanent).   The likelihood of either of these occurring depends on the difficulty of the procedure and whether you are sensitive to radiation due to previous procedures, disease, or genetic conditions.   IF your procedure requires a prolonged use of radiation, you will be notified and given written instructions for further action.  It is your responsibility to monitor the irradiated area for the 2 weeks following the procedure and to notify your physician if you are  concerned that you have suffered a radiation induced injury.    All of the patient's questions were answered, patient is agreeable to proceed.  Consent signed and in chart.  Advance Care Plan: {Advance Care [id number]}     Thank you for this interesting consult.  I greatly enjoyed meeting [name] and look forward to participating in their care.  A copy of this report was sent to the requesting provider on this date.  Electronically Signed: Tamaiya Bump Sue-Ellen Anabelle Bungert, PA 10/26/2023, 4:37 PM   I spent a total of {New YNWG:[id number]} {New Out-Pt:[id number]}  {Established Out-Pt:[id number]} in face to face in clinical consultation, greater than 50% of which was counseling/coordinating care for ***

## 2023-10-27 ENCOUNTER — Encounter (HOSPITAL_COMMUNITY): Payer: Self-pay

## 2023-10-27 ENCOUNTER — Other Ambulatory Visit (HOSPITAL_COMMUNITY): Payer: Self-pay | Admitting: Interventional Radiology

## 2023-10-27 ENCOUNTER — Ambulatory Visit (HOSPITAL_COMMUNITY)
Admission: RE | Admit: 2023-10-27 | Discharge: 2023-10-27 | Disposition: A | Discharge status: Home / Self Care | Source: Ambulatory Visit | Attending: Interventional Radiology | Admitting: Interventional Radiology

## 2023-10-27 ENCOUNTER — Other Ambulatory Visit: Payer: Self-pay

## 2023-10-27 DIAGNOSIS — K7460 Unspecified cirrhosis of liver: Secondary | ICD-10-CM | POA: Diagnosis not present

## 2023-10-27 DIAGNOSIS — R188 Other ascites: Secondary | ICD-10-CM | POA: Diagnosis not present

## 2023-10-27 DIAGNOSIS — Z95828 Presence of other vascular implants and grafts: Secondary | ICD-10-CM

## 2023-10-27 DIAGNOSIS — Z7984 Long term (current) use of oral hypoglycemic drugs: Secondary | ICD-10-CM | POA: Insufficient documentation

## 2023-10-27 DIAGNOSIS — K7290 Hepatic failure, unspecified without coma: Secondary | ICD-10-CM

## 2023-10-27 DIAGNOSIS — Z79899 Other long term (current) drug therapy: Secondary | ICD-10-CM | POA: Insufficient documentation

## 2023-10-27 HISTORY — PX: IR TIPS REVISION MOD SED: IMG2296

## 2023-10-27 HISTORY — PX: IR US GUIDE VASC ACCESS RIGHT: IMG2390

## 2023-10-27 HISTORY — PX: IR TRANSHEPATIC PORTOGRAM W HEMO: IMG690

## 2023-10-27 LAB — COMPREHENSIVE METABOLIC PANEL WITH GFR
ALT: 19 U/L (ref 0–44)
AST: 31 U/L (ref 15–41)
Albumin: 2.8 g/dL — ABNORMAL LOW (ref 3.5–5.0)
Alkaline Phosphatase: 66 U/L (ref 38–126)
Anion gap: 10 (ref 5–15)
BUN: 15 mg/dL (ref 8–23)
CO2: 20 mmol/L — ABNORMAL LOW (ref 22–32)
Calcium: 9.5 mg/dL (ref 8.9–10.3)
Chloride: 107 mmol/L (ref 98–111)
Creatinine, Ser: 0.84 mg/dL (ref 0.44–1.00)
GFR, Estimated: >60 mL/min (ref >60)
Glucose, Bld: 357 mg/dL — ABNORMAL HIGH (ref 70–99)
Potassium: 3.9 mmol/L (ref 3.5–5.1)
Sodium: 137 mmol/L (ref 135–145)
Total Bilirubin: 3 mg/dL — ABNORMAL HIGH (ref 0.0–1.2)
Total Protein: 5.9 g/dL — ABNORMAL LOW (ref 6.5–8.1)

## 2023-10-27 LAB — CBC WITH DIFFERENTIAL/PLATELET
Abs Immature Granulocytes: 0.01 10*3/uL (ref 0.00–0.07)
Basophils Absolute: 0 10*3/uL (ref 0.0–0.1)
Basophils Relative: 1 %
Eosinophils Absolute: 0.1 10*3/uL (ref 0.0–0.5)
Eosinophils Relative: 2 %
HCT: 29.3 % — ABNORMAL LOW (ref 36.0–46.0)
Hemoglobin: 10.2 g/dL — ABNORMAL LOW (ref 12.0–15.0)
Immature Granulocytes: 1 %
Lymphocytes Relative: 28 %
Lymphs Abs: 0.6 10*3/uL — ABNORMAL LOW (ref 0.7–4.0)
MCH: 29.8 pg (ref 26.0–34.0)
MCHC: 34.8 g/dL (ref 30.0–36.0)
MCV: 85.7 fL (ref 80.0–100.0)
Monocytes Absolute: 0.2 10*3/uL (ref 0.1–1.0)
Monocytes Relative: 11 %
Neutro Abs: 1.3 10*3/uL — ABNORMAL LOW (ref 1.7–7.7)
Neutrophils Relative %: 57 %
Platelets: 42 10*3/uL — ABNORMAL LOW (ref 150–400)
RBC: 3.42 MIL/uL — ABNORMAL LOW (ref 3.87–5.11)
RDW: 14.5 % (ref 11.5–15.5)
WBC: 2.2 10*3/uL — ABNORMAL LOW (ref 4.0–10.5)
nRBC: 0 % (ref 0.0–0.2)

## 2023-10-27 LAB — GLUCOSE, CAPILLARY
Glucose-Capillary: 333 mg/dL — ABNORMAL HIGH (ref 70–99)
Glucose-Capillary: 296 mg/dL — ABNORMAL HIGH (ref 70–99)
Glucose-Capillary: 207 mg/dL — ABNORMAL HIGH (ref 70–99)

## 2023-10-27 LAB — HEMOGLOBIN A1C
Hgb A1c MFr Bld: 9.2 % — ABNORMAL HIGH (ref 4.8–5.6)
Mean Plasma Glucose: 217.34 mg/dL

## 2023-10-27 LAB — PROTIME-INR
INR: 1.3 — ABNORMAL HIGH (ref 0.8–1.2)
Prothrombin Time: 16.8 s — ABNORMAL HIGH (ref 11.4–15.2)

## 2023-10-27 MED ORDER — LIDOCAINE-EPINEPHRINE 1 %-1:100000 IJ SOLN
INTRAMUSCULAR | Status: AC
  Filled 2023-10-27: qty 1

## 2023-10-27 MED ORDER — FENTANYL CITRATE (PF) 100 MCG/2ML IJ SOLN
INTRAMUSCULAR | Status: AC
  Filled 2023-10-27: qty 2

## 2023-10-27 MED ORDER — INSULIN ASPART 100 UNIT/ML IJ SOLN
0.0000 [IU] | Freq: Three times a day (TID) | INTRAMUSCULAR | Status: DC
  Administered 2023-10-27: 7 [IU] via SUBCUTANEOUS
  Filled 2023-10-27: qty 1

## 2023-10-27 MED ORDER — MIDAZOLAM HCL 2 MG/2ML IJ SOLN
INTRAMUSCULAR | Status: AC | PRN
Start: 2023-10-27 — End: 2023-10-27
  Administered 2023-10-27: .5 mg via INTRAVENOUS
  Administered 2023-10-27: 1 mg via INTRAVENOUS

## 2023-10-27 MED ORDER — SODIUM CHLORIDE 0.9 % IV SOLN: INTRAVENOUS | Status: DC

## 2023-10-27 MED ORDER — ONDANSETRON HCL 4 MG/2ML IJ SOLN
INTRAMUSCULAR | Status: AC | PRN
  Administered 2023-10-27: 4 mg via INTRAVENOUS

## 2023-10-27 MED ORDER — FENTANYL CITRATE (PF) 100 MCG/2ML IJ SOLN
INTRAMUSCULAR | Status: AC | PRN
  Administered 2023-10-27: 50 ug via INTRAVENOUS
  Administered 2023-10-27: 25 ug via INTRAVENOUS

## 2023-10-27 MED ORDER — ONDANSETRON HCL 4 MG/2ML IJ SOLN
INTRAMUSCULAR | Status: AC
  Filled 2023-10-27: qty 2

## 2023-10-27 MED ORDER — MIDAZOLAM HCL 2 MG/2ML IJ SOLN
INTRAMUSCULAR | Status: AC
Start: 2023-10-27 — End: ?
  Filled 2023-10-27: qty 2

## 2023-10-27 NOTE — Procedures (Signed)
 Interventional Radiology Procedure Note  Procedure: TIPS check  Findings: Please refer to procedural dictation for full description. Patent TIPS.  Mean pressure measurements (mmHg): RA = 24 MVP = 40 Portosystemic gradient = 16  Complications: None immediate  Estimated Blood Loss: < 5 mL  Recommendations: Will attempt to initiate rifaximin  once again to control intermittent hepatic encephalopathy.  IR will arrange for outpatient follow up in 3 months.   Creasie Doctor, MD

## 2023-12-19 DIAGNOSIS — R42 Dizziness and giddiness: Secondary | ICD-10-CM | POA: Diagnosis not present

## 2023-12-19 DIAGNOSIS — K7460 Unspecified cirrhosis of liver: Secondary | ICD-10-CM | POA: Diagnosis not present

## 2023-12-19 DIAGNOSIS — I8510 Secondary esophageal varices without bleeding: Secondary | ICD-10-CM | POA: Diagnosis not present

## 2023-12-19 DIAGNOSIS — R188 Other ascites: Secondary | ICD-10-CM | POA: Diagnosis not present

## 2023-12-19 DIAGNOSIS — Z95828 Presence of other vascular implants and grafts: Secondary | ICD-10-CM | POA: Diagnosis not present

## 2023-12-19 DIAGNOSIS — Z1211 Encounter for screening for malignant neoplasm of colon: Secondary | ICD-10-CM | POA: Diagnosis not present

## 2024-03-15 ENCOUNTER — Other Ambulatory Visit: Payer: Self-pay | Admitting: Internal Medicine

## 2024-04-04 ENCOUNTER — Encounter (HOSPITAL_COMMUNITY): Payer: Self-pay | Admitting: Internal Medicine

## 2024-04-04 NOTE — Progress Notes (Addendum)
Attempted to obtain medical history via telephone, unable to reach at this time. Unable to leave voicemail to return pre surgical testing department's phone call,due to mailbox full.  

## 2024-04-09 NOTE — Progress Notes (Signed)
 Attempted pre call for patient 10/1 with no success, attempted pre call again 10/6 and did get in touch with patient. She states she never received her prep and that she doesn't have the prep medication. She stated she has eaten breakfast (oatmeal) and lunch (avocado sandwich at 2.I told her I would call the office and let them know, she may still be able to have EGD. Called office and spoke with Inocente and she said she would call patient.

## 2024-04-10 ENCOUNTER — Other Ambulatory Visit: Payer: Self-pay

## 2024-04-10 ENCOUNTER — Encounter (HOSPITAL_COMMUNITY)
Admission: RE | Disposition: A | Discharge status: Home / Self Care | Payer: Self-pay | Source: Home / Self Care | Attending: Internal Medicine

## 2024-04-10 ENCOUNTER — Ambulatory Visit (HOSPITAL_BASED_OUTPATIENT_CLINIC_OR_DEPARTMENT_OTHER): Admitting: Anesthesiology

## 2024-04-10 ENCOUNTER — Ambulatory Visit (HOSPITAL_COMMUNITY)
Admission: RE | Admit: 2024-04-10 | Discharge: 2024-04-10 | Disposition: A | Discharge status: Home / Self Care | Attending: Internal Medicine | Admitting: Internal Medicine

## 2024-04-10 ENCOUNTER — Encounter (HOSPITAL_COMMUNITY): Payer: Self-pay | Admitting: Internal Medicine

## 2024-04-10 ENCOUNTER — Ambulatory Visit (HOSPITAL_COMMUNITY): Admitting: Anesthesiology

## 2024-04-10 DIAGNOSIS — Z1211 Encounter for screening for malignant neoplasm of colon: Secondary | ICD-10-CM | POA: Insufficient documentation

## 2024-04-10 DIAGNOSIS — I10 Essential (primary) hypertension: Secondary | ICD-10-CM | POA: Diagnosis not present

## 2024-04-10 DIAGNOSIS — K644 Residual hemorrhoidal skin tags: Secondary | ICD-10-CM | POA: Insufficient documentation

## 2024-04-10 DIAGNOSIS — K3189 Other diseases of stomach and duodenum: Secondary | ICD-10-CM | POA: Insufficient documentation

## 2024-04-10 DIAGNOSIS — I8500 Esophageal varices without bleeding: Secondary | ICD-10-CM

## 2024-04-10 DIAGNOSIS — K31A15 Gastric intestinal metaplasia without dysplasia, involving multiple sites: Secondary | ICD-10-CM | POA: Diagnosis not present

## 2024-04-10 DIAGNOSIS — K7460 Unspecified cirrhosis of liver: Secondary | ICD-10-CM | POA: Insufficient documentation

## 2024-04-10 DIAGNOSIS — K766 Portal hypertension: Secondary | ICD-10-CM | POA: Diagnosis not present

## 2024-04-10 HISTORY — PX: ESOPHAGEAL BANDING: SHX5518

## 2024-04-10 HISTORY — PX: COLONOSCOPY: SHX5424

## 2024-04-10 LAB — GLUCOSE, CAPILLARY: Glucose-Capillary: 151 mg/dL — ABNORMAL HIGH (ref 70–99)

## 2024-04-10 SURGERY — COLONOSCOPY
Anesthesia: Monitor Anesthesia Care

## 2024-04-10 MED ORDER — PROPOFOL 500 MG/50ML IV EMUL
INTRAVENOUS | Status: DC | PRN
  Administered 2024-04-10: 150 ug/kg/min via INTRAVENOUS

## 2024-04-10 MED ORDER — SODIUM CHLORIDE 0.9 % IV SOLN
INTRAVENOUS | Status: AC | PRN
  Administered 2024-04-10: 500 mL via INTRAMUSCULAR

## 2024-04-10 MED ORDER — PROPOFOL 10 MG/ML IV BOLUS
INTRAVENOUS | Status: DC | PRN
  Administered 2024-04-10: 40 mg via INTRAVENOUS

## 2024-04-10 MED ORDER — LIDOCAINE 2% (20 MG/ML) 5 ML SYRINGE
INTRAMUSCULAR | Status: DC | PRN
  Administered 2024-04-10: 80 mg via INTRAVENOUS

## 2024-04-10 MED ORDER — PROPOFOL 1000 MG/100ML IV EMUL
INTRAVENOUS | Status: AC
  Filled 2024-04-10: qty 100

## 2024-04-10 MED ORDER — EPHEDRINE SULFATE-NACL 50-0.9 MG/10ML-% IV SOSY
PREFILLED_SYRINGE | INTRAVENOUS | Status: DC | PRN
  Administered 2024-04-10 (×2): 5 mg via INTRAVENOUS

## 2024-04-10 NOTE — Anesthesia Postprocedure Evaluation (Signed)
 Anesthesia Post Note  Patient: [name]  Procedure(s) Performed: COLONOSCOPY ESOPHAGOSCOPY, WITH ESOPHAGEAL VARICES BAND LIGATION     Patient location during evaluation: Endoscopy Anesthesia Type: MAC Level of consciousness: awake and alert Pain management: pain level controlled Vital Signs Assessment: post-procedure vital signs reviewed and stable Respiratory status: spontaneous breathing, nonlabored ventilation, respiratory function stable and patient connected to nasal cannula oxygen Cardiovascular status: blood pressure returned to baseline and stable Postop Assessment: no apparent nausea or vomiting Anesthetic complications: no   No notable events documented.  Last Vitals:  Vitals:   04/10/24 1140 04/10/24 1145  BP: (!) 114/58   Pulse: (!) 55 (!) 56  Resp: (!) 24 16  Temp:    SpO2: 100% 99%    Last Pain:  Vitals:   04/10/24 1125  TempSrc:   PainSc: 0-No pain                 Rome Ade

## 2024-04-10 NOTE — Op Note (Signed)
 [facility] Patient Name: [name] Procedure Date: 04/10/2024 MRN: [id number] Attending MD: [name] DO, DO, [id number] Date of Birth: 09-17-1955 CSN: [id number] Age: 68 Admit Type: Outpatient Procedure:                Colonoscopy Indications:              Screening for colorectal malignant neoplasm Providers:                [name] DO, DO, Almarie Masters, RN,                            Haskel Chris, Technician Referring MD:              Medicines:                See the Anesthesia note for documentation of the                            administered medications Complications:            No immediate complications. Estimated Blood Loss:     Estimated blood loss: none. Procedure:                Pre-Anesthesia Assessment:                           - ASA Grade Assessment: III - A patient with severe                            systemic disease.                           - The risks and benefits of the procedure and the                            sedation options and risks were discussed with the                            patient. All questions were answered and informed                            consent was obtained.                           After obtaining informed consent, the colonoscope                            was passed under direct vision. Throughout the                            procedure, the patient's blood pressure, pulse, and                            oxygen saturations were monitored continuously. The                            [id number]-[id number] ([id number]) Olympus colonscope was  introduced through the anus and advanced to the the                            cecum, identified by the ileocecal valve. The                            colonoscopy was somewhat difficult due to poor                            bowel prep with stool present. Successful                            completion of the procedure was aided by  lavage.                            The quality of the bowel preparation was evaluated                            using the BBPS [facility] Bowel Preparation Scale)                            with scores of: Right Colon = 1 (portion of mucosa                            seen, but other areas not well seen due to                            staining, residual stool and/or opaque liquid),                            Transverse Colon = 2 (minor amount of residual                            staining, small fragments of stool and/or opaque                            liquid, but mucosa seen well) and Left Colon = 1                            (portion of mucosa seen, but other areas not well                            seen due to staining, residual stool and/or opaque                            liquid). The total BBPS score equals 4. The quality                            of the bowel preparation was inadequate. Scope In: 10:31:12 AM Scope Out: 10:56:52 AM Scope Withdrawal Time: 0 hours 7 minutes 32 seconds  Total Procedure Duration: 0 hours 25 minutes 40 seconds  Findings:  The digital rectal exam findings include non-thrombosed external       hemorrhoids.      A moderate amount of stool was found in the entire colon, precluding       visualization. Lavage of the area was performed using a moderate amount,       resulting in incomplete clearance with continued poor visualization. Impression:               - Preparation of the colon was inadequate.                           - Non-thrombosed external hemorrhoids found on                            digital rectal exam.                           - Stool in the entire examined colon.                           - No specimens collected. Moderate Sedation:      Monitored anesthesia care provided by anesthesia department. Recommendation:           - Discharge patient to home.                           - Resume previous diet.                           -  Continue present medications.                           - Repeat colonoscopy in 1 year for screening                            purposes.                           - Return to my office in 2 months. Procedure Code(s):        --- Professional ---                           [phone], Colonoscopy, flexible; diagnostic, including                            collection of specimen(s) by brushing or washing,                            when performed (separate procedure) Diagnosis Code(s):        --- Professional ---                           Z12.11, Encounter for screening for malignant                            neoplasm of colon  K64.4, Residual hemorrhoidal skin tags CPT copyright 2022 American Medical Association. All rights reserved. The codes documented in this report are preliminary and upon coder review may  be revised to meet current compliance requirements. Dr [name], DO [name] DO, DO 04/10/2024 11:24:23 AM Number of Addenda: 0

## 2024-04-10 NOTE — H&P (Signed)
 [facility] H&P  Subjective: [name] is a 68 y.o. female who presents for EGD and colonoscopy. 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 Last EGD 09/27/22 with grade I esophageal varices, portal hypertensive gastropathy, intestinal metaplasia (indefinite for dysplasia). Last colonoscopy reportedly ~ 11 years prior.   Today, denied chest pain, shortness of breath, abdominal pain, blood per rectum. No nausea or vomiting. Tolerated prep, though per communication from office staff patient had eaten some solid food yesterday.   Objective: General: Awake and alert, non-toxic in appearance Cardio: Regular rate and rhythm  Pulm: Clear to auscultation, no conversational dyspnea Abdomen: Soft, non-tender to palpation, bowel sounds appreciated    Assessment:  Esophageal varices Colon cancer screening  Plan:  -Recommend EGD and colonoscopy, discussed benefits, alternatives and risks of procedures including bleeding/infection/perforation/missed lesion/anesthesia, she verbalized understanding and elected to proceed -Further recommendations to follow pending procedures  Kirsten Keas, DO [facility] Gastroenterology

## 2024-04-10 NOTE — Anesthesia Preprocedure Evaluation (Signed)
 Anesthesia Evaluation  Patient identified by MRN, date of birth, ID band Patient awake    Reviewed: Allergy & Precautions, NPO status , Patient's Chart, lab work & pertinent test results  History of Anesthesia Complications Negative for: history of anesthetic complications  Airway Mallampati: III  TM Distance: >3 FB Neck ROM: Full  Mouth opening: Limited Mouth Opening  Dental  (+) Edentulous Upper, Edentulous Lower   Pulmonary sleep apnea , neg COPD, Patient abstained from smoking.Not current smoker   Pulmonary exam normal breath sounds clear to auscultation       Cardiovascular Exercise Tolerance: Good METShypertension, Pt. on medications (-) CAD and (-) Past MI Normal cardiovascular exam(-) dysrhythmias  Rhythm:Regular Rate:Normal  ECHO 1/25   1. Left ventricular ejection fraction, by estimation, is 60 to 65%. The  left ventricle has normal function. The left ventricle has no regional  wall motion abnormalities. There is mild concentric left ventricular  hypertrophy. Left ventricular diastolic  parameters are consistent with Grade I diastolic dysfunction (impaired  relaxation).   2. Right ventricular systolic function is normal. The right ventricular  size is normal. There is normal pulmonary artery systolic pressure.   3. Left atrial size was severely dilated.   4. Right atrial size was moderately dilated.   5. The mitral valve is normal in structure. Mild mitral valve  regurgitation. No evidence of mitral stenosis.   6. Tricuspid valve regurgitation is mild to moderate.   7. The aortic valve is tricuspid. Aortic valve regurgitation is mild.  Aortic valve sclerosis/calcification is present, without any evidence of  aortic stenosis.      Neuro/Psych negative neurological ROS  negative psych ROS   GI/Hepatic ,neg GERD  ,,(+) Cirrhosis   Esophageal Varices  (-) substance abuse  Last scope 2020 had many bands S/p  TIPS    Endo/Other  negative endocrine ROSneg diabetes    Renal/GU negative Renal ROS  negative genitourinary   Musculoskeletal  (+) Arthritis , Osteoarthritis,    Abdominal   Peds  Hematology  (+) Blood dyscrasia, anemia Hb 10.6, plt 73   Anesthesia Other Findings Past Medical History: No date: Arthritis     Comment:  hands, and back No date: Hypertension 03/02/2019: Iron  deficiency anemia due to chronic blood loss No date: Irritable bowel No date: Liver cirrhosis (HCC) No date: Sleep apnea     Comment:  mild, pt unable to tolerate CPAP  Reproductive/Obstetrics negative OB ROS                              Anesthesia Physical Anesthesia Plan  ASA: 3  Anesthesia Plan: MAC   Post-op Pain Management: Minimal or no pain anticipated   Induction: Intravenous  PONV Risk Score and Plan: 1 and Propofol  infusion, TIVA and Ondansetron   Airway Management Planned: Nasal Cannula  Additional Equipment: None  Intra-op Plan:   Post-operative Plan:   Informed Consent: I have reviewed the patients History and Physical, chart, labs and discussed the procedure including the risks, benefits and alternatives for the proposed anesthesia with the patient or authorized representative who has indicated his/her understanding and acceptance.     Dental advisory given  Plan Discussed with: CRNA and Surgeon  Anesthesia Plan Comments: (Discussed risks of anesthesia with patient, including possibility of difficulty with spontaneous ventilation under anesthesia necessitating airway intervention, PONV, and rare risks such as cardiac or respiratory or neurological events, and allergic reactions. Discussed the role of CRNA  in patient's perioperative care. Patient understands.)         Anesthesia Quick Evaluation

## 2024-04-10 NOTE — Op Note (Signed)
 [facility] Patient Name: [name] Procedure Date: 04/10/2024 MRN: [id number] Attending MD: [name] DO, DO, [id number] Date of Birth: 01-25-1956 CSN: [id number] Age: 68 Admit Type: Outpatient Procedure:                Upper GI endoscopy Indications:              Follow-up of esophageal varices, Follow-up of                            intestinal metaplasia, Status post TIPS procedure                            07/2023. Providers:                [name] DO, DO, Almarie Masters, RN,                            Haskel Chris, Technician Referring MD:              Medicines:                See the Anesthesia note for documentation of the                            administered medications Complications:            No immediate complications. Estimated Blood Loss:     Estimated blood loss was minimal. Procedure:                Pre-Anesthesia Assessment:                           - ASA Grade Assessment: III - A patient with severe                            systemic disease.                           - The risks and benefits of the procedure and the                            sedation options and risks were discussed with the                            patient. All questions were answered and informed                            consent was obtained.                           After obtaining informed consent, the endoscope was                            passed under direct vision. Throughout the                            procedure, the patient's blood pressure, pulse, and  oxygen saturations were monitored continuously. The                            [id number]-[id number] ([id number]) Olympus endoscope was introduced                            through the mouth, and advanced to the second part                            of duodenum. The upper GI endoscopy was                            accomplished without difficulty. The patient                             tolerated the procedure well. Scope In: Scope Out: Findings:      There is no endoscopic evidence of varices in the entire esophagus.      Mild portal hypertensive gastropathy was found in the entire examined       stomach. Five biopsies were obtained on the greater curvature of the       gastric body, on the lesser curvature of the gastric body, at the       incisura, on the greater curvature of the gastric antrum and on the       lesser curvature of the gastric antrum with cold forceps for Follow up       gastric intestinal metaplasia.      The examined duodenum was normal. Impression:               - Portal hypertensive gastropathy.                           - Normal examined duodenum.                           - Five biopsies were obtained on the greater                            curvature of the gastric body, on the lesser                            curvature of the gastric body, at the incisura, on                            the greater curvature of the gastric antrum and on                            the lesser curvature of the gastric antrum. Moderate Sedation:      Monitored anesthesia care provided by anesthesia department. Recommendation:           - Resume previous diet.                           - Continue present medications.                           -  Await pathology results.                           - Repeat upper endoscopy after studies are complete                            for surveillance based on pathology results.                           - Perform a colonoscopy today. Procedure Code(s):        --- Professional ---                           [phone], Esophagogastroduodenoscopy, flexible,                            transoral; with biopsy, single or multiple Diagnosis Code(s):        --- Professional ---                           K76.6, Portal hypertension                           K31.89, Other diseases of stomach and duodenum                            I85.00, Esophageal varices without bleeding                           K31.A0, Gastric intestinal metaplasia, unspecified CPT copyright 2022 [employer]. All rights reserved. The codes documented in this report are preliminary and upon coder review may  be revised to meet current compliance requirements. Dr [name], DO [name] DO, DO 04/10/2024 11:18:51 AM Number of Addenda: 0

## 2024-04-10 NOTE — Transfer of Care (Signed)
 Immediate Anesthesia Transfer of Care Note  Patient: [name]  Procedure(s) Performed: COLONOSCOPY ESOPHAGOSCOPY, WITH ESOPHAGEAL VARICES BAND LIGATION  Patient Location: PACU  Anesthesia Type:MAC  Level of Consciousness: awake, alert , and oriented  Airway & Oxygen Therapy: Patient Spontanous Breathing and Patient connected to face mask oxygen  Post-op Assessment: Report given to RN and Post -op Vital signs reviewed and stable  Post vital signs: Reviewed and stable  Last Vitals:  Vitals Value Taken Time  BP 93/52 04/10/24 11:05  Temp    Pulse 53 04/10/24 11:07  Resp 14 04/10/24 11:07  SpO2 100 % 04/10/24 11:07  Vitals shown include unfiled device data.  Last Pain:  Vitals:   04/10/24 0917  TempSrc: Temporal  PainSc: 0-No pain         Complications: No notable events documented.

## 2024-04-10 NOTE — Discharge Instructions (Signed)

## 2024-04-11 ENCOUNTER — Encounter (HOSPITAL_COMMUNITY): Payer: Self-pay | Admitting: Internal Medicine

## 2024-04-11 LAB — SURGICAL PATHOLOGY

## 2024-04-25 ENCOUNTER — Other Ambulatory Visit (HOSPITAL_COMMUNITY): Payer: Self-pay | Admitting: Internal Medicine

## 2024-04-25 DIAGNOSIS — K7460 Unspecified cirrhosis of liver: Secondary | ICD-10-CM

## 2024-05-04 ENCOUNTER — Ambulatory Visit (HOSPITAL_COMMUNITY)
Admission: RE | Admit: 2024-05-04 | Discharge: 2024-05-04 | Disposition: A | Discharge status: Home / Self Care | Source: Ambulatory Visit | Attending: Internal Medicine | Admitting: Internal Medicine

## 2024-05-04 ENCOUNTER — Encounter (HOSPITAL_COMMUNITY): Payer: Self-pay

## 2024-05-04 DIAGNOSIS — K7460 Unspecified cirrhosis of liver: Secondary | ICD-10-CM
